# Patient Record
Sex: Female | Born: 1965
Health system: Southern US, Community
[De-identification: ages and names within clinical notes are randomized; demographics above are authoritative.]

## PROBLEM LIST (undated history)

## (undated) DIAGNOSIS — Z720 Tobacco use: Secondary | ICD-10-CM

## (undated) DIAGNOSIS — I639 Cerebral infarction, unspecified: Secondary | ICD-10-CM

## (undated) DIAGNOSIS — B1920 Unspecified viral hepatitis C without hepatic coma: Secondary | ICD-10-CM

## (undated) DIAGNOSIS — F1910 Other psychoactive substance abuse, uncomplicated: Secondary | ICD-10-CM

## (undated) HISTORY — PX: NO PAST SURGERIES: SHX2092

## (undated) HISTORY — DX: Unspecified viral hepatitis C without hepatic coma: B19.20

---

## 2013-01-02 ENCOUNTER — Emergency Department (HOSPITAL_COMMUNITY): Payer: Self-pay

## 2013-01-02 ENCOUNTER — Encounter (HOSPITAL_COMMUNITY): Payer: Self-pay | Admitting: *Deleted

## 2013-01-02 ENCOUNTER — Emergency Department (HOSPITAL_COMMUNITY)
Admission: EM | Admit: 2013-01-02 | Discharge: 2013-01-02 | Disposition: A | Discharge status: Home / Self Care | Payer: Self-pay | Attending: Emergency Medicine | Admitting: Emergency Medicine

## 2013-01-02 DIAGNOSIS — S4980XA Other specified injuries of shoulder and upper arm, unspecified arm, initial encounter: Secondary | ICD-10-CM | POA: Insufficient documentation

## 2013-01-02 DIAGNOSIS — Y929 Unspecified place or not applicable: Secondary | ICD-10-CM | POA: Insufficient documentation

## 2013-01-02 DIAGNOSIS — Y939 Activity, unspecified: Secondary | ICD-10-CM | POA: Insufficient documentation

## 2013-01-02 DIAGNOSIS — S46001A Unspecified injury of muscle(s) and tendon(s) of the rotator cuff of right shoulder, initial encounter: Secondary | ICD-10-CM

## 2013-01-02 DIAGNOSIS — F17200 Nicotine dependence, unspecified, uncomplicated: Secondary | ICD-10-CM | POA: Insufficient documentation

## 2013-01-02 DIAGNOSIS — M25511 Pain in right shoulder: Secondary | ICD-10-CM

## 2013-01-02 DIAGNOSIS — S46909A Unspecified injury of unspecified muscle, fascia and tendon at shoulder and upper arm level, unspecified arm, initial encounter: Secondary | ICD-10-CM | POA: Insufficient documentation

## 2013-01-02 DIAGNOSIS — M25519 Pain in unspecified shoulder: Secondary | ICD-10-CM | POA: Insufficient documentation

## 2013-01-02 DIAGNOSIS — X58XXXA Exposure to other specified factors, initial encounter: Secondary | ICD-10-CM | POA: Insufficient documentation

## 2013-01-02 MED ORDER — IBUPROFEN 400 MG PO TABS
800.0000 mg | ORAL_TABLET | Freq: Once | ORAL | Status: AC
  Administered 2013-01-02: 800 mg via ORAL
  Filled 2013-01-02: qty 2

## 2013-01-02 NOTE — ED Notes (Signed)
The pt just went to  xray 

## 2013-01-02 NOTE — Discharge Instructions (Signed)
Be sure to rest and ice your shoulder. Use the sling for comfort. Take ibuprofen between 608 100 mg every 6-8 hours. Followup with [facility]. Your x-rays today was normal, increasing my suspicion for rotator cuff injury.  Rotator Cuff Injury The rotator cuff is the collective set of muscles and tendons that make up the stabilizing unit of your shoulder. This unit holds in the ball of the humerus (upper arm bone) in the socket of the scapula (shoulder blade). Injuries to this stabilizing unit most commonly come from sports or activities that cause the arm to be moved repeatedly over the head. Examples of this include throwing, weight lifting, swimming, racquet sports, or an injury such as falling on your arm. Chronic (longstanding) irritation of this unit can cause inflammation (soreness), bursitis, and eventual damage to the tendons to the point of rupture (tear). An acute (sudden) injury of the rotator cuff can result in a partial or complete tear. You may need surgery with complete tears. Small or partial rotator cuff tears may be treated conservatively with temporary immobilization, exercises and rest. Physical therapy may be needed. HOME CARE INSTRUCTIONS   Apply ice to the injury for 15-20 minutes 3-4 times per day for the first 2 days. Put the ice in a plastic bag and place a towel between the bag of ice and your skin.  If you have a shoulder immobilizer (sling and straps), do not remove it for as long as directed by your caregiver or until you see a caregiver for a follow-up examination. If you need to remove it, move your arm as little as possible.  You may want to sleep on several pillows or in a recliner at night to lessen swelling and pain.  Only take over-the-counter or prescription medicines for pain, discomfort, or fever as directed by your caregiver.  Do simple hand squeezing exercises with a soft rubber ball to decrease hand swelling. SEEK MEDICAL CARE IF:   Pain in your  shoulder increases or new pain or numbness develops in your arm, hand, or fingers.  Your hand or fingers are colder than your other hand. SEEK IMMEDIATE MEDICAL CARE IF:   Your arm, hand, or fingers are numb or tingling.  Your arm, hand, or fingers are increasingly swollen and painful, or turn white or blue. Document Released: 06/16/2000 Document Revised: 09/11/2011 Document Reviewed: 06/09/2008 [facility] Patient Information 2014 [place], [place].  Shoulder Pain The shoulder is the joint that connects your arms to your body. The bones that form the shoulder joint include the upper arm bone (humerus), the shoulder blade (scapula), and the collarbone (clavicle). The top of the humerus is shaped like a ball and fits into a rather flat socket on the scapula (glenoid cavity). A combination of muscles and strong, fibrous tissues that connect muscles to bones (tendons) support your shoulder joint and hold the ball in the socket. Small, fluid-filled sacs (bursae) are located in different areas of the joint. They act as cushions between the bones and the overlying soft tissues and help reduce friction between the gliding tendons and the bone as you move your arm. Your shoulder joint allows a wide range of motion in your arm. This range of motion allows you to do things like scratch your back or throw a ball. However, this range of motion also makes your shoulder more prone to pain from overuse and injury. Causes of shoulder pain can originate from both injury and overuse and usually can be grouped in the following four  categories:  Redness, swelling, and pain (inflammation) of the tendon (tendinitis) or the bursae (bursitis).  Instability, such as a dislocation of the joint.  Inflammation of the joint (arthritis).  Broken bone (fracture). HOME CARE INSTRUCTIONS   Apply ice to the sore area.  Put ice in a plastic bag.  Place a towel between your skin and the bag.  Leave the ice on for 15-20  minutes, 3-4 times per day for the first 2 days.  Stop using cold packs if they do not help with the pain.  If you have a shoulder sling or immobilizer, wear it as long as your caregiver instructs. Only remove it to shower or bathe. Move your arm as little as possible, but keep your hand moving to prevent swelling.  Squeeze a soft ball or foam pad as much as possible to help prevent swelling.  Only take over-the-counter or prescription medicines for pain, discomfort, or fever as directed by your caregiver. SEEK MEDICAL CARE IF:   Your shoulder pain increases, or new pain develops in your arm, hand, or fingers.  Your hand or fingers become cold and numb.  Your pain is not relieved with medicines. SEEK IMMEDIATE MEDICAL CARE IF:   Your arm, hand, or fingers are numb or tingling.  Your arm, hand, or fingers are significantly swollen or turn white or blue. MAKE SURE YOU:   Understand these instructions.  Will watch your condition.  Will get help right away if you are not doing well or get worse. Document Released: 03/29/2005 Document Revised: 03/13/2012 Document Reviewed: 06/03/2011 [facility] Patient Information 2014 [place], [place].

## 2013-01-02 NOTE — ED Notes (Signed)
The pt has now gone to xray

## 2013-01-02 NOTE — ED Notes (Signed)
Pt waiting on xray  To be done

## 2013-01-02 NOTE — ED Provider Notes (Signed)
History  This chart was scribed for non-physician practitioner working with Flint Melter, MD by Greggory Stallion, ED scribe. This patient was seen in room TR05C/TR05C and the patient's care was started at 6:10 PM.  CSN: [id number] Arrival date & time 01/02/13  1801   Chief Complaint  Patient presents with  . Shoulder Pain   The history is provided by the patient. No language interpreter was used.    HPI Comments: Diana Harris is a 47 y.o. female who presents to the Emergency Department complaining of sharp right shoulder pain that started this morning that radiates down her arm. Pt rates her pain 10/10. She states the pain is worsened by movement. No known injury or trauma. She has not dont anything specific out of her normal. No heavy lifting. Pt denies CP, shortness of breath, lightheadedness, dizziness. She states she has taken Aleve for pain with no relief.  History reviewed. No pertinent past medical history. History reviewed. No pertinent past surgical history. History reviewed. No pertinent family history. History  Substance Use Topics  . Smoking status: Current Every Day Smoker    Types: Cigarettes  . Smokeless tobacco: Not on file  . Alcohol Use: Yes     Comment: occ beer   OB History   Grav Para Term Preterm Abortions TAB SAB Ect Mult Living                 Review of Systems  A complete 10 system review of systems was obtained and all systems are negative except as noted in the HPI and PMH.   Allergies  Review of patient's allergies indicates no known allergies.  Home Medications  No current outpatient prescriptions on file.  BP 131/82  Pulse 83  Temp(Src) 98.1 F (36.7 C) (Oral)  Resp 18  SpO2 98%  Physical Exam  Nursing note and vitals reviewed. Constitutional: She is oriented to person, place, and time. She appears well-developed and well-nourished. No distress.  HENT:  Head: Normocephalic and atraumatic.  Mouth/Throat: Oropharynx is clear and  moist.  Eyes: Conjunctivae and EOM are normal.  Neck: Normal range of motion. Neck supple.  Cardiovascular: Normal rate, regular rhythm, normal heart sounds and intact distal pulses.   Pulmonary/Chest: Effort normal and breath sounds normal. No respiratory distress.  Musculoskeletal: Normal range of motion. She exhibits no edema.  Generalized tenderness to palpation through right shoulder. Marked tenderness to insertion of biceps tendon. Positive impingement sign. Decreased ROM with flexion and abduction limited to 45 degrees due to pain. Equal strength, 5/5. Sensation intact. No deformity.   Neurological: She is alert and oriented to person, place, and time. No sensory deficit.  Skin: Skin is warm and dry.  Psychiatric: She has a normal mood and affect. Her behavior is normal.    ED Course  Procedures (including critical care time)  DIAGNOSTIC STUDIES: Oxygen Saturation is 98% on RA, normal by my interpretation.    COORDINATION OF CARE: 6:13 PM-Discussed treatment plan which includes shoulder xray with pt at bedside and pt agreed to plan.   Labs Reviewed - No data to display Dg Shoulder Right  01/02/2013   *RADIOLOGY REPORT*  Clinical Data: Right shoulder pain without known injury.  RIGHT SHOULDER - 2+ VIEW  Comparison: None.  Findings: Imaged bones, joints and soft tissues appear normal.  IMPRESSION: Normal study.   Original Report Authenticated By: Holley Dexter, M.D.   1. Shoulder pain, right   2. Rotator cuff injury, right, initial encounter  MDM  Right shoulder pain- worse with movement. Positive impingement sign. Generalized tenderness throughout right shoulder girdle. X-ray without any acute abnormality. Sling given for comfort. Advised her to rest and is her shoulder along with ibuprofen. She will followup with orthopedics for possible rotator cuff injury.       I personally performed the services described in this documentation, which was scribed in my presence.  The recorded information has been reviewed and is accurate.   Trevor Mace, PA-C 01/02/13 1942

## 2013-01-02 NOTE — ED Notes (Signed)
Pt reports waking up with right shoulder pain, increases with movement of her arm, denies injury.

## 2013-01-03 NOTE — ED Provider Notes (Signed)
Medical screening examination/treatment/procedure(s) were performed by non-physician practitioner and as supervising physician I was immediately available for consultation/collaboration.  Berkeley Veldman L Taraji Mungo, MD 01/03/13 0001 

## 2014-12-09 ENCOUNTER — Encounter (HOSPITAL_COMMUNITY): Payer: Self-pay | Admitting: *Deleted

## 2014-12-09 ENCOUNTER — Emergency Department (HOSPITAL_COMMUNITY)
Admission: EM | Admit: 2014-12-09 | Discharge: 2014-12-09 | Disposition: A | Discharge status: Home / Self Care | Payer: Self-pay | Attending: Emergency Medicine | Admitting: Emergency Medicine

## 2014-12-09 DIAGNOSIS — R6883 Chills (without fever): Secondary | ICD-10-CM | POA: Insufficient documentation

## 2014-12-09 DIAGNOSIS — M545 Low back pain, unspecified: Secondary | ICD-10-CM

## 2014-12-09 DIAGNOSIS — Z720 Tobacco use: Secondary | ICD-10-CM | POA: Insufficient documentation

## 2014-12-09 DIAGNOSIS — M791 Myalgia: Secondary | ICD-10-CM | POA: Insufficient documentation

## 2014-12-09 MED ORDER — CYCLOBENZAPRINE HCL 10 MG PO TABS: 10.0000 mg | ORAL_TABLET | Freq: Two times a day (BID) | ORAL | Status: DC | PRN

## 2014-12-09 MED ORDER — KETOROLAC TROMETHAMINE 60 MG/2ML IM SOLN
30.0000 mg | Freq: Once | INTRAMUSCULAR | Status: AC
  Administered 2014-12-09: 30 mg via INTRAMUSCULAR
  Filled 2014-12-09: qty 2

## 2014-12-09 NOTE — ED Notes (Signed)
Declined W/C at D/C and was escorted to lobby by RN. 

## 2014-12-09 NOTE — ED Notes (Signed)
Pt reports back pain to lower back with sharp pain down right leg, began two days ago. No known injury. No difficulty urinating. No hx of same.

## 2014-12-09 NOTE — Discharge Instructions (Signed)
Return to the emergency room with worsening of symptoms, new symptoms or with symptoms that are concerning , especially fevers, loss of control of bladder or bowels, numbness or tingling around genital region or anus, weakness. RICE: Rest, Ice (three cycles of 20 mins on, 20mins off at least twice a day), compression/brace, elevation. Heating pad works well for back pain. Ibuprofen 400mg  (2 tablets 200mg ) every 5-6 hours for 3-5 days. Flexeril for severe pain. Do not operate machinery, drive or drink alcohol while taking narcotics or muscle relaxers. Follow up with orthopedist if symptoms worsen or are persistent. Please call your doctor for a followup appointment within 24-48 hours. When you talk to your doctor please let them know that you were seen in the emergency department and have them acquire all of your records so that they can discuss the findings with you and formulate a treatment plan to fully care for your new and ongoing problems. If you do not have a primary care provider please call the number below under ED resources to establish care with a provider and follow up.    Emergency Department Resource Guide 1) Find a Doctor and Pay Out of Pocket Although you won't have to find out who is covered by your insurance plan, it is a good idea to ask around and get recommendations. You will then need to call the office and see if the doctor you have chosen will accept you as a new patient and what types of options they offer for patients who are self-pay. Some doctors offer discounts or will set up payment plans for their patients who do not have insurance, but you will need to ask so you aren't surprised when you get to your appointment.  2) Contact Your Local Health Department Not all health departments have doctors that can see patients for sick visits, but many do, so it is worth a call to see if yours does. If you don't know where your local health department is, you can check in your phone  book. The CDC also has a tool to help you locate your state's health department, and many state websites also have listings of all of their local health departments.  3) Find a Walk-in Clinic If your illness is not likely to be very severe or complicated, you may want to try a walk in clinic. These are popping up all over the country in pharmacies, drugstores, and shopping centers. They're usually staffed by nurse practitioners or physician assistants that have been trained to treat common illnesses and complaints. They're usually fairly quick and inexpensive. However, if you have serious medical issues or chronic medical problems, these are probably not your best option.  No Primary Care Doctor: - Call Health Connect at  [phone] - they can help you locate a primary care doctor that  accepts your insurance, provides certain services, etc. - Physician Referral Service- [phone]  Chronic Pain Problems: Organization         Address  Phone   Notes  [facility]  [phone] Patients need to be referred by their primary care doctor.   Medication Assistance: Organization         Address  Phone   Notes  [facility] Medication [facility] [address] [place], [place] [phone] --Must be a resident of [place] [facility] [place] -- Must have NO insurance coverage whatsoever (no Medicaid/ Medicare, etc.) -- The pt. MUST have a primary care doctor that directs their  care regularly and follows them in the community   MedAssist  [phone]   [employer]  [phone]    Agencies that provide inexpensive medical care: Organization         Address  Phone   Notes  [place]  [phone]   [facility]    [phone]   [facility] [place], [place] [address] [phone]   [place] [address] [place] [phone]   [facility]  [facility]    [phone]   [facility]    [phone]   [place] and [address], [place] Phone:  [phone], Fax:  [phone] Hours of Operation:  9 am - 6 pm, M-F.  Also accepts Medicaid/Medicare and self-pay.  [facility] [place], [address], [place] Phone: [phone], Fax: [phone]. Hours of Operation:  8:30 am - 5:30 pm, M-F.  Also accepts Medicaid and self-pay.  [facility] [place] [address] [place] Phone: [phone]   [place], [place], [place] [phone], Ext. [phone] Mondays & Thursdays: 7-9 AM.  First 15 patients are seen on a first come, first serve basis.    [place] Providers:  Organization         Address  Phone   Notes  [facility] [address], [place] [phone] Also accepts self-pay patients.  [facility] [address], [place]  [phone]   [place], [address], [place] [phone]   [facility] [address] [place] [phone]   Lucianne Lei [address], [place]   [phone] Only accepts [place] Access [place] patients after they have their name applied to their card.   Self-Pay (no insurance) in [facility]:  Organization         Address  Phone   Notes  Sickle Cell Patients, [facility] [place] [phone]   [facility] [place] [phone]   [facility] [place], [place], [place] [phone]   [facility]/Dr. Osei-Bonsu  [address], [place] or [address], [place] [phone] Phone number for both [place] and [place] locations is the same.    Urgent Medical and [facility] [address] [place] [phone]   [facility] [address], [place] or [address] [phone]   [facility] [address], [place] [phone], phone; [phone], fax Sees patients 1st and 3rd Saturday of every month.  Must not qualify for public or private insurance (i.e. Medicaid, Medicare, [place] Health Choice, Veterans' Benefits)  Household income should be no more than 200% of the poverty level The clinic cannot treat you if you are pregnant or think you are pregnant  Sexually transmitted diseases are not treated at the clinic.    Dental Care: Organization         Address  Phone  Notes  [facility] [address] [place], [place] [phone] Accepts children up to  age 24 who are enrolled in Medicaid or [place] Health Choice; pregnant women with a Medicaid card; and children who have applied for Medicaid or [place] Health Choice, but were declined, whose parents can pay a reduced fee at time of service.  [facility]  [address], [place] [phone] Accepts children up to age 27 who are enrolled in [place] or [place]; pregnant women with a Medicaid card; and children who have applied for Medicaid or [place] Health Choice, but were declined, whose parents can pay a reduced fee at time of service.  [place] Adult Dental Access PROGRAM  [place] [phone] Patients are seen by appointment only. Walk-ins are not accepted. [place] will see patients 59 years of age and older. Monday - Tuesday (8am-5pm) Most Wednesdays (8:30-5pm) $30 per visit, cash only  [facility] Adult Dental Access PROGRAM  [address], [facility] [phone] Patients are seen by appointment only. Walk-ins are not accepted. [place] will see patients 45  years of age and older. One Wednesday Evening (Monthly: Volunteer Based).  $30 per visit, cash only  [place]  [phone] for adults; Children under age 24, call Graduate Pediatric Dentistry at [phone]. Children aged 27-14, please call [phone] to request a pediatric application.  Dental services are provided in all areas of dental care including fillings, crowns and bridges, complete and partial dentures, implants, gum treatment, root canals, and extractions. Preventive care is also provided. Treatment is provided to both adults and children. Patients are selected via a lottery and there is often a waiting list.   [facility] [address], [place]  [phone] [email or web address]   [facility] [address] [place], [place] [phone], Ext. [phone] Second and Fourth Thursday of each month, opens at 6:30 AM; Clinic ends at 9 AM.  Patients are seen on a first-come first-served basis, and a limited number are seen during each clinic.   [facility]  [address] Hillard Danker [place], [place] [phone]   Eligibility Requirements You must have lived in [place], [place], or [place] counties for at least the last three months.   You cannot be eligible for state or federal sponsored [employer], including [employer], [place], or [employer].   You generally cannot be eligible for healthcare insurance through your employer.    How to apply: Eligibility screenings are held every Tuesday and Wednesday afternoon from 1:00 pm until 4:00 pm. You do not need an appointment for the interview!  [facility] [address] [place], [place]   [place]  [place] Department  [place]  [phone]    Behavioral Health Resources in the Community: Intensive Outpatient  Programs Organization         Address  Phone  Notes  [place]. [address] [place], [place] [phone]   [facility] [address], [place], [place]   ADS: Alcohol & Drug Svcs [address] [place], [place]   [place] [address]  [place], [place] or [phone]   Substance Abuse Resources Organization         Address  Phone  Notes  Alcohol and Drug Services  [phone]   [facility]ates  [phone]   [facility] [place]   Chinita Pester  [phone]   Residential & Outpatient Substance Abuse Program  [phone]   Psychological Services Organization         Address  Phone  Notes  [facility] [place]  [place]  [phone]   [place] [address] [place] or [phone]    Mobile Crisis Teams Organization         Address  Phone  Notes  [facility], Mobile Crisis Care Unit  [phone]   [facility]  [address] [place], [place]   [place] [address], [place] [phone]    Self-Help/Support Groups Organization         Address  Phone             Notes  [place]. [facility] [place] - variety of support groups  [place] Call for more information  Narcotics Anonymous (NA), [facility] [address], [employer] [place]  2 meetings at this location   Special educational needs teacher         Address  Phone  Notes  ASAP Residential Treatment [place],    [place]  [phone]   [facility]  [address], [place] [phone], [place], [place]   [place], [place] [phone] Admissions: 8am-3pm M-F  Incentives Substance [place] [address],    [place], [place]  [phone]   [facility] [address] [place], [place], [place]   [facility] [address]  [place], [place]   [facility]ight Programs - Intensive Outpatient [address], [place], [place]   [facility].,  [place], [place] [phone] or [phone]   [facility] Services (RTS) [address], [place], [place] Accepts Medicaid  [facility] [place] [address]  [place] [phone] Substance Abuse/Addiction Treatment   [facility] Organization         Address  Phone  Notes  [facility]  [phone]   Domenic Schwab, PhD [address] [place], [place]   [phone] or [phone]   [place], [place] [phone]   [facility] [address] [place], [place] [phone] Insurance/Medicaid/sponsorship through [employer] and Families [address] [place]                                    [place], [place] [phone] [place] [address], [place] [phone]    Dr. Adele Schilder  [phone]   [facility] Dept. 1) [address], [place] 2) [place] 3)  [place] [address], [place] [phone]  [phone]   [place] [phone] or [phone] (After Hours)

## 2014-12-09 NOTE — ED Provider Notes (Signed)
CSN: [id number]     Arrival date & time 12/09/14  4098 History  This chart was scribed for non-physician practitioner, Oswaldo Conroy, PA-C working with Samuel Jester, DO by Placido Sou, ED scribe. This patient was seen in room TR09C/TR09C and the patient's care was started at 9:17 AM.     Chief Complaint  Patient presents with  . Back Pain    The history is provided by the patient. No language interpreter was used.    HPI Comments: [facility] is a 49 y.o. female who presents to the Emergency Department complaining of constant, mild, radiating, generalized body aches worse in left lower back with onset 2 days ago that shoots down left leg and all over body. Pt notes worsening symptoms when ambulating but denies any falls or recent trauma. She additionally notes taking Aleve and icy hot for pain with no relief of symptoms. Pt is employed as a Financial trader. Pt denies, fever, diaphoresis, IVDA, numbness, tingling, and incontinence of bladder or bowels. No saddle anesthesia  History reviewed. No pertinent past medical history. History reviewed. No pertinent past surgical history. No family history on file. History  Substance Use Topics  . Smoking status: Current Every Day Smoker    Types: Cigarettes  . Smokeless tobacco: Not on file  . Alcohol Use: Yes     Comment: occ beer   OB History    No data available     Review of Systems  Constitutional: Positive for chills. Negative for fever and diaphoresis.  Genitourinary: Negative for urgency and frequency.       - negative for incontinence of bowels or bladder   Musculoskeletal: Positive for myalgias and back pain.  Neurological: Negative for weakness and numbness.       - negative tingling      Allergies  Review of patient's allergies indicates no known allergies.  Home Medications   Prior to Admission medications   Medication Sig Start Date End Date Taking? Authorizing Provider  cyclobenzaprine (FLEXERIL) 10 MG  tablet Take 1 tablet (10 mg total) by mouth 2 (two) times daily as needed for muscle spasms. 12/09/14   Oswaldo Conroy, PA-C  naproxen sodium (ANAPROX) 220 MG tablet Take 440 mg by mouth 2 (two) times daily as needed (for pain).    Historical Provider, MD   BP 167/95 mmHg  Pulse 77  Temp(Src) 98.2 F (36.8 C) (Oral)  Resp 16  SpO2 99% Physical Exam  Constitutional: She appears well-developed and well-nourished. No distress.  HENT:  Head: Normocephalic and atraumatic.  Eyes: Conjunctivae are normal. Right eye exhibits no discharge. Left eye exhibits no discharge.  Cardiovascular: Normal rate, regular rhythm and normal heart sounds.   Pulmonary/Chest: Effort normal and breath sounds normal. No respiratory distress. She has no wheezes.  Abdominal: Soft. Bowel sounds are normal. She exhibits no distension. There is no tenderness.  Musculoskeletal:  No midline back tenderness, step off or crepitus. Left sided lower back tenderness. No CVA tenderness.   Neurological: She is alert. Coordination normal.  Equal muscle tone. 5/5 strength in lower extremities. DTR equal and intact. Negative straight leg test. Antalgic gait.   Skin: Skin is warm and dry. She is not diaphoretic.  Nursing note and vitals reviewed.   ED Course  Procedures  DIAGNOSTIC STUDIES: Oxygen Saturation is 99% on RA, normal by my interpretation.    COORDINATION OF CARE: 9:23 AM Discussed treatment plan with pt at bedside including recommended movement and strengthening exercises as well as  an anti-inflammatory injection and a muscle relaxation prescription and pt agreed to plan.  Labs Review Labs Reviewed - No data to display  Imaging Review No results found.   EKG Interpretation None      MDM   Final diagnoses:  Left-sided low back pain without sciatica   Patient with back pain. No loss of bowel or bladder control. No saddle anesthesia. No fever, night sweats, weight loss, h/o cancer, IVDU. VSS. No  neurological deficits and normal neuro exam. Patient can walk but states is painful. No concern for cauda equina.  RICE protocol and flexeril indicated and discussed with patient. Driving and sedation precautions provided. Pt instructed on abdominal strength exercises and back injury prevention. She works Education officer, environmentalcleaning houses. Patient is afebrile, nontoxic, and in no acute distress. Patient is appropriate for outpatient management and is stable for discharge.  Discussed return precautions with patient. Discussed all results and patient verbalizes understanding and agrees with plan.  I personally performed the services described in this documentation, which was scribed in my presence. The recorded information has been reviewed and is accurate.    Oswaldo ConroyVictoria Gaila Engebretsen, PA-C 12/09/14 0945  Samuel JesterKathleen McManus, DO 12/10/14 1557

## 2015-10-06 ENCOUNTER — Emergency Department (HOSPITAL_COMMUNITY)
Admission: EM | Admit: 2015-10-06 | Discharge: 2015-10-06 | Disposition: A | Discharge status: Home / Self Care | Payer: Self-pay | Attending: Emergency Medicine | Admitting: Emergency Medicine

## 2015-10-06 ENCOUNTER — Encounter (HOSPITAL_COMMUNITY): Payer: Self-pay | Admitting: Family Medicine

## 2015-10-06 DIAGNOSIS — F17210 Nicotine dependence, cigarettes, uncomplicated: Secondary | ICD-10-CM | POA: Insufficient documentation

## 2015-10-06 DIAGNOSIS — M7989 Other specified soft tissue disorders: Secondary | ICD-10-CM | POA: Insufficient documentation

## 2015-10-06 DIAGNOSIS — M79645 Pain in left finger(s): Secondary | ICD-10-CM | POA: Insufficient documentation

## 2015-10-06 MED ORDER — HYDROCODONE-ACETAMINOPHEN 5-325 MG PO TABS
1.0000 | ORAL_TABLET | Freq: Once | ORAL | Status: AC
  Administered 2015-10-06: 1 via ORAL
  Filled 2015-10-06: qty 1

## 2015-10-06 MED ORDER — CEPHALEXIN 500 MG PO CAPS: 1000.0000 mg | ORAL_CAPSULE | Freq: Two times a day (BID) | ORAL | Status: DC

## 2015-10-06 MED ORDER — HYDROCODONE-ACETAMINOPHEN 5-325 MG PO TABS: 1.0000 | ORAL_TABLET | Freq: Four times a day (QID) | ORAL | Status: DC | PRN

## 2015-10-06 NOTE — ED Notes (Signed)
Pt here for left thumb swelling since yesterday. Denies injury.

## 2015-10-06 NOTE — ED Notes (Signed)
Pt ambulated to room 

## 2015-10-06 NOTE — ED Provider Notes (Signed)
CSN: [id number]     Arrival date & time 10/06/15  1305 History  By signing my name below, I, Freida BusmanDiana Omoyeni, attest that this documentation has been prepared under the direction and in the presence of non-physician practitioner, Fayrene HelperBowie Garrie Woodin, PA-C. Electronically Signed: Freida Busmaniana Omoyeni, Scribe. 10/06/2015. 2:29 PM.    Chief Complaint  Patient presents with  . Hand Pain     The history is provided by the patient. No language interpreter was used.     HPI Comments:  Diana Harris is a 50 y.o. female who presents to the Emergency Department complaining of throbbing, left thumb pain that she woke up with 2 days ago. She denies injury and h/o similar symptom. 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 No alleviating factors noted. Pt is a current everyday smoker. She denies h/o DM. Pt is right hand dominant.  No hx of gout   History reviewed. No pertinent past medical history. History reviewed. No pertinent past surgical history. History reviewed. No pertinent family history. Social History  Substance Use Topics  . Smoking status: Current Every Day Smoker    Types: Cigarettes  . Smokeless tobacco: None  . Alcohol Use: Yes     Comment: occ beer   OB History    No data available     Review of Systems  Musculoskeletal: Positive for joint swelling and arthralgias.    Allergies  Review of patient's allergies indicates no known allergies.  Home Medications   Prior to Admission medications   Medication Sig Start Date End Date Taking? Authorizing Provider  cyclobenzaprine (FLEXERIL) 10 MG tablet Take 1 tablet (10 mg total) by mouth 2 (two) times daily as needed for muscle spasms. 12/09/14   Oswaldo ConroyVictoria Creech, PA-C  naproxen sodium (ANAPROX) 220 MG tablet Take 440 mg by mouth 2 (two) times daily as needed (for pain).    Historical Provider, MD   BP 141/98 mmHg  Pulse 80  Temp(Src) 98 F (36.7 C) (Oral)  Resp 18  Ht 5\' 2"  (1.575 m)  Wt 128  lb 9.6 oz (58.333 kg)  BMI 23.52 kg/m2  SpO2 97% Physical Exam  Constitutional: She is oriented to person, place, and time. She appears well-developed and well-nourished. No distress.  HENT:  Head: Normocephalic and atraumatic.  Eyes: Conjunctivae are normal.  Cardiovascular: Normal rate.   Pulmonary/Chest: Effort normal.  Abdominal: She exhibits no distension.  Musculoskeletal:  Left hand: moderate swelling noted through left thumb with mild erythema and decreased flexion to IP joint  Brisk cap refill  No nail involvement No obvious abscess noted.   Neurological: She is alert and oriented to person, place, and time.  Skin: Skin is warm and dry.  Psychiatric: She has a normal mood and affect.  Nursing note and vitals reviewed.   ED Course  Procedures   DIAGNOSTIC STUDIES:  Oxygen Saturation is 97% on RA, normal by my interpretation.    COORDINATION OF CARE:  2:25 PM Discussed treatment plan with pt at bedside and pt agreed to plan.    MDM   Patient with pain and swelling to the left thumb.  Suspect cellulitis of thumb, potential joint involvement however swelling is throughout finger and not localized to just the joint.  No obvious abscess noted, no evidence of paronychia or felon noted.  No hx of gout.  This could be cellulitis 2/2 potential insect bite.  No injury to suggest fx/dislocation. Pt is  afebrile. No tachycardia, hypotension or other symptoms suggestive of severe infection.  pt advised to follow up for wound check in 2-3 days, sooner for worsening systemic symptoms, new lymphangitis, or significant spread of erythema past line of demarcation. Will discharge with bactrim/keflex and pain medication. Return precautions discussed. Pt appears safe for discharge.   Final diagnoses:  Thumb pain, left    BP 141/98 mmHg  Pulse 80  Temp(Src) 98 F (36.7 C) (Oral)  Resp 18  Ht  (1.575 m)  Wt 58.333 kg  BMI 23.52 kg/m2  SpO2 97%  I personally performed the  services described in this documentation, which was scribed in my presence. The recorded information has been reviewed and is accurate.      Fayrene Helper, PA-C 10/06/15 1457  Margarita Grizzle, MD 10/06/15 1540

## 2015-10-06 NOTE — ED Notes (Signed)
Patient able to ambulate independently  

## 2015-10-06 NOTE — Discharge Instructions (Signed)
Your thumb pain may be due to an infection.  Take antibiotic and pain medication as prescribed. Return in 48 hrs for recheck if no improvement.    Cellulitis Cellulitis is an infection of the skin and the tissue beneath it. The infected area is usually red and tender. Cellulitis occurs most often in the arms and lower legs.  CAUSES  Cellulitis is caused by bacteria that enter the skin through cracks or cuts in the skin. The most common types of bacteria that cause cellulitis are staphylococci and streptococci. SIGNS AND SYMPTOMS   Redness and warmth.  Swelling.  Tenderness or pain.  Fever. DIAGNOSIS  Your health care provider can usually determine what is wrong based on a physical exam. Blood tests may also be done. TREATMENT  Treatment usually involves taking an antibiotic medicine. HOME CARE INSTRUCTIONS   Take your antibiotic medicine as directed by your health care provider. Finish the antibiotic even if you start to feel better.  Keep the infected arm or leg elevated to reduce swelling.  Apply a warm cloth to the affected area up to 4 times per day to relieve pain.  Take medicines only as directed by your health care provider.  Keep all follow-up visits as directed by your health care provider. SEEK MEDICAL CARE IF:   You notice red streaks coming from the infected area.  Your red area gets larger or turns dark in color.  Your bone or joint underneath the infected area becomes painful after the skin has healed.  Your infection returns in the same area or another area.  You notice a swollen bump in the infected area.  You develop new symptoms.  You have a fever. SEEK IMMEDIATE MEDICAL CARE IF:   You feel very sleepy.  You develop vomiting or diarrhea.  You have a general ill feeling (malaise) with muscle aches and pains.   This information is not intended to replace advice given to you by your health care provider. Make sure you discuss any questions you have  with your health care provider.   Document Released: 03/29/2005 Document Revised: 03/10/2015 Document Reviewed: 09/04/2011 Elsevier Interactive Patient Education [employer]

## 2017-04-19 ENCOUNTER — Emergency Department (HOSPITAL_COMMUNITY)
Admission: EM | Admit: 2017-04-19 | Discharge: 2017-04-19 | Disposition: A | Discharge status: Home / Self Care | Payer: Self-pay | Attending: Emergency Medicine | Admitting: Emergency Medicine

## 2017-04-19 ENCOUNTER — Emergency Department (HOSPITAL_COMMUNITY): Payer: Self-pay

## 2017-04-19 DIAGNOSIS — F17210 Nicotine dependence, cigarettes, uncomplicated: Secondary | ICD-10-CM | POA: Insufficient documentation

## 2017-04-19 DIAGNOSIS — M7918 Myalgia, other site: Secondary | ICD-10-CM

## 2017-04-19 DIAGNOSIS — M79622 Pain in left upper arm: Secondary | ICD-10-CM | POA: Insufficient documentation

## 2017-04-19 DIAGNOSIS — Z79899 Other long term (current) drug therapy: Secondary | ICD-10-CM | POA: Insufficient documentation

## 2017-04-19 DIAGNOSIS — M25611 Stiffness of right shoulder, not elsewhere classified: Secondary | ICD-10-CM | POA: Insufficient documentation

## 2017-04-19 MED ORDER — CYCLOBENZAPRINE HCL 10 MG PO TABS
10.0000 mg | ORAL_TABLET | Freq: Once | ORAL | Status: AC
  Administered 2017-04-19: 10 mg via ORAL
  Filled 2017-04-19: qty 1

## 2017-04-19 MED ORDER — CYCLOBENZAPRINE HCL 10 MG PO TABS: 10.0000 mg | ORAL_TABLET | Freq: Two times a day (BID) | ORAL | 0 refills | Status: DC | PRN

## 2017-04-19 MED ORDER — LIDOCAINE 5 % EX PTCH
1.0000 | MEDICATED_PATCH | CUTANEOUS | Status: DC
  Administered 2017-04-19: 1 via TRANSDERMAL
  Filled 2017-04-19: qty 1

## 2017-04-19 NOTE — ED Notes (Signed)
Declined W/C at D/C and was escorted to lobby by RN. 

## 2017-04-19 NOTE — Discharge Instructions (Signed)
For pain control you may take up to 800mg of ibuprofen (that is usually 4 over the counter pills)  3 times a day (take with food) and acetaminophen 975mg (this is 3 over the counter pills) four times a day. Do not drink alcohol or combine with other medications that have acetaminophen as an ingredient (Read the labels!).  ° ° For breakthrough pain you may take Flexeril. Do not drink alcohol, drive or operate heavy machinery when taking Flexeril. ° °Do not hesitate to return to the emergency room for any new, worsening or concerning symptoms. ° °Please obtain primary care using resource guide below. Let them know that you were seen in the emergency room and that they will need to obtain records for further outpatient management. ° ° °

## 2017-04-19 NOTE — ED Provider Notes (Signed)
[facility] Provider Note   CSN: [id number] Arrival date & time: 04/19/17  1442     History   Chief Complaint Chief Complaint  Patient presents with  . Shoulder Pain     HPI   Blood pressure (!) 146/89, pulse 70, temperature 98.3 F (36.8 C), temperature source Oral, height 5\' 2"  (1.575 m), weight 56.7 kg (125 lb), SpO2 98 %.  [facility] is a 51 y.o. female complaining of Left-sided biceps pain after waking this morning, she states that the pain is severe and exacerbated with movement and palpation. There was no trauma or abnormal physical exertion. She has no associated chest pain, shortness of breath. She took 2 ibuprofen this morning with little relief. Patient is right-hand-dominant. Denies rash, swelling, history of clotting disorders or the VTE.  No past medical history on file.  There are no active problems to display for this patient.   No past surgical history on file.  OB History    No data available       Home Medications    Prior to Admission medications   Medication Sig Start Date End Date Taking? Authorizing Provider  cephALEXin (KEFLEX) 500 MG capsule Take 2 capsules (1,000 mg total) by mouth 2 (two) times daily. 10/06/15   Fayrene Helper, PA-C  cyclobenzaprine (FLEXERIL) 10 MG tablet Take 1 tablet (10 mg total) by mouth 2 (two) times daily as needed for muscle spasms. 04/19/17   Harlin Mazzoni, Joni Reining, PA-C  HYDROcodone-acetaminophen (NORCO/VICODIN) 5-325 MG tablet Take 1 tablet by mouth every 6 (six) hours as needed for moderate pain. 10/06/15   Fayrene Helper, PA-C  naproxen sodium (ANAPROX) 220 MG tablet Take 440 mg by mouth 2 (two) times daily as needed (for pain).    [provider]    Family History No family history on file.  Social History Social History  Substance Use Topics  . Smoking status: Current Every Day Smoker    Types: Cigarettes  . Smokeless tobacco: Not on file  . Alcohol use Yes   Comment: occ beer     Allergies   Patient has no known allergies.   Review of Systems Review of Systems  A complete review of systems was obtained and all systems are negative except as noted in the HPI and PMH.    Physical Exam Updated Vital Signs BP (!) 146/89 (BP Location: Right Arm)   Pulse 70   Temp 98.3 F (36.8 C) (Oral)   Ht 5\' 2"  (1.575 m)   Wt 56.7 kg (125 lb)   SpO2 98%   BMI 22.86 kg/m   Physical Exam  Constitutional: She is oriented to person, place, and time. She appears well-developed and well-nourished. No distress.  HENT:  Head: Normocephalic and atraumatic.  Mouth/Throat: Oropharynx is clear and moist.  Eyes: Pupils are equal, round, and reactive to light. Conjunctivae and EOM are normal.  Neck: Normal range of motion.  Cardiovascular: Normal rate, regular rhythm and intact distal pulses.   Pulmonary/Chest: Effort normal and breath sounds normal.  Abdominal: Soft. There is no tenderness.  Musculoskeletal:  Left bicep with tenderness to palpation, no rash, no swelling or edema, no skin changes distally neurovascularly intact, patient with reduced range of motion in shoulder abduction. No focal bony tenderness along the shoulder.  Neurological: She is alert and oriented to person, place, and time.  Skin: She is not diaphoretic.  Psychiatric: She has a normal mood and affect.  Nursing note and vitals reviewed.  ED Treatments / Results  Labs (all labs ordered are listed, but only abnormal results are displayed) Labs Reviewed - No data to display  EKG  EKG Interpretation None       Radiology Dg Shoulder Left  Result Date: 04/19/2017 CLINICAL DATA:  Acute left shoulder pain without known injury. EXAM: LEFT SHOULDER - 2+ VIEW COMPARISON:  None. FINDINGS: There is no evidence of fracture or dislocation. There is no evidence of arthropathy or other focal bone abnormality. Soft tissues are unremarkable. IMPRESSION: Normal left shoulder.  Electronically Signed   By: Lupita RaiderJames  Green Jr, M.D.   On: 04/19/2017 17:01    Procedures Procedures (including critical care time)  Medications Ordered in ED Medications  lidocaine (LIDODERM) 5 % 1 patch (not administered)  cyclobenzaprine (FLEXERIL) tablet 10 mg (not administered)     Initial Impression / Assessment and Plan / ED Course  I have reviewed the triage vital signs and the nursing notes.  Pertinent labs & imaging results that were available during my care of the patient were reviewed by me and considered in my medical decision making (see chart for details).     Vitals:   04/19/17 1524 04/19/17 1525  BP:  (!) 146/89  Pulse:  70  Temp:  98.3 F (36.8 C)  TempSrc:  Oral  SpO2:  98%  Weight: 56.7 kg (125 lb)   Height: 5\' 2"  (1.575 m)     Medications  lidocaine (LIDODERM) 5 % 1 patch (not administered)  cyclobenzaprine (FLEXERIL) tablet 10 mg (not administered)    [facility] is 51 y.o. female presenting with Atraumatic left arm pain when waking this morning. It is along the left bicep. No overlying rash, no swelling, I doubt this is a DVT. She is distally neurovascular intact, there is no focal bony tenderness along the shoulder. Given the exacerbation of her pain with movement and palpation of bleed just be a musculoskeletal pain. I doubt this is an anginal equivalent. She has no associated chest pain, shortness of breath, diaphoresis or exertional component. Patient will be given Lidoderm patch, Flexeril.   Evaluation does not show pathology that would require ongoing emergent intervention or inpatient treatment. Pt is hemodynamically stable and mentating appropriately. Discussed findings and plan with patient/guardian, who agrees with care plan. All questions answered. Return precautions discussed and outpatient follow up given.      Final Clinical Impressions(s) / ED Diagnoses   Final diagnoses:  Musculoskeletal pain    New Prescriptions New  Prescriptions   CYCLOBENZAPRINE (FLEXERIL) 10 MG TABLET    Take 1 tablet (10 mg total) by mouth 2 (two) times daily as needed for muscle spasms.     Kaylyn Limisciotta, Sadie Hazelett, PA-C 04/19/17 1736    Lavera GuiseLiu, Dana Duo, MD 04/21/17 [facility] Manners0025

## 2017-04-19 NOTE — ED Triage Notes (Signed)
Pt to ED for evaluation of L shoulder pain that started this morning. Pt denies injury. Limited movement, pain worse with movement. NAD at this time. Denies numbness or tingling. Able to hold objects but has pain when lifting past 45 degrees.

## 2017-08-21 ENCOUNTER — Emergency Department (HOSPITAL_COMMUNITY): Payer: Self-pay

## 2017-08-21 ENCOUNTER — Encounter (HOSPITAL_COMMUNITY): Payer: Self-pay | Admitting: *Deleted

## 2017-08-21 ENCOUNTER — Emergency Department (HOSPITAL_COMMUNITY)
Admission: EM | Admit: 2017-08-21 | Discharge: 2017-08-21 | Disposition: A | Discharge status: Home / Self Care | Payer: Self-pay | Attending: Emergency Medicine | Admitting: Emergency Medicine

## 2017-08-21 ENCOUNTER — Other Ambulatory Visit: Payer: Self-pay

## 2017-08-21 DIAGNOSIS — I639 Cerebral infarction, unspecified: Secondary | ICD-10-CM | POA: Insufficient documentation

## 2017-08-21 DIAGNOSIS — F17210 Nicotine dependence, cigarettes, uncomplicated: Secondary | ICD-10-CM | POA: Insufficient documentation

## 2017-08-21 LAB — COMPREHENSIVE METABOLIC PANEL
ALT: 102 U/L — ABNORMAL HIGH (ref 14–54)
AST: 78 U/L — AB (ref 15–41)
Albumin: 3.9 g/dL (ref 3.5–5.0)
Alkaline Phosphatase: 71 U/L (ref 38–126)
Anion gap: 9 (ref 5–15)
BUN: 12 mg/dL (ref 6–20)
CHLORIDE: 106 mmol/L (ref 101–111)
CO2: 25 mmol/L (ref 22–32)
Calcium: 9.4 mg/dL (ref 8.9–10.3)
Creatinine, Ser: 0.72 mg/dL (ref 0.44–1.00)
Glucose, Bld: 90 mg/dL (ref 65–99)
Potassium: 4.1 mmol/L (ref 3.5–5.1)
SODIUM: 140 mmol/L (ref 135–145)
Total Bilirubin: 0.8 mg/dL (ref 0.3–1.2)
Total Protein: 7.1 g/dL (ref 6.5–8.1)

## 2017-08-21 LAB — CBC WITH DIFFERENTIAL/PLATELET
BASOS ABS: 0 10*3/uL (ref 0.0–0.1)
Basophils Relative: 0 %
EOS ABS: 0.1 10*3/uL (ref 0.0–0.7)
Eosinophils Relative: 2 %
HCT: 41.3 % (ref 36.0–46.0)
Hemoglobin: 14.9 g/dL (ref 12.0–15.0)
LYMPHS PCT: 51 %
Lymphs Abs: 1.3 10*3/uL (ref 0.7–4.0)
MCH: 33.8 pg (ref 26.0–34.0)
MCHC: 36.1 g/dL — ABNORMAL HIGH (ref 30.0–36.0)
MCV: 93.7 fL (ref 78.0–100.0)
Monocytes Absolute: 0.3 10*3/uL (ref 0.1–1.0)
Monocytes Relative: 11 %
NEUTROS ABS: 1 10*3/uL — AB (ref 1.7–7.7)
NEUTROS PCT: 36 %
PLATELETS: 177 10*3/uL (ref 150–400)
RBC: 4.41 MIL/uL (ref 3.87–5.11)
RDW: 13.4 % (ref 11.5–15.5)
SMEAR REVIEW: ADEQUATE
WBC: 2.7 10*3/uL — AB (ref 4.0–10.5)

## 2017-08-21 LAB — C-REACTIVE PROTEIN

## 2017-08-21 LAB — SEDIMENTATION RATE: Sed Rate: 6 mm/hr (ref 0–22)

## 2017-08-21 MED ORDER — ASPIRIN 325 MG PO TABS
325.0000 mg | ORAL_TABLET | Freq: Every day | ORAL | Status: DC
  Administered 2017-08-21: 325 mg via ORAL
  Filled 2017-08-21: qty 1

## 2017-08-21 MED ORDER — DIPHENHYDRAMINE HCL 25 MG PO CAPS
25.0000 mg | ORAL_CAPSULE | Freq: Once | ORAL | Status: AC
  Administered 2017-08-21: 25 mg via ORAL
  Filled 2017-08-21: qty 1

## 2017-08-21 MED ORDER — SODIUM CHLORIDE 0.9 % IV BOLUS (SEPSIS)
500.0000 mL | Freq: Once | INTRAVENOUS | Status: AC
  Administered 2017-08-21: 500 mL via INTRAVENOUS

## 2017-08-21 MED ORDER — METOCLOPRAMIDE HCL 5 MG/ML IJ SOLN
10.0000 mg | Freq: Once | INTRAMUSCULAR | Status: AC
Start: 2017-08-21 — End: 2017-08-21
  Administered 2017-08-21: 10 mg via INTRAVENOUS
  Filled 2017-08-21: qty 2

## 2017-08-21 MED ORDER — GADOBENATE DIMEGLUMINE 529 MG/ML IV SOLN: 10.0000 mL | Freq: Once | INTRAVENOUS | Status: DC | PRN

## 2017-08-21 NOTE — ED Notes (Signed)
Pt in MRI at this time 

## 2017-08-21 NOTE — ED Notes (Signed)
Patient remains in MRI at this time.

## 2017-08-21 NOTE — Discharge Instructions (Signed)
Your tests today show that you have had a stroke or possibly inflammation in the blood vessels in the back of your brain.  This is something that he should be admitted to the hospital for tonight, as we discussed.  Please return to the hospital as soon as possible for admission.  It is possible that you could develop more severe or worsening stroke symptoms that could leave you permanently paralyzed, blind, or possibly result in death.

## 2017-08-21 NOTE — ED Triage Notes (Signed)
Pt states 3 days ago she suddenly developed blurry vision with blue spots in both eyes and sometimes sees double. Pt also reports a headache x3 days.

## 2017-08-21 NOTE — ED Notes (Signed)
ED Provider at bedside. 

## 2017-08-21 NOTE — ED Notes (Signed)
Patient agreeable to have swallow screen done prior to administration of Asprin. Swallow screen preformed by this RN, able to do a modified neuro exam.  Patient refused to have further exams done so unable to obtain full NIH score.  Van negative at this time.  Patient being discharged home after being seen by PA to attempt to convince patient to stay for admission.

## 2017-08-21 NOTE — ED Provider Notes (Signed)
[facility] EMERGENCY DEPARTMENT Provider Note   CSN: [id number] Arrival date & time: 08/21/17  1235     History   Chief Complaint Chief Complaint  Patient presents with  . Blurred Vision    HPI Diana Harris is a 52 y.o. female.  Patient with no significant past medical history of headaches or eye problems presents with complaint of 3 days of right temporal headache that is new.  She has had vision changes associated with this.  They can be in both eyes but are mainly in the left.  She describes colors.  Lots at times like "seeing stars".  At other times she will have central loss of vision and double vision.  Symptoms are transient and at time of exam she does not have any findings.  No treatments prior to arrival.  When these occur, she states that she just closes her eyes and hopes the vision problems go away.  No weakness, numbness, or tingling in her arms or legs.  No difficulty with speech or trouble walking.  She denies any fevers or neck pain.  No head injuries reported.  She has not had symptoms like this in the past.  She occasionally will wear reading glasses for near vision however denies any other corrective lenses.       History reviewed. No pertinent past medical history.  There are no active problems to display for this patient.   No past surgical history on file.  OB History    No data available       Home Medications    Prior to Admission medications   Medication Sig Start Date End Date Taking? Authorizing Provider  cephALEXin (KEFLEX) 500 MG capsule Take 2 capsules (1,000 mg total) by mouth 2 (two) times daily. Patient not taking: Reported on 08/21/2017 10/06/15   Fayrene Helper, PA-C  cyclobenzaprine (FLEXERIL) 10 MG tablet Take 1 tablet (10 mg total) by mouth 2 (two) times daily as needed for muscle spasms. Patient not taking: Reported on 08/21/2017 04/19/17   Pisciotta, Joni Reining, PA-C  HYDROcodone-acetaminophen (NORCO/VICODIN) 5-325 MG  tablet Take 1 tablet by mouth every 6 (six) hours as needed for moderate pain. Patient not taking: Reported on 08/21/2017 10/06/15   Fayrene Helper, PA-C    Family History No family history on file.  Social History Social History   Tobacco Use  . Smoking status: Current Every Day Smoker    Types: Cigarettes  Substance Use Topics  . Alcohol use: Yes    Comment: occ beer  . Drug use: No     Allergies   Patient has no known allergies.   Review of Systems Review of Systems  Constitutional: Negative for fever.  HENT: Negative for congestion, dental problem, rhinorrhea, sinus pressure and sore throat.   Eyes: Positive for visual disturbance. Negative for photophobia, discharge and redness.  Respiratory: Negative for cough and shortness of breath.   Cardiovascular: Negative for chest pain.  Gastrointestinal: Negative for abdominal pain, diarrhea, nausea and vomiting.  Genitourinary: Negative for dysuria.  Musculoskeletal: Negative for gait problem, myalgias, neck pain and neck stiffness.  Skin: Negative for rash.  Neurological: Positive for headaches. Negative for syncope, speech difficulty, weakness, light-headedness and numbness.  Psychiatric/Behavioral: Negative for confusion.     Physical Exam Updated Vital Signs BP (!) 146/83   Pulse 65   Temp 98 F (36.7 C) (Oral)   Resp 17   SpO2 98%   Physical Exam  Constitutional: She is oriented to person,  place, and time. She appears well-developed and well-nourished.  HENT:  Head: Normocephalic and atraumatic.  Right Ear: Tympanic membrane, external ear and ear canal normal.  Left Ear: Tympanic membrane, external ear and ear canal normal.  Nose: Nose normal.  Mouth/Throat: Uvula is midline, oropharynx is clear and moist and mucous membranes are normal.  Focal tenderness over the R temporal area. No bruit.   Eyes: Conjunctivae, EOM and lids are normal. Pupils are equal, round, and reactive to light. Right eye exhibits no  nystagmus. Left eye exhibits no nystagmus.  Neck: Normal range of motion. Neck supple.  Cardiovascular: Normal rate and regular rhythm.  No murmur heard. Pulmonary/Chest: Effort normal and breath sounds normal. No stridor. No respiratory distress. She has no wheezes.  Abdominal: Soft. There is no tenderness. There is no rebound and no guarding.  Musculoskeletal:       Cervical back: She exhibits normal range of motion, no tenderness and no bony tenderness.  Neurological: She is alert and oriented to person, place, and time. She has normal strength and normal reflexes. No cranial nerve deficit or sensory deficit. She displays a negative Romberg sign. Coordination and gait normal. GCS eye subscore is 4. GCS verbal subscore is 5. GCS motor subscore is 6.  Skin: Skin is warm and dry.  Psychiatric: She has a normal mood and affect.  Nursing note and vitals reviewed.    ED Treatments / Results  Labs (all labs ordered are listed, but only abnormal results are displayed) Labs Reviewed  CBC WITH DIFFERENTIAL/PLATELET - Abnormal; Notable for the following components:      Result Value   WBC 2.7 (*)    MCHC 36.1 (*)    Neutro Abs 1.0 (*)    All other components within normal limits  COMPREHENSIVE METABOLIC PANEL - Abnormal; Notable for the following components:   AST 78 (*)    ALT 102 (*)    All other components within normal limits  SEDIMENTATION RATE  C-REACTIVE PROTEIN  ETHANOL  PROTIME-INR  APTT  RAPID URINE DRUG SCREEN, HOSP PERFORMED  URINALYSIS, ROUTINE W REFLEX MICROSCOPIC  I-STAT TROPONIN, ED    Radiology Ct Head Wo Contrast  Result Date: 08/21/2017 CLINICAL DATA:  Acute headache. EXAM: CT HEAD WITHOUT CONTRAST TECHNIQUE: Contiguous axial images were obtained from the base of the skull through the vertex without intravenous contrast. COMPARISON:  None. FINDINGS: Brain: No evidence of acute infarction, hemorrhage, hydrocephalus, extra-axial collection or mass lesion/mass  effect. Vascular: No hyperdense vessel or unexpected calcification. Skull: Normal. Negative for fracture or focal lesion. Sinuses/Orbits: No acute finding. Other: None. IMPRESSION: Normal head CT. Electronically Signed   By: Lupita Raider, M.D.   On: 08/21/2017 14:09   Mr Angiogram Head Wo Contrast  Result Date: 08/21/2017 CLINICAL DATA:  52 y/o F; 3 days ago patient developed blurry vision with blue spots in both eyes and occasional double vision as well as headache. EXAM: MRI HEAD WITHOUT CONTRAST MRI ORBITS WITHOUT AND WITH CONTRAST MRA HEAD WITHOUT CONTRAST TECHNIQUE: Multiplanar, multiecho pulse sequences of the brain and surrounding structures were obtained without intravenous contrast. Multiplanar, multiecho pulse sequences of the orbits and surrounding structures were obtained including fat saturation techniques, before and after intravenous contrast administration. MRA of the head was acquired with noncontrast angiographic technique. CONTRAST:  10 cc MultiHance COMPARISON:  08/21/2016 CT head. FINDINGS: MRI HEAD FINDINGS Brain: Right occipital pole small region of reduced diffusion with increased T2 FLAIR involving cortex and subcortical white matter. No mass  effect or hemorrhage. No hydrocephalus, effacement of basilar cisterns, or extra-axial collection. No focal mass effect. Vascular: As below. Skull and upper cervical spine: Normal marrow signal. Other: None. MRI ORBITS FINDINGS Orbits: No traumatic or inflammatory finding. Globes, optic nerves, orbital fat, extraocular muscles, vascular structures, and lacrimal glands are normal. No abnormal enhancement. Visualized sinuses: Mild mucosal thickening of ethmoid air cells. Soft tissues: Negative. Limited intracranial: As above. MRA HEAD FINDINGS Internal carotid arteries:  Patent. Anterior cerebral arteries:  Patent. Middle cerebral arteries: Patent. Anterior communicating artery: Patent. Posterior communicating arteries: Not identified, likely  hypoplastic or absent. Posterior cerebral arteries: Patent left PCA. Two segments of high-grade stenosis in the P1/P2 (series 1106, image 17). Basilar artery:  Patent. Vertebral arteries:  Patent. No evidence of high-grade stenosis, large vessel occlusion, or aneurysm unless noted above. IMPRESSION: 1. Reduced diffusion of the posterior occipital lobe involving cortex and subcortical white matter and tandem segments of high-grade stenosis of right P1/P2. Findings suggest PRES/RCVS. Probable differential is acute/early subacute ischemic infarction due to atherosclerotic/thromboembolic disease. 2. No abnormal finding of the orbits. 3. Otherwise unremarkable MRI of the brain. These results were called by telephone at the time of interpretation on 08/21/2017 at 8:15 pm to Dr. Renne CriglerJOSHUA Saloma Cadena , who verbally acknowledged these results. Electronically Signed   By: Mitzi HansenLance  Furusawa-Stratton M.D.   On: 08/21/2017 20:20   Mr Brain Wo Contrast  Result Date: 08/21/2017 CLINICAL DATA:  52 y/o F; 3 days ago patient developed blurry vision with blue spots in both eyes and occasional double vision as well as headache. EXAM: MRI HEAD WITHOUT CONTRAST MRI ORBITS WITHOUT AND WITH CONTRAST MRA HEAD WITHOUT CONTRAST TECHNIQUE: Multiplanar, multiecho pulse sequences of the brain and surrounding structures were obtained without intravenous contrast. Multiplanar, multiecho pulse sequences of the orbits and surrounding structures were obtained including fat saturation techniques, before and after intravenous contrast administration. MRA of the head was acquired with noncontrast angiographic technique. CONTRAST:  10 cc MultiHance COMPARISON:  08/21/2016 CT head. FINDINGS: MRI HEAD FINDINGS Brain: Right occipital pole small region of reduced diffusion with increased T2 FLAIR involving cortex and subcortical white matter. No mass effect or hemorrhage. No hydrocephalus, effacement of basilar cisterns, or extra-axial collection. No focal mass  effect. Vascular: As below. Skull and upper cervical spine: Normal marrow signal. Other: None. MRI ORBITS FINDINGS Orbits: No traumatic or inflammatory finding. Globes, optic nerves, orbital fat, extraocular muscles, vascular structures, and lacrimal glands are normal. No abnormal enhancement. Visualized sinuses: Mild mucosal thickening of ethmoid air cells. Soft tissues: Negative. Limited intracranial: As above. MRA HEAD FINDINGS Internal carotid arteries:  Patent. Anterior cerebral arteries:  Patent. Middle cerebral arteries: Patent. Anterior communicating artery: Patent. Posterior communicating arteries: Not identified, likely hypoplastic or absent. Posterior cerebral arteries: Patent left PCA. Two segments of high-grade stenosis in the P1/P2 (series 1106, image 17). Basilar artery:  Patent. Vertebral arteries:  Patent. No evidence of high-grade stenosis, large vessel occlusion, or aneurysm unless noted above. IMPRESSION: 1. Reduced diffusion of the posterior occipital lobe involving cortex and subcortical white matter and tandem segments of high-grade stenosis of right P1/P2. Findings suggest PRES/RCVS. Probable differential is acute/early subacute ischemic infarction due to atherosclerotic/thromboembolic disease. 2. No abnormal finding of the orbits. 3. Otherwise unremarkable MRI of the brain. These results were called by telephone at the time of interpretation on 08/21/2017 at 8:15 pm to Dr. Renne CriglerJOSHUA Yousuf Ager , who verbally acknowledged these results. Electronically Signed   By: Mitzi HansenLance  Furusawa-Stratton M.D.   On: 08/21/2017  20:20   Mr [place] ZO Contrast  Result Date: 08/21/2017 CLINICAL DATA:  52 y/o F; 3 days ago patient developed blurry vision with blue spots in both eyes and occasional double vision as well as headache. EXAM: MRI HEAD WITHOUT CONTRAST MRI ORBITS WITHOUT AND WITH CONTRAST MRA HEAD WITHOUT CONTRAST TECHNIQUE: Multiplanar, multiecho pulse sequences of the brain and surrounding structures  were obtained without intravenous contrast. Multiplanar, multiecho pulse sequences of the orbits and surrounding structures were obtained including fat saturation techniques, before and after intravenous contrast administration. MRA of the head was acquired with noncontrast angiographic technique. CONTRAST:  10 cc MultiHance COMPARISON:  08/21/2016 CT head. FINDINGS: MRI HEAD FINDINGS Brain: Right occipital pole small region of reduced diffusion with increased T2 FLAIR involving cortex and subcortical white matter. No mass effect or hemorrhage. No hydrocephalus, effacement of basilar cisterns, or extra-axial collection. No focal mass effect. Vascular: As below. Skull and upper cervical spine: Normal marrow signal. Other: None. MRI ORBITS FINDINGS Orbits: No traumatic or inflammatory finding. Globes, optic nerves, orbital fat, extraocular muscles, vascular structures, and lacrimal glands are normal. No abnormal enhancement. Visualized sinuses: Mild mucosal thickening of ethmoid air cells. Soft tissues: Negative. Limited intracranial: As above. MRA HEAD FINDINGS Internal carotid arteries:  Patent. Anterior cerebral arteries:  Patent. Middle cerebral arteries: Patent. Anterior communicating artery: Patent. Posterior communicating arteries: Not identified, likely hypoplastic or absent. Posterior cerebral arteries: Patent left PCA. Two segments of high-grade stenosis in the P1/P2 (series 1106, image 17). Basilar artery:  Patent. Vertebral arteries:  Patent. No evidence of high-grade stenosis, large vessel occlusion, or aneurysm unless noted above. IMPRESSION: 1. Reduced diffusion of the posterior occipital lobe involving cortex and subcortical white matter and tandem segments of high-grade stenosis of right P1/P2. Findings suggest PRES/RCVS. Probable differential is acute/early subacute ischemic infarction due to atherosclerotic/thromboembolic disease. 2. No abnormal finding of the orbits. 3. Otherwise unremarkable MRI  of the brain. These results were called by telephone at the time of interpretation on 08/21/2017 at 8:15 pm to Dr. Renne Crigler , who verbally acknowledged these results. Electronically Signed   By: Mitzi Hansen M.D.   On: 08/21/2017 20:20    Procedures Procedures (including critical care time)  Medications Ordered in ED Medications  gadobenate dimeglumine (MULTIHANCE) injection 10 mL (not administered)  aspirin tablet 325 mg (325 mg Oral Given 08/21/17 2044)  sodium chloride 0.9 % bolus 500 mL (0 mLs Intravenous Stopped 08/21/17 1900)  metoCLOPramide (REGLAN) injection 10 mg (10 mg Intravenous Given 08/21/17 1729)  diphenhydrAMINE (BENADRYL) capsule 25 mg (25 mg Oral Given 08/21/17 1728)     Initial Impression / Assessment and Plan / ED Course  I have reviewed the triage vital signs and the nursing notes.  Pertinent labs & imaging results that were available during my care of the patient were reviewed by me and considered in my medical decision making (see chart for details).     4:19 PM Pt assigned but not yet in room.   Patient seen and examined. Work-up initiated. Medications ordered. Discussed with Dr. Jacqulyn Bath.   Vital signs reviewed and are as follows: BP (!) 146/83   Pulse 65   Temp 98 F (36.7 C) (Oral)   Resp 17   SpO2 98%   9:00 PM I spoke with the radiologist regarding this patient's MRI findings.  Findings most consistent with PRES versus infarct.   I was told that the patient wanted to leave by the nurse so I went and talked with  her about these findings.  She states that she cannot be admitted to the hospital tonight and states that she will plan on returning tomorrow morning.  I made it very clear that she has very concerning findings on her MRI and that she is at risk for worsening symptoms, possible additional strokes, seizures, permanent disability such as paralysis, or death.  She reports that she understands that if she leaves she may get much worse.   She is willing to take an aspirin prior to discharge.  She also states that if she gets worse tonight, she will return immediately to the emergency department.  I notified Dr. Jacqulyn Bath on patient's intentions to leave and he went and spoke with the patient as well.  As patient seems to have capacity to make medical decisions on her own behalf, she will be allowed to sign out AGAINST MEDICAL ADVICE.  I told the patient that if she changes her mind and does want to return later tonight or tomorrow, that she should absolutely return and we will admit her to the hospital.  We also discussed what further testing may occur if she is admitted.    Final Clinical Impressions(s) / ED Diagnoses   Final diagnoses:  Acute CVA (cerebrovascular accident) [facility])   Patient with visual changes likely due to acute CVA or possibly posterior reversible encephalopathy syndrome.  Patient does not want to stay in the emergency department tonight and is signing out AGAINST MEDICAL ADVICE as described above.  Patient may return at a later time for admission.  Neurology has not yet been consulted as patient was in a hurry to leave tonight.  If she returns within the next 24 hours, she will need neuro consult as well as inpatient admission.   ED Discharge Orders    None       Renne Crigler, Cordelia Poche 08/21/17 2105    Maia Plan, MD 08/21/17 2324

## 2017-08-21 NOTE — ED Notes (Signed)
Patient Alert and oriented X4. Stable and ambulatory. Patient verbalized understanding of the discharge instructions.  Patient belongings were taken by the patient.  

## 2017-08-21 NOTE — ED Notes (Signed)
Pt does not want to be to hooked up to the monitor, she wants to know how much longer she will be here and she wants her IV taken out. RN Arlys JohnBrian informed.

## 2017-08-21 NOTE — ED Provider Notes (Cosign Needed)
Patient placed in Quick Look pathway, seen and evaluated   Chief Complaint: HA and vision changes  HPI:   Pt reports 3 days of R temporal headache, and reports spots in her vision and blurred vision.   ROS: +vision changes, no sinus congestion, no CP/SOB  Physical Exam:   Gen: No distress  Neuro: Awake and Alert  Skin: Warm   Focused Exam: PERRL, EOMI, no nystagmus CN 2-12 grossly intact A&O x4 GCS 15 Sensation and strength intact Gait nonataxic including with tandem walking Coordination with finger-to-nose WNL Neg pronator drift    Initiation of care has begun. The patient has been counseled on the process, plan, and necessity for staying for the completion/evaluation, and the remainder of the medical screening examination    [address], [place], [place] 08/21/17 1325

## 2017-08-22 ENCOUNTER — Observation Stay (HOSPITAL_BASED_OUTPATIENT_CLINIC_OR_DEPARTMENT_OTHER): Payer: Self-pay

## 2017-08-22 ENCOUNTER — Observation Stay (HOSPITAL_COMMUNITY)
Admission: EM | Admit: 2017-08-22 | Discharge: 2017-08-23 | Disposition: A | Discharge status: Home / Self Care | Payer: Self-pay | Attending: Internal Medicine | Admitting: Internal Medicine

## 2017-08-22 ENCOUNTER — Encounter (HOSPITAL_COMMUNITY): Payer: Self-pay | Admitting: *Deleted

## 2017-08-22 ENCOUNTER — Other Ambulatory Visit: Payer: Self-pay

## 2017-08-22 DIAGNOSIS — H538 Other visual disturbances: Secondary | ICD-10-CM

## 2017-08-22 DIAGNOSIS — R519 Headache, unspecified: Secondary | ICD-10-CM

## 2017-08-22 DIAGNOSIS — F17210 Nicotine dependence, cigarettes, uncomplicated: Secondary | ICD-10-CM | POA: Insufficient documentation

## 2017-08-22 DIAGNOSIS — F1410 Cocaine abuse, uncomplicated: Secondary | ICD-10-CM

## 2017-08-22 DIAGNOSIS — I639 Cerebral infarction, unspecified: Principal | ICD-10-CM | POA: Diagnosis present

## 2017-08-22 DIAGNOSIS — F17200 Nicotine dependence, unspecified, uncomplicated: Secondary | ICD-10-CM | POA: Diagnosis present

## 2017-08-22 DIAGNOSIS — R51 Headache: Secondary | ICD-10-CM

## 2017-08-22 DIAGNOSIS — I63541 Cerebral infarction due to unspecified occlusion or stenosis of right cerebellar artery: Secondary | ICD-10-CM

## 2017-08-22 DIAGNOSIS — Z5321 Procedure and treatment not carried out due to patient leaving prior to being seen by health care provider: Secondary | ICD-10-CM

## 2017-08-22 DIAGNOSIS — I5030 Unspecified diastolic (congestive) heart failure: Secondary | ICD-10-CM

## 2017-08-22 HISTORY — DX: Tobacco use: Z72.0

## 2017-08-22 HISTORY — DX: Cerebral infarction, unspecified: I63.9

## 2017-08-22 LAB — COMPREHENSIVE METABOLIC PANEL
ALT: 79 U/L — AB (ref 14–54)
AST: 62 U/L — AB (ref 15–41)
Albumin: 3.1 g/dL — ABNORMAL LOW (ref 3.5–5.0)
Alkaline Phosphatase: 73 U/L (ref 38–126)
Anion gap: 8 (ref 5–15)
BILIRUBIN TOTAL: 0.8 mg/dL (ref 0.3–1.2)
BUN: 12 mg/dL (ref 6–20)
CO2: 21 mmol/L — ABNORMAL LOW (ref 22–32)
CREATININE: 0.77 mg/dL (ref 0.44–1.00)
Calcium: 9 mg/dL (ref 8.9–10.3)
Chloride: 109 mmol/L (ref 101–111)
GFR calc Af Amer: >60 mL/min (ref >60)
Glucose, Bld: 115 mg/dL — ABNORMAL HIGH (ref 65–99)
Potassium: 3.8 mmol/L (ref 3.5–5.1)
Sodium: 138 mmol/L (ref 135–145)
TOTAL PROTEIN: 5.7 g/dL — AB (ref 6.5–8.1)

## 2017-08-22 LAB — RAPID URINE DRUG SCREEN, HOSP PERFORMED
Amphetamines: NOT DETECTED
BARBITURATES: NOT DETECTED
Benzodiazepines: NOT DETECTED
Cocaine: POSITIVE — AB
Opiates: NOT DETECTED
Tetrahydrocannabinol: NOT DETECTED

## 2017-08-22 LAB — DIFFERENTIAL
BASOS ABS: 0 10*3/uL (ref 0.0–0.1)
Basophils Relative: 0 %
Eosinophils Absolute: 0 10*3/uL (ref 0.0–0.7)
Eosinophils Relative: 2 %
LYMPHS ABS: 1.1 10*3/uL (ref 0.7–4.0)
Lymphocytes Relative: 41 %
MONOS PCT: 12 %
Monocytes Absolute: 0.3 10*3/uL (ref 0.1–1.0)
NEUTROS ABS: 1.2 10*3/uL — AB (ref 1.7–7.7)
Neutrophils Relative %: 45 %

## 2017-08-22 LAB — URINALYSIS, ROUTINE W REFLEX MICROSCOPIC
BILIRUBIN URINE: NEGATIVE
Bacteria, UA: NONE SEEN
GLUCOSE, UA: NEGATIVE mg/dL
Hgb urine dipstick: NEGATIVE
KETONES UR: NEGATIVE mg/dL
Nitrite: NEGATIVE
PH: 5 (ref 5.0–8.0)
Protein, ur: NEGATIVE mg/dL
Specific Gravity, Urine: 1.018 (ref 1.005–1.030)

## 2017-08-22 LAB — CBC
HEMATOCRIT: 37.6 % (ref 36.0–46.0)
HEMOGLOBIN: 13.3 g/dL (ref 12.0–15.0)
MCH: 33.3 pg (ref 26.0–34.0)
MCHC: 35.4 g/dL (ref 30.0–36.0)
MCV: 94.2 fL (ref 78.0–100.0)
Platelets: 159 10*3/uL (ref 150–400)
RBC: 3.99 MIL/uL (ref 3.87–5.11)
RDW: 13.2 % (ref 11.5–15.5)
WBC: 2.7 10*3/uL — AB (ref 4.0–10.5)

## 2017-08-22 LAB — I-STAT TROPONIN, ED: TROPONIN I, POC: 0 ng/mL (ref 0.00–0.08)

## 2017-08-22 LAB — PROTIME-INR
INR: 0.94
Prothrombin Time: 12.4 seconds (ref 11.4–15.2)

## 2017-08-22 LAB — ETHANOL: Alcohol, Ethyl (B): <10 mg/dL (ref <10)

## 2017-08-22 LAB — PATHOLOGIST SMEAR REVIEW

## 2017-08-22 LAB — APTT: APTT: 31 s (ref 24–36)

## 2017-08-22 LAB — ECHOCARDIOGRAM COMPLETE

## 2017-08-22 MED ORDER — STROKE: EARLY STAGES OF RECOVERY BOOK
Freq: Once | Status: AC
  Administered 2017-08-22: 18:00:00
  Filled 2017-08-22: qty 1

## 2017-08-22 MED ORDER — ATORVASTATIN CALCIUM 80 MG PO TABS
80.0000 mg | ORAL_TABLET | Freq: Every day | ORAL | Status: DC
  Administered 2017-08-22: 80 mg via ORAL
  Filled 2017-08-22 (×2): qty 1

## 2017-08-22 MED ORDER — ACETAMINOPHEN 325 MG PO TABS: 650.0000 mg | ORAL_TABLET | ORAL | Status: DC | PRN

## 2017-08-22 MED ORDER — SENNOSIDES-DOCUSATE SODIUM 8.6-50 MG PO TABS: 1.0000 | ORAL_TABLET | Freq: Every evening | ORAL | Status: DC | PRN

## 2017-08-22 MED ORDER — ACETAMINOPHEN 650 MG RE SUPP: 650.0000 mg | RECTAL | Status: DC | PRN

## 2017-08-22 MED ORDER — ACETAMINOPHEN 160 MG/5ML PO SOLN: 650.0000 mg | ORAL | Status: DC | PRN

## 2017-08-22 MED ORDER — ENOXAPARIN SODIUM 40 MG/0.4ML ~~LOC~~ SOLN
40.0000 mg | SUBCUTANEOUS | Status: DC
  Administered 2017-08-22 – 2017-08-23 (×2): 40 mg via SUBCUTANEOUS
  Filled 2017-08-22 (×2): qty 0.4

## 2017-08-22 MED ORDER — ASPIRIN 325 MG PO TABS
325.0000 mg | ORAL_TABLET | Freq: Every day | ORAL | Status: DC
  Administered 2017-08-22 – 2017-08-23 (×2): 325 mg via ORAL
  Filled 2017-08-22 (×2): qty 1

## 2017-08-22 NOTE — ED Notes (Signed)
Back from echo- 3W made aware to transport pt

## 2017-08-22 NOTE — Progress Notes (Signed)
  Echocardiogram 2D Echocardiogram has been performed.  Diana Harris, Diana Harris 08/22/2017, 4:39 PM

## 2017-08-22 NOTE — H&P (Signed)
Date: 08/22/2017               Patient Name:  Diana Harris MRN: [id number]  DOB: [birth year] Age / Sex: 52 y.o., female   PCP: Patient, No Pcp Per         Medical Service: Internal Medicine Teaching Service         Attending Physician: Dr. Doneen Poisson, MD    First Contact: Dr. Alinda Money Pager: [phone]  Second Contact: Dr. Nelson Chimes Pager: [phone]       After Hours (After 5p/  First Contact Pager: [phone]  weekends / holidays): Second Contact Pager: [phone]   Chief Complaint: Stroke  History of Present Illness: Niamh Rada is a 52 year old woman with a history of tobacco use who presented to the ED yesterday and again today for headache and visual blurring and floaters for 4 days. During evaluation yesterday she underwent MRI/MRA that demonstrated a right posterior occipital acute or subacute stroke. During her ED stay her headache improved partially and she left after feeling she couldn't wait any longer but returned to the ED today with persistent vision changes and due to medical advice to undergo workup. There are no other areas of change in sensation or strength. Besides these complaints she has been feeling in usual health lately. She denies any history of headaches in the past. She has never suffered vision problems before either. She is not aware of any prior strokes or any family history of stroke. She does not follow up with doctors regularly and takes no medications.  Meds:  Current Meds  Medication Sig  . ibuprofen (ADVIL,MOTRIN) 200 MG tablet Take 400 mg by mouth every 6 (six) hours as needed for headache or moderate pain.     Allergies: Allergies as of 08/22/2017  . (No Known Allergies)   Past Medical History:  Diagnosis Date  . Tobacco abuse     Family History:  Family History  Problem Relation Age of Onset  . Hypertension Mother   . Hypertension Father   . Lung cancer Sister   . Prostate cancer Brother      Social History:  Current every day  smoker estimates 1/2 ppd x 52yrs or more Occasional alcohol use Denies other illicit substance use  Review of Systems: Review of Systems  Constitutional: Negative for chills and fever.  HENT: Negative for hearing loss.   Eyes: Positive for blurred vision. Negative for double vision, photophobia, pain and discharge.  Respiratory: Negative for cough.   Cardiovascular: Negative for chest pain.  Gastrointestinal: Negative for diarrhea.  Genitourinary: Negative for dysuria.  Musculoskeletal: Negative for falls.  Skin: Negative for rash.  Neurological: Positive for headaches. Negative for sensory change and speech change.  Psychiatric/Behavioral: Negative for substance abuse.    Physical Exam: Blood pressure 130/87, pulse (!) 52, temperature 99.1 F (37.3 C), temperature source Oral, resp. rate 16, SpO2 99 %. GENERAL- alert, co-operative, NAD HEENT- Moist oral mucosa, no cervical lymphadenopathy CARDIAC- RRR, no murmurs, rubs or gallops. RESP- CTAB, no wheezes or crackles. ABDOMEN- Soft, nontender, no guarding or rebound BACK- Normal curvature, no paraspinal tenderness, no restricted neck ROM NEURO- CN: Vision seems partially deficient in the left inferior visual field unable to count fingers but perceived gross movement, eye movements intact and PERRLA symmetrically, right lower facial resting flattening but symmetric smile, remainder of exam normal Strength is 5/5 throughout extremities, DTRs 2+ bilaterally EXTREMITIES- pulse 2+, symmetric, no pedal edema. SKIN- Warm, dry, No rash  or lesion. PSYCH- Normal mood and affect, appropriate thought content and speech.  EKG: Pending  Assessment & Plan by Problem: Right posterior occipital ischemic stroke: I agree with neurology assessment that MRI findings most likely  represent stroke given the clinical picture and proximal ipsilateral vessel stenosis. Symptoms seem somewhat inconsistent with different examinations reports on visual field  testing, questionable right facial changes that would not match this lesion. We will admit for stroke workup. It is unclear if she does have a history of hypertension by chart review, her known risk factor is ongoing smoking. -TTE -Lipid profile -Hgb A1c -Cardiac monitoring -SLP,OT,PT eval -ASA 325mg  daily -Atorvastatin 80mg   Tobacco use She would certainly benefit from cessation for risk factor modification. Maybe a good time for her to be contemplative especially in outpatient follow up.  Diet: Regular VTE ppx: Enoxaparin FULL CODE  Dispo: Admit patient to Observation with expected length of stay less than 2 midnights.  Signed: Fuller Planhristopher W Savanna Dooley, MD PGY-III Internal Medicine Resident Pager# [phone] 08/22/2017, 11:00 AM

## 2017-08-22 NOTE — ED Notes (Signed)
Neuro at bedside.

## 2017-08-22 NOTE — Progress Notes (Signed)
Pt admitted to the unit from ED; pt A&O x4; pt oriented to the unit and room; fall/safety precaution and prevention education completed and pt voices understanding. Telemetry applied and verified with CCMD; NT called to second verify. VSS; pt skin intact with no pressure ulcers or opened wounds noted. Pt in bed with call light within reach and bed alarm on. Will closely monitor pt. P. Amo Daylin Gruszka. RN

## 2017-08-22 NOTE — ED Provider Notes (Signed)
[facility]   CSN: [id number] Arrival date & time: 08/22/17  0740     History   Chief Complaint Chief Complaint  Patient presents with  . Follow-up  . Headache    HPI Diana Harris is a 52 y.o. female.  HPI   52 year old female with no significant medical history who had presented yesterday with concern for headache and visual changes, and had an MRI which was concerning for occipital CVA versus PRES and left the emergency department AGAINST MEDICAL ADVICE, returns today desiring admission.  Patient reports she has had 4 days of headaches and visual changes.  Reports that the visual changes have mostly been in her left eye, describes colored floaters that are intermittent.  She reports she is not having any at this moment.  Reports she has had headaches, and yesterday her headache was up to a 20 out of 10, but now it is down to a 6 out of 10.  Denies any numbness, weakness, facial droop, difficulty walking or talking.  Reports that she was tired of being in the emergency department yesterday so left, but this morning when she woke up with continued headache she decided to return to the emergency department.  Past Medical History:  Diagnosis Date  . Tobacco abuse     Patient Active Problem List   Diagnosis Date Noted  . Ischemic stroke (HCC) 08/22/2017  . Tobacco use disorder 08/22/2017    Past Surgical History:  Procedure Laterality Date  . CESAREAN SECTION      OB History    No data available       Home Medications    Prior to Admission medications   Medication Sig Start Date End Date Taking? Authorizing Provider  ibuprofen (ADVIL,MOTRIN) 200 MG tablet Take 400 mg by mouth every 6 (six) hours as needed for headache or moderate pain.   Yes [provider]    Family History Family History  Problem Relation Age of Onset  . Hypertension Mother   . Hypertension Father   . Lung cancer Sister   .  Prostate cancer Brother     Social History Social History   Tobacco Use  . Smoking status: Current Every Day Smoker    Packs/day: 0.50    Years: 20.00    Pack years: 10.00    Types: Cigarettes  . Smokeless tobacco: Never Used  . Tobacco comment: 20 or more years  Substance Use Topics  . Alcohol use: Yes    Comment: occ beer  . Drug use: No     Allergies   Patient has no known allergies.   Review of Systems Review of Systems  Constitutional: Negative for fever.  HENT: Negative for sore throat.   Eyes: Positive for visual disturbance.  Respiratory: Negative for cough and shortness of breath.   Cardiovascular: Negative for chest pain.  Gastrointestinal: Negative for abdominal pain, nausea and vomiting.  Genitourinary: Negative for difficulty urinating.  Musculoskeletal: Negative for back pain and neck pain.  Skin: Negative for rash.  Neurological: Positive for headaches. Negative for syncope, facial asymmetry, speech difficulty, weakness and numbness.     Physical Exam Updated Vital Signs BP (!) 148/82   Pulse (!) 51   Temp 99.1 F (37.3 C) (Oral)   Resp 17   SpO2 100%   Physical Exam  Constitutional: She is oriented to person, place, and time. She appears well-developed and well-nourished. No distress.  HENT:  Head: Normocephalic and atraumatic.  Eyes: Conjunctivae and EOM are normal.  Neck: Normal range of motion.  Cardiovascular: Normal rate, regular rhythm, normal heart sounds and intact distal pulses. Exam reveals no gallop and no friction rub.  No murmur heard. Pulmonary/Chest: Effort normal and breath sounds normal. No respiratory distress. She has no wheezes. She has no rales.  Abdominal: Soft. She exhibits no distension. There is no tenderness. There is no guarding.  Musculoskeletal: She exhibits no edema or tenderness.  Neurological: She is alert and oriented to person, place, and time. She has normal strength. No cranial nerve deficit or sensory  deficit. Coordination normal. GCS eye subscore is 4. GCS verbal subscore is 5. GCS motor subscore is 6.  Skin: Skin is warm and dry. No rash noted. She is not diaphoretic. No erythema.  Nursing note and vitals reviewed.    ED Treatments / Results  Labs (all labs ordered are listed, but only abnormal results are displayed) Labs Reviewed  CBC - Abnormal; Notable for the following components:      Result Value   WBC 2.7 (*)    All other components within normal limits  DIFFERENTIAL - Abnormal; Notable for the following components:   Neutro Abs 1.2 (*)    All other components within normal limits  COMPREHENSIVE METABOLIC PANEL - Abnormal; Notable for the following components:   CO2 21 (*)    Glucose, Bld 115 (*)    Total Protein 5.7 (*)    Albumin 3.1 (*)    AST 62 (*)    ALT 79 (*)    All other components within normal limits  RAPID URINE DRUG SCREEN, HOSP PERFORMED - Abnormal; Notable for the following components:   Cocaine POSITIVE (*)    All other components within normal limits  URINALYSIS, ROUTINE W REFLEX MICROSCOPIC - Abnormal; Notable for the following components:   Leukocytes, UA TRACE (*)    Squamous Epithelial / LPF 0-5 (*)    All other components within normal limits  ETHANOL  PROTIME-INR  APTT  HIV ANTIBODY (ROUTINE TESTING)  HEMOGLOBIN A1C  LIPID PANEL  I-STAT TROPONIN, ED    EKG  EKG Interpretation None       Radiology Ct Head Wo Contrast  Result Date: 08/21/2017 CLINICAL DATA:  Acute headache. EXAM: CT HEAD WITHOUT CONTRAST TECHNIQUE: Contiguous axial images were obtained from the base of the skull through the vertex without intravenous contrast. COMPARISON:  None. FINDINGS: Brain: No evidence of acute infarction, hemorrhage, hydrocephalus, extra-axial collection or mass lesion/mass effect. Vascular: No hyperdense vessel or unexpected calcification. Skull: Normal. Negative for fracture or focal lesion. Sinuses/Orbits: No acute finding. Other: None.  IMPRESSION: Normal head CT. Electronically Signed   By: Lupita Raider, M.D.   On: 08/21/2017 14:09   Mr Angiogram Head Wo Contrast  Result Date: 08/21/2017 CLINICAL DATA:  52 y/o F; 3 days ago patient developed blurry vision with blue spots in both eyes and occasional double vision as well as headache. EXAM: MRI HEAD WITHOUT CONTRAST MRI ORBITS WITHOUT AND WITH CONTRAST MRA HEAD WITHOUT CONTRAST TECHNIQUE: Multiplanar, multiecho pulse sequences of the brain and surrounding structures were obtained without intravenous contrast. Multiplanar, multiecho pulse sequences of the orbits and surrounding structures were obtained including fat saturation techniques, before and after intravenous contrast administration. MRA of the head was acquired with noncontrast angiographic technique. CONTRAST:  10 cc MultiHance COMPARISON:  08/21/2016 CT head. FINDINGS: MRI HEAD FINDINGS Brain: Right occipital pole small region of reduced diffusion with increased T2 FLAIR involving cortex  and subcortical white matter. No mass effect or hemorrhage. No hydrocephalus, effacement of basilar cisterns, or extra-axial collection. No focal mass effect. Vascular: As below. Skull and upper cervical spine: Normal marrow signal. Other: None. MRI ORBITS FINDINGS Orbits: No traumatic or inflammatory finding. Globes, optic nerves, orbital fat, extraocular muscles, vascular structures, and lacrimal glands are normal. No abnormal enhancement. Visualized sinuses: Mild mucosal thickening of ethmoid air cells. Soft tissues: Negative. Limited intracranial: As above. MRA HEAD FINDINGS Internal carotid arteries:  Patent. Anterior cerebral arteries:  Patent. Middle cerebral arteries: Patent. Anterior communicating artery: Patent. Posterior communicating arteries: Not identified, likely hypoplastic or absent. Posterior cerebral arteries: Patent left PCA. Two segments of high-grade stenosis in the P1/P2 (series 1106, image 17). Basilar artery:  Patent.  Vertebral arteries:  Patent. No evidence of high-grade stenosis, large vessel occlusion, or aneurysm unless noted above. IMPRESSION: 1. Reduced diffusion of the posterior occipital lobe involving cortex and subcortical white matter and tandem segments of high-grade stenosis of right P1/P2. Findings suggest PRES/RCVS. Probable differential is acute/early subacute ischemic infarction due to atherosclerotic/thromboembolic disease. 2. No abnormal finding of the orbits. 3. Otherwise unremarkable MRI of the brain. These results were called by telephone at the time of interpretation on 08/21/2017 at 8:15 pm to Dr. Renne Crigler , who verbally acknowledged these results. Electronically Signed   By: Mitzi Hansen M.D.   On: 08/21/2017 20:20   Mr Brain Wo Contrast  Result Date: 08/21/2017 CLINICAL DATA:  52 y/o F; 3 days ago patient developed blurry vision with blue spots in both eyes and occasional double vision as well as headache. EXAM: MRI HEAD WITHOUT CONTRAST MRI ORBITS WITHOUT AND WITH CONTRAST MRA HEAD WITHOUT CONTRAST TECHNIQUE: Multiplanar, multiecho pulse sequences of the brain and surrounding structures were obtained without intravenous contrast. Multiplanar, multiecho pulse sequences of the orbits and surrounding structures were obtained including fat saturation techniques, before and after intravenous contrast administration. MRA of the head was acquired with noncontrast angiographic technique. CONTRAST:  10 cc MultiHance COMPARISON:  08/21/2016 CT head. FINDINGS: MRI HEAD FINDINGS Brain: Right occipital pole small region of reduced diffusion with increased T2 FLAIR involving cortex and subcortical white matter. No mass effect or hemorrhage. No hydrocephalus, effacement of basilar cisterns, or extra-axial collection. No focal mass effect. Vascular: As below. Skull and upper cervical spine: Normal marrow signal. Other: None. MRI ORBITS FINDINGS Orbits: No traumatic or inflammatory finding. Globes,  optic nerves, orbital fat, extraocular muscles, vascular structures, and lacrimal glands are normal. No abnormal enhancement. Visualized sinuses: Mild mucosal thickening of ethmoid air cells. Soft tissues: Negative. Limited intracranial: As above. MRA HEAD FINDINGS Internal carotid arteries:  Patent. Anterior cerebral arteries:  Patent. Middle cerebral arteries: Patent. Anterior communicating artery: Patent. Posterior communicating arteries: Not identified, likely hypoplastic or absent. Posterior cerebral arteries: Patent left PCA. Two segments of high-grade stenosis in the P1/P2 (series 1106, image 17). Basilar artery:  Patent. Vertebral arteries:  Patent. No evidence of high-grade stenosis, large vessel occlusion, or aneurysm unless noted above. IMPRESSION: 1. Reduced diffusion of the posterior occipital lobe involving cortex and subcortical white matter and tandem segments of high-grade stenosis of right P1/P2. Findings suggest PRES/RCVS. Probable differential is acute/early subacute ischemic infarction due to atherosclerotic/thromboembolic disease. 2. No abnormal finding of the orbits. 3. Otherwise unremarkable MRI of the brain. These results were called by telephone at the time of interpretation on 08/21/2017 at 8:15 pm to Dr. Renne Crigler , who verbally acknowledged these results. Electronically Signed   By: Micah Noel  [place] M.D.   On: 08/21/2017 20:20   Mr [place] ZO Contrast  Result Date: 08/21/2017 CLINICAL DATA:  52 y/o F; 3 days ago patient developed blurry vision with blue spots in both eyes and occasional double vision as well as headache. EXAM: MRI HEAD WITHOUT CONTRAST MRI ORBITS WITHOUT AND WITH CONTRAST MRA HEAD WITHOUT CONTRAST TECHNIQUE: Multiplanar, multiecho pulse sequences of the brain and surrounding structures were obtained without intravenous contrast. Multiplanar, multiecho pulse sequences of the orbits and surrounding structures were obtained including fat saturation  techniques, before and after intravenous contrast administration. MRA of the head was acquired with noncontrast angiographic technique. CONTRAST:  10 cc MultiHance COMPARISON:  08/21/2016 CT head. FINDINGS: MRI HEAD FINDINGS Brain: Right occipital pole small region of reduced diffusion with increased T2 FLAIR involving cortex and subcortical white matter. No mass effect or hemorrhage. No hydrocephalus, effacement of basilar cisterns, or extra-axial collection. No focal mass effect. Vascular: As below. Skull and upper cervical spine: Normal marrow signal. Other: None. MRI ORBITS FINDINGS Orbits: No traumatic or inflammatory finding. Globes, optic nerves, orbital fat, extraocular muscles, vascular structures, and lacrimal glands are normal. No abnormal enhancement. Visualized sinuses: Mild mucosal thickening of ethmoid air cells. Soft tissues: Negative. Limited intracranial: As above. MRA HEAD FINDINGS Internal carotid arteries:  Patent. Anterior cerebral arteries:  Patent. Middle cerebral arteries: Patent. Anterior communicating artery: Patent. Posterior communicating arteries: Not identified, likely hypoplastic or absent. Posterior cerebral arteries: Patent left PCA. Two segments of high-grade stenosis in the P1/P2 (series 1106, image 17). Basilar artery:  Patent. Vertebral arteries:  Patent. No evidence of high-grade stenosis, large vessel occlusion, or aneurysm unless noted above. IMPRESSION: 1. Reduced diffusion of the posterior occipital lobe involving cortex and subcortical white matter and tandem segments of high-grade stenosis of right P1/P2. Findings suggest PRES/RCVS. Probable differential is acute/early subacute ischemic infarction due to atherosclerotic/thromboembolic disease. 2. No abnormal finding of the orbits. 3. Otherwise unremarkable MRI of the brain. These results were called by telephone at the time of interpretation on 08/21/2017 at 8:15 pm to Dr. Renne Crigler , who verbally acknowledged these  results. Electronically Signed   By: Mitzi Hansen M.D.   On: 08/21/2017 20:20    Procedures Procedures (including critical care time)  Medications Ordered in ED Medications   stroke: mapping our early stages of recovery book (not administered)  acetaminophen (TYLENOL) tablet 650 mg (not administered)    Or  acetaminophen (TYLENOL) solution 650 mg (not administered)    Or  acetaminophen (TYLENOL) suppository 650 mg (not administered)  senna-docusate (Senokot-S) tablet 1 tablet (not administered)  enoxaparin (LOVENOX) injection 40 mg (40 mg Subcutaneous Given 08/22/17 1438)  aspirin tablet 325 mg (325 mg Oral Given 08/22/17 1231)  atorvastatin (LIPITOR) tablet 80 mg (not administered)     Initial Impression / Assessment and Plan / ED Course  I have reviewed the triage vital signs and the nursing notes.  Pertinent labs & imaging results that were available during my care of the patient were reviewed by me and considered in my medical decision making (see chart for details).     52 year old female with no significant medical history who had presented yesterday with concern for headache and visual changes, and had an MRI which was concerning for occipital CVA versus PRES and left the emergency department AGAINST MEDICAL ADVICE, returns today desiring admission.  MR reviewed from yesterday shows concern for CVA vs PRES. Pt without significant metabolic or blood pressure derangements to suggest PRES.  Continuing  headache and intermittent visual changes. Discussed with Dr. Wilford Corner of Neurology who will evaluate the patient. Will admit for stroke work up.   Final Clinical Impressions(s) / ED Diagnoses   Final diagnoses:  Acute nonintractable headache, unspecified headache type  Cerebrovascular accident (CVA), unspecified mechanism [facility])    ED Discharge Orders    None       Alvira Monday, MD 08/22/17 1710

## 2017-08-22 NOTE — Evaluation (Signed)
Physical Therapy Evaluation & Discharge  Patient Details Name: Diana Harris MRN: [id number] DOB: 01/03/1966 Today's Date: 08/22/2017   History of Present Illness  Pt is a 52 y/o female admitted secondary to blurred/double vision. MRI revealed R posterior occipital infarct. PMH including but not limited to tobacco abuse.  Clinical Impression  Pt presented supine in bed with HOB elevated, awake and willing to participate in therapy session. Prior to admission, pt reported that she was independent with all functional mobility and ADLs. Pt lives alone but has family available to assist as needed. Pt currently performing bed mobility at modified independence level, transfers with supervision and ambulated in hallway with supervision without use of an AD. Pt also participated in a higher level balance assessment and scored a 22/24 on the DGI, indicating that she is a safe community ambulator. No further acute PT needs identified at this time. PT signing off.     Follow Up Recommendations No PT follow up    Equipment Recommendations  None recommended by PT    Recommendations for Other Services       Precautions / Restrictions Restrictions Weight Bearing Restrictions: No      Mobility  Bed Mobility Overal bed mobility: Modified Independent                Transfers Overall transfer level: Needs assistance Equipment used: None Transfers: Sit to/from Stand Sit to Stand: Supervision         General transfer comment: supervision for safety  Ambulation/Gait Ambulation/Gait assistance: Supervision Ambulation Distance (Feet): 500 Feet Assistive device: None Gait Pattern/deviations: Step-through pattern Gait velocity: able to vary Gait velocity interpretation: at or above normal speed for age/gender General Gait Details: no instability or LOB, supervision for safety, no use of an AD  Stairs            Wheelchair Mobility    Modified Rankin (Stroke Patients  Only) Modified Rankin (Stroke Patients Only) Pre-Morbid Rankin Score: No symptoms Modified Rankin: No significant disability     Balance Overall balance assessment: Needs assistance Sitting-balance support: Feet supported Sitting balance-Leahy Scale: Good     Standing balance support: During functional activity;No upper extremity supported Standing balance-Leahy Scale: Good                   Standardized Balance Assessment Standardized Balance Assessment : Dynamic Gait Index   Dynamic Gait Index Level Surface: Normal Change in Gait Speed: Normal Gait with Horizontal Head Turns: Normal Gait with Vertical Head Turns: Normal Gait and Pivot Turn: Mild Impairment Step Over Obstacle: Normal Step Around Obstacles: Normal Steps: Mild Impairment Total Score: 22       Pertinent Vitals/Pain Pain Assessment: No/denies pain    Home Living Family/patient expects to be discharged to:: Private residence Living Arrangements: Alone Available Help at Discharge: Family;Available PRN/intermittently Type of Home: House Home Access: Level entry     Home Layout: One level Home Equipment: None      Prior Function Level of Independence: Independent               Hand Dominance        Extremity/Trunk Assessment   Upper Extremity Assessment Upper Extremity Assessment: Overall WFL for tasks assessed    Lower Extremity Assessment Lower Extremity Assessment: Overall WFL for tasks assessed       Communication   Communication: No difficulties  Cognition Arousal/Alertness: Awake/alert Behavior During Therapy: WFL for tasks assessed/performed Overall Cognitive Status: Within Functional Limits for tasks assessed  General Comments      Exercises     Assessment/Plan    PT Assessment Patent does not need any further PT services  PT Problem List         PT Treatment Interventions      PT Goals (Current  goals can be found in the Care Plan section)  Acute Rehab PT Goals Patient Stated Goal: return home    Frequency     Barriers to discharge        Co-evaluation               AM-PAC PT "6 Clicks" Daily Activity  Outcome Measure Difficulty turning over in bed (including adjusting bedclothes, sheets and blankets)?: None Difficulty moving from lying on back to sitting on the side of the bed? : None Difficulty sitting down on and standing up from a chair with arms (e.g., wheelchair, bedside commode, etc,.)?: None Help needed moving to and from a bed to chair (including a wheelchair)?: None Help needed walking in hospital room?: None Help needed climbing 3-5 steps with a railing? : None 6 Click Score: 24    End of Session Equipment Utilized During Treatment: Gait belt Activity Tolerance: Patient tolerated treatment well Patient left: in bed;with call bell/phone within reach Nurse Communication: Mobility status PT Visit Diagnosis: Other symptoms and signs involving the nervous system (R29.898)    Time: 4098-1191 PT Time Calculation (min) (ACUTE ONLY): 17 min   Charges:   PT Evaluation $PT Eval Moderate Complexity: 1 Mod     PT G Codes:        [place], PT, DPT [phone]   Diana Harris Diana Harris 08/22/2017, 4:43 PM

## 2017-08-22 NOTE — ED Notes (Signed)
This nurse enters room to administer aspirin and PT is eating [employer]. PT had been told twice that she is NPO at this time. PT having no difficulty eating or swallowing. PT otherwise compliant with treatment

## 2017-08-22 NOTE — Consult Note (Signed)
Requesting Physician: Dr. Dalene SeltzerSCHLOSSMAN, E    Chief Complaint: Stroke  History obtained from:  Patient     HPI:                                                                                                                                         Diana Harris is an 52 y.o. female who has a significant history for tobacco abuse, drinking alcohol who arrived at the hospital yesterday for evaluation of headache and spots in her vision.  Patient did have an MRI which consisted of brain, orbits and MRA of head.  The MRI revealed a right posterior occipital infarct.  Unfortunately due to waiting a significant amount of time patient left the emergency room.  Patient returns today to have further evaluation as she is nervous about her symptoms.  Patient still complains about mild headache and spots in her vision.  Date last known well: Date: 08/18/2017 Time last known well: Unable to determine tPA Given: No: Out of the window NIH stroke scale of 1 Modified Rankin: Rankin Score=0   Past Medical History:  Diagnosis Date  . Tobacco abuse     History reviewed. No pertinent surgical history.  Family History  Problem Relation Age of Onset  . Hypertension Mother   . Hypertension Father    Social History:  reports that she has been smoking cigarettes.  She does not have any smokeless tobacco history on file. She reports that she drinks alcohol. She reports that she does not use drugs.  Allergies: No Known Allergies  Medications:                                                                                                                           No current facility-administered medications for this encounter.    Current Outpatient Medications  Medication Sig Dispense Refill  . ibuprofen (ADVIL,MOTRIN) 200 MG tablet Take 400 mg by mouth every 6 (six) hours as needed for headache or moderate pain.       ROS:  History obtained from the patient  General ROS: negative for - chills, fatigue, fever, night sweats, weight gain or weight loss Psychological ROS: negative for - , hallucinations, memory difficulties, mood swings or  Ophthalmic ROS: Positive for - blurry vision, ENT ROS: negative for - epistaxis, nasal discharge, oral lesions, sore throat, tinnitus or vertigo Respiratory ROS: negative for - cough,  shortness of breath or wheezing Cardiovascular ROS: negative for - chest pain, dyspnea on exertion,  Gastrointestinal ROS: negative for - abdominal pain, diarrhea,  nausea/vomiting or stool incontinence Genito-Urinary ROS: negative for - dysuria, hematuria, incontinence or urinary frequency/urgency Musculoskeletal ROS: negative for - joint swelling or muscular weakness Neurological ROS: as noted in HPI   General Examination:                                                                                                      Blood pressure 135/86, pulse 70, temperature 99.1 F (37.3 C), temperature source Oral, resp. rate 18, SpO2 99 %.  HEENT-  Normocephalic, no lesions, without obvious abnormality.  Normal external eye and conjunctiva.   Cardiovascular- S1-S2 audible, pulses palpable throughout   Lungs-no rhonchi or wheezing noted, no excessive working breathing.  Saturations within normal limits Abdomen- All 4 quadrants palpated and nontender Extremities- Warm, dry and intact Musculoskeletal-no joint tenderness, deformity or swelling Skin-warm and dry, no hyperpigmentation, vitiligo, or suspicious lesions  Neurological Examination Mental Status: Alert, oriented, thought content appropriate.  Speech fluent without evidence of aphasia.  Able to follow 3 step commands without difficulty. Cranial Nerves: II: Appears to have decreased vision in the right inferior lateral quadrant III,IV, VI: ptosis not present, extra-ocular motions intact  bilaterally, pupils equal, round, reactive to light and accommodation V,VII: smile symmetric, right nasolabial fold decrease,  facial light touch sensation normal bilaterally VIII: hearing normal bilaterally IX,X: uvula rises symmetrically XI: bilateral shoulder shrug XII: midline tongue extension Motor: Right : Upper extremity   5/5    Left:     Upper extremity   5/5  Lower extremity   5/5     Lower extremity   5/5 Tone and bulk:normal tone throughout; no atrophy noted Sensory: Pinprick and light touch intact throughout, bilaterally Deep Tendon Reflexes: 2+ and symmetric throughout Plantars: Right: downgoing   Left: downgoing Cerebellar: normal finger-to-nose,  and normal heel-to-shin test Gait: Not tested   Lab Results: Basic Metabolic Panel: Recent Labs  Lab 08/21/17 1706  NA 140  K 4.1  CL 106  CO2 25  GLUCOSE 90  BUN 12  CREATININE 0.72  CALCIUM 9.4    CBC: Recent Labs  Lab 08/21/17 1706  WBC 2.7*  NEUTROABS 1.0*  HGB 14.9  HCT 41.3  MCV 93.7  PLT 177    Imaging: Ct Head Wo Contrast  Result Date: 08/21/2017 CLINICAL DATA:  Acute headache. EXAM: CT HEAD WITHOUT CONTRAST TECHNIQUE: Contiguous axial images were obtained from the base of the skull through the vertex without intravenous contrast. COMPARISON:  None. FINDINGS: Brain: No evidence of acute infarction, hemorrhage, hydrocephalus, extra-axial collection or mass lesion/mass effect. Vascular: No hyperdense  vessel or unexpected calcification. Skull: Normal. Negative for fracture or focal lesion. Sinuses/Orbits: No acute finding. Other: None. IMPRESSION: Normal head CT. Electronically Signed   By: Lupita Raider, M.D.   On: 08/21/2017 14:09   Mr Angiogram Head Wo Contrast  . IMPRESSION: 1. Reduced diffusion of the posterior occipital lobe involving cortex and subcortical white matter and tandem segments of high-grade stenosis of right P1/P2. Findings suggest PRES/RCVS. Probable differential is acute/early  subacute ischemic infarction due to atherosclerotic/thromboembolic disease. 2. No abnormal finding of the orbits. 3. Otherwise unremarkable MRI of the brain. These results were called by telephone at the time of interpretation on 08/21/2017 at 8:15 pm to Dr. Renne Crigler , who verbally acknowledged these results. Electronically Signed   By: Mitzi Hansen M.D.   On: 08/21/2017 20:20   Mr Brain Wo Contrast  . IMPRESSION: 1. Reduced diffusion of the posterior occipital lobe involving cortex and subcortical white matter and tandem segments of high-grade stenosis of right P1/P2. Findings suggest PRES/RCVS. Probable differential is acute/early subacute ischemic infarction due to atherosclerotic/thromboembolic disease. 2. No abnormal finding of the orbits. 3. Otherwise unremarkable MRI of the brain. These results were called by telephone at the time of interpretation on 08/21/2017 at 8:15 pm to Dr. Renne Crigler , who verbally acknowledged these results. Electronically Signed   By: Mitzi Hansen M.D.   On: 08/21/2017 20:20   Mr [place] ZO Contrast  Result Date: 08/21/2017  IMPRESSION: 1. Reduced diffusion of the posterior occipital lobe involving cortex and subcortical white matter and tandem segments of high-grade stenosis of right P1/P2. Findings suggest PRES/RCVS. Probable differential is acute/early subacute ischemic infarction due to atherosclerotic/thromboembolic disease. 2. No abnormal finding of the orbits. 3. Otherwise unremarkable MRI of the brain. These results were called by telephone at the time of interpretation on 08/21/2017 at 8:15 pm to Dr. Renne Crigler , who verbally acknowledged these results. Electronically Signed   By: Mitzi Hansen M.D.   On: 08/21/2017 20:20   Discussed with the radiologist again and he agrees that this looks more like a stroke tham PRES  Assessment and plan discussed with with attending physician and they are in agreement.    Felicie Morn PA-C [facility] [phone]  08/22/2017, 10:08 AM   Assessment: 52 y.o. female presented to the hospital with a 3-day history of headache and blurred vision.  MRI was obtained and does show a right occipital infarct.  Oddly enough on physical exam she appears to have a right inferior quadrantanopsia and a right nasolabial fold decrease with no other localizing or lateralizing symptoms on exam.  Given MRI findings stroke workup would be prudent.  Stroke Risk Factors - hypertension and smoking  Recommend 1. HgbA1c, fasting lipid panel 2.  MRI and MRA have already been done 3. PT consult, OT consult, Speech consult 4. Echocardiogram 5. 80 mg of Atorvistatin 6. Prophylactic therapy-Antiplatelet med: Aspirin 325 mg daily 7. Risk factor modification 8. Telemetry monitoring 9. Frequent neuro checks 10 NPO until passes stroke swallow screen 11 please page stroke NP  Or  PA  Or MD from 8am -4 pm  as this patient from this time will be  followed by the stroke.   You can look them up on [email or web address]  Password [facility]  Attending Neurohospitalist Addendum Patient seen and examined with APP/Resident. Agree with the history and physical as documented above. Agree with the plan as documented, which I helped formulate. I have independently reviewed the chart, obtained  history, review of systems and examined the patient.I have personally reviewed pertinent head/neck/spine imaging (CT/MRI). MRI brain shows right occipital area of restricted diffusion, more consistent with stroke than PRES (because of the vascular stenosis in the PCA on the same side and patient not hypertensive on arrival to the point that would make me suspect PRES. Also lesion is unilateral making stroke more likely) Stroke w/u as above. Please feel free to call with any questions. --- Milon Dikes, MD [facility] Pager: [phone]  If 7pm to 7am, please call on call as listed on AMION.

## 2017-08-22 NOTE — ED Triage Notes (Signed)
Pt was here yesterday and had MRI done for headaches and vision change. Pt was told MRI results were +CVA. Pt left ama but has returned this am for admission. Pt denies any new symptoms. Pt has headache, grips are equal, no arm drift. +Facial droop which pt states was present yesterday also.

## 2017-08-23 ENCOUNTER — Observation Stay (HOSPITAL_BASED_OUTPATIENT_CLINIC_OR_DEPARTMENT_OTHER): Payer: Self-pay

## 2017-08-23 DIAGNOSIS — Z7982 Long term (current) use of aspirin: Secondary | ICD-10-CM

## 2017-08-23 DIAGNOSIS — I639 Cerebral infarction, unspecified: Secondary | ICD-10-CM

## 2017-08-23 DIAGNOSIS — Z79899 Other long term (current) drug therapy: Secondary | ICD-10-CM

## 2017-08-23 LAB — HEMOGLOBIN A1C
HEMOGLOBIN A1C: 4.4 % — AB (ref 4.8–5.6)
Mean Plasma Glucose: 79.58 mg/dL

## 2017-08-23 LAB — LIPID PANEL
CHOLESTEROL: 159 mg/dL (ref 0–200)
HDL: 69 mg/dL (ref >40)
LDL CALC: 71 mg/dL (ref 0–99)
TRIGLYCERIDES: 97 mg/dL (ref <150)
Total CHOL/HDL Ratio: 2.3 RATIO
VLDL: 19 mg/dL (ref 0–40)

## 2017-08-23 LAB — HIV ANTIBODY (ROUTINE TESTING W REFLEX): HIV SCREEN 4TH GENERATION: NONREACTIVE

## 2017-08-23 MED ORDER — ATORVASTATIN CALCIUM 10 MG PO TABS: 10.0000 mg | ORAL_TABLET | Freq: Every day | ORAL | Status: DC

## 2017-08-23 MED ORDER — ASPIRIN 81 MG PO TABS: 325.0000 mg | ORAL_TABLET | Freq: Every day | ORAL | 0 refills | Status: DC

## 2017-08-23 MED ORDER — ASPIRIN 81 MG PO TABS: 81.0000 mg | ORAL_TABLET | Freq: Every day | ORAL | 0 refills | Status: DC

## 2017-08-23 MED ORDER — ATORVASTATIN CALCIUM 40 MG PO TABS: 40.0000 mg | ORAL_TABLET | Freq: Every day | ORAL | 0 refills | Status: DC

## 2017-08-23 MED ORDER — ASPIRIN 325 MG PO TABS: 325.0000 mg | ORAL_TABLET | Freq: Every day | ORAL | 0 refills | Status: DC

## 2017-08-23 NOTE — Progress Notes (Signed)
Occupational Therapy Evaluation Patient Details Name: Diana Harris MRN: [id number] DOB: 11/23/1965 Today's Date: 08/23/2017    History of Present Illness Pt is a 52 y/o female admitted secondary to blurred/double vision. MRI revealed R posterior occipital infarct. PMH including but not limited to tobacco abuse.   Clinical Impression   PTA, pt lived with a friend and was independent with ADL and mobility. Pt works part time at [employer] and uses [employer] for transportation. Pt states her vision has improved. Although she continues to complain of some "blurred vision:, especially in her L field. Pt able to read without difficulty. Will follow acutley to educate on strategies to compensate for blurred vision. Educated pt on warning signs/symptoms of CVA using [employer]. Pt appreciative.     Follow Up Recommendations  No OT follow up;Supervision - Intermittent    Equipment Recommendations  None recommended by OT    Recommendations for Other Services       Precautions / Restrictions Precautions Precautions: Other (comment)(reduced visual field) Restrictions Weight Bearing Restrictions: No      Mobility Bed Mobility Overal bed mobility: Modified Independent                Transfers Overall transfer level: Modified independent                    Balance Overall balance assessment: No apparent balance deficits (not formally assessed)                                         ADL either performed or assessed with clinical judgement   ADL Overall ADL's : At baseline                                             Vision Baseline Vision/History: Wears glasses Wears Glasses: Reading only Patient Visual Report: Blurring of vision Vision Assessment?: Yes Eye Alignment: Within Functional Limits Ocular Range of Motion: Within Functional Limits Alignment/Gaze Preference: Within Defined Limits Tracking/Visual Pursuits:  Decreased smoothness of horizontal tracking;Decreased smoothness of vertical tracking Saccades: Additional eye shifts occurred during testing Convergence: Within functional limits Visual Fields: Left visual field deficit Additional Comments: Pt states vision has improved but continues to complain of some "blurriness". Pt able to read using reading glasses; most likely needs increased strength of glasses per observation; reducined visual attention to L field but able to identify targets in L field     Perception     Praxis Praxis Praxis tested?: Within functional limits    Pertinent Vitals/Pain Pain Assessment: No/denies pain     Hand Dominance Right   Extremity/Trunk Assessment Upper Extremity Assessment Upper Extremity Assessment: Overall WFL for tasks assessed   Lower Extremity Assessment Lower Extremity Assessment: Overall WFL for tasks assessed   Cervical / Trunk Assessment Cervical / Trunk Assessment: Normal   Communication Communication Communication: No difficulties   Cognition Arousal/Alertness: Awake/alert Behavior During Therapy: WFL for tasks assessed/performed Overall Cognitive Status: Within Functional Limits for tasks assessed                                     General Comments       Exercises  Shoulder Instructions      Home Living Family/patient expects to be discharged to:: Private residence Living Arrangements: Non-relatives/Friends Available Help at Discharge: Family;Available PRN/intermittently Type of Home: House Home Access: Level entry     Home Layout: One level     Bathroom Shower/Tub: Chief Strategy OfficerTub/shower unit   Bathroom Toilet: Standard Bathroom Accessibility: No   Home Equipment: None          Prior Functioning/Environment Level of Independence: Independent        Comments: works part time at [employer]        OT Problem List: Impaired vision/perception      OT Treatment/Interventions:  Self-care/ADL training;Therapeutic activities;Visual/perceptual remediation/compensation;Patient/family education    OT Goals(Current goals can be found in the care plan section) Acute Rehab OT Goals Patient Stated Goal: return home OT Goal Formulation: With patient Time For Goal Achievement: 09/06/17 Potential to Achieve Goals: Good  OT Frequency: Min 2X/week   Barriers to D/C:            Co-evaluation              AM-PAC PT "6 Clicks" Daily Activity     Outcome Measure Help from another person eating meals?: None Help from another person taking care of personal grooming?: None Help from another person toileting, which includes using toliet, bedpan, or urinal?: None Help from another person bathing (including washing, rinsing, drying)?: None Help from another person to put on and taking off regular upper body clothing?: None Help from another person to put on and taking off regular lower body clothing?: None 6 Click Score: 24   End of Session Nurse Communication: Mobility status  Activity Tolerance: Patient tolerated treatment well Patient left: in bed;with call bell/phone within reach  OT Visit Diagnosis: Low vision, both eyes (H54.2)                Time: [id number] OT Time Calculation (min): 19 min Charges:  OT General Charges $OT Visit: 1 Visit OT Evaluation $OT Eval Low Complexity: 1 Low G-CodesBurnett Corrente:    Maximilian Tallo,HILLARY  Bethzy Hauck, OT/L  [phone] 08/23/2017 08/23/2017, 9:06 AM

## 2017-08-23 NOTE — Evaluation (Signed)
Speech Language Pathology Evaluation Patient Details Name: Diana CruelValerie D Gahm MRN: [id number] DOB: 04/13/1966 Today's Date: 08/23/2017 Time: [id number] SLP Time Calculation (min) (ACUTE ONLY): 12 min  Problem List:  Patient Active Problem List   Diagnosis Date Noted  . Ischemic stroke (HCC) 08/22/2017  . Tobacco use disorder 08/22/2017   Past Medical History:  Past Medical History:  Diagnosis Date  . Ischemic stroke (HCC) 08/22/2017   Right posterior occipital ischemic stroke/notes 08/22/2017  . Tobacco abuse    Past Surgical History:  Past Surgical History:  Procedure Laterality Date  . CESAREAN SECTION    . CESAREAN SECTION  1985   HPI:  Pt is a 52 y/o female admitted secondary to blurred/double vision. MRI revealed R posterior occipital infarct. PMH including but not limited to tobacco abuse.   Assessment / Plan / Recommendation Clinical Impression   Pt presents with grossly intact cognitive-linguistic function for all tasks assessed.  CN exam was unremarkable for areas of focal weakness or impaired sensation.  She reports her vision is back to baseline and could read labels from objects in her room.  Her speech was fluent and free from dysarthria or word finding impairment.  Attention, memory, and problem solving were appropriate for evaluation measures.  As a result, no further ST needs are indicated at this time.  All questions were answered to pt's satisfaction at this time    SLP Assessment  SLP Recommendation/Assessment: Patient does not need any further Speech Lanaguage Pathology Services    Follow Up Recommendations  None          SLP Evaluation Cognition  Overall Cognitive Status: Within Functional Limits for tasks assessed       Comprehension  Auditory Comprehension Overall Auditory Comprehension: Appears within functional limits for tasks assessed    Expression Expression Primary Mode of Expression: Verbal Verbal Expression Overall Verbal Expression:  Appears within functional limits for tasks assessed Written Expression Dominant Hand: Right   Oral / Motor  Oral Motor/Sensory Function Overall Oral Motor/Sensory Function: Within functional limits Motor Speech Overall Motor Speech: Appears within functional limits for tasks assessed   GO          Functional Assessment Tool Used: cognitive-linguistic assessment Functional Limitations: Spoken language expressive Spoken Language Expression Current Status ([id number](G9162): 0 percent impaired, limited or restricted Spoken Language Expression Goal Status (F6213(G9163): 0 percent impaired, limited or restricted Spoken Language Expression Discharge Status [phone](G9164): 0 percent impaired, limited or restricted         Maryjane HurterPage, Calbert Hulsebus L 08/23/2017, 9:46 AM

## 2017-08-23 NOTE — Progress Notes (Addendum)
NEUROHOSPITALISTS STROKE TEAM - DAILY PROGRESS NOTE   ADMISSION HISTORY: Lawerance CruelValerie D Benedict is an 52 y.o. female who has a significant history for tobacco abuse, drinking alcohol who arrived at the hospital yesterday for evaluation of headache and spots in her vision.  Patient did have an MRI which consisted of brain, orbits and MRA of head.  The MRI revealed a right posterior occipital infarct.  Unfortunately due to waiting a significant amount of time patient left the emergency room.  Patient returns today to have further evaluation as she is nervous about her symptoms.  Patient still complains about mild headache and spots in her vision.  Date last known well: Date: 08/18/2017 Time last known well: Unable to determine tPA Given: No: Out of the window NIH stroke scale of 1 Modified Rankin: Rankin Score=0  SUBJECTIVE (INTERVAL HISTORY) No family is at the bedside. Patient is found laying in bed in NAD. Overall she feels her condition is gradually improving. Voices no new complaints. No new events reported overnight. States her H/A and vision has improved overnight. Counseled to stop using drugs and to stop smoking   OBJECTIVE Lab Results: CBC:  Recent Labs  Lab 08/21/17 1706 08/22/17 0946  WBC 2.7* 2.7*  HGB 14.9 13.3  HCT 41.3 37.6  MCV 93.7 94.2  PLT 177 159   BMP: Recent Labs  Lab 08/21/17 1706 08/22/17 0946  NA 140 138  K 4.1 3.8  CL 106 109  CO2 25 21*  GLUCOSE 90 115*  BUN 12 12  CREATININE 0.72 0.77  CALCIUM 9.4 9.0   Liver Function Tests:  Recent Labs  Lab 08/21/17 1706 08/22/17 0946  AST 78* 62*  ALT 102* 79*  ALKPHOS 71 73  BILITOT 0.8 0.8  PROT 7.1 5.7*  ALBUMIN 3.9 3.1*   Coagulation Studies:  Recent Labs    08/22/17 0946  APTT 31  INR 0.94   Urinalysis:  Recent Labs  Lab 08/22/17 0913  COLORURINE YELLOW  APPEARANCEUR CLEAR  LABSPEC 1.018  PHURINE 5.0  GLUCOSEU NEGATIVE  HGBUR  NEGATIVE  BILIRUBINUR NEGATIVE  KETONESUR NEGATIVE  PROTEINUR NEGATIVE  NITRITE NEGATIVE  LEUKOCYTESUR TRACE*   Urine Drug Screen:     Component Value Date/Time   LABOPIA NONE DETECTED 08/22/2017 0913   COCAINSCRNUR POSITIVE (A) 08/22/2017 0913   LABBENZ NONE DETECTED 08/22/2017 0913   AMPHETMU NONE DETECTED 08/22/2017 0913   THCU NONE DETECTED 08/22/2017 0913   LABBARB NONE DETECTED 08/22/2017 0913    Alcohol Level:  Recent Labs  Lab 08/22/17 0913  ETH <10    PHYSICAL EXAM Temp:  [97.5 F (36.4 C)-99.1 F (37.3 C)] 97.6 F (36.4 C) (02/21 0421) Pulse Rate:  [51-73] 63 (02/21 0421) Resp:  [16-25] 18 (02/21 0421) BP: (126-169)/(67-98) 152/92 (02/21 0421) SpO2:  [99 %-100 %] 100 % (02/21 0421) General - frail middle-aged African-American lady in no apparent distress HEENT-  Normocephalic,  Cardiovascular - Regular rate and rhythm  Respiratory - Lungs clear bilaterally. No wheezing. Abdomen - soft and non-tender, BS normal Extremities- no edema or cyanosis Mental Status: Alert, oriented, thought content appropriate.  Speech fluent without evidence of aphasia.  Able to follow 3 step commands without difficulty. Cranial Nerves: II: Appears to have decreased vision in the right inferior lateral quadrant, improving III,IV, VI: ptosis not present, extra-ocular motions intact bilaterally, pupils equal, round, reactive to light and accommodation V,VII: smile symmetric, right nasolabial fold decrease - improving  facial light touch sensation normal bilaterally VIII: hearing  normal bilaterally IX,X: uvula rises symmetrically XI: bilateral shoulder shrug XII: midline tongue extension Motor: Right : Upper extremity   5/5    Left:     Upper extremity   5/5  Lower extremity   5/5     Lower extremity   5/5 Tone and bulk:normal tone throughout; no atrophy noted Sensory: Pinprick and light touch intact throughout, bilaterally Deep Tendon Reflexes: 2+ and symmetric  throughout Plantars: Right: downgoing   Left: downgoing Cerebellar: normal finger-to-nose,  and normal heel-to-shin test Gait: Not tested  IMAGING: I have personally reviewed the radiological images below and agree with the radiology interpretations. Ct Head Wo Contrast Result Date: 08/21/2017 IMPRESSION: Normal head CT. Electronically Signed   By: Lupita Raider, M.D.   On: 08/21/2017 14:09   Mr Angiogram Head Wo Contrast Mr Brain Wo Contrast Mr [place] ZO Contrast Result Date: 08/21/2017  IMPRESSION: 1. Reduced diffusion of the posterior occipital lobe involving cortex and subcortical white matter and tandem segments of high-grade stenosis of right P1/P2. Findings suggest PRES/RCVS. Probable differential is acute/early subacute ischemic infarction due to atherosclerotic/thromboembolic disease. 2. No abnormal finding of the orbits. 3. Otherwise unremarkable MRI of the brain.   Echocardiogram:                                              Study Conclusions - Left ventricle: The cavity size was normal. Wall thickness was   normal. Systolic function was normal. The estimated ejection   fraction was in the range of 60% to 65%. Wall motion was normal;   there were no regional wall motion abnormalities. Doppler   parameters are consistent with abnormal left ventricular   relaxation (grade 1 diastolic dysfunction). - Mitral valve: Mildly thickened leaflets . There was trivial   regurgitation. Impressions:- No cardiac source of emboli was indentified.  B/L Carotid U/S:   1-39% internal carotid artery stenosis bilaterally. Vertebral arteries are patent with antegrade flow.                                                 IMPRESSION: Ms. ANICKA STUCKERT is a 52 y.o. female with PMH of Polysubstance abuse and smoking who presented to the hospital with a 3-day history of headache and blurred vision.  MRI was obtained and does show a right occipital infarct/ area of restricted diffusion, more  consistent with stroke than PRES (because of the vascular stenosis in the PCA on the same side and patient not hypertensive on arrival to the point that would make me suspect PRES. Also lesion is unilateral making stroke more likely.   Acute Right posterior occipital infarct.  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 Resultant Symptoms: headache and spots in her vision Stroke Risk Factors: hyperlipidemia and hypertension Other Stroke Risk Factors: Advanced age, Cigarette smoker, ETOH use, Polysubstance Abuse, Morbid Obesity,   Outstanding Stroke Work-up Studies:    Work up completed  PLAN  08/23/2017: Continue Aspirin/ Statin Frequent neuro checks Telemetry monitoring PT/OT/SLP Ongoing aggressive stroke risk factor management Patient counseled to be compliant with her antithrombotic medications Patient counseled on Lifestyle modifications including, Diet, Exercise, and Stress Follow up with [facility] in 6 weeks  HYPERTENSION: Stable  Permissive hypertension (OK if <220/120) for 24-48 hours post stroke and then gradually normalized within 5-7 days. Long term BP goal normotensive. May slowly start B/P medications after 48 hours, if necessary Home Meds: NONE  HYPERLIPIDEMIA:    Component Value Date/Time   CHOL 159 08/23/2017 0420   TRIG 97 08/23/2017 0420   HDL 69 08/23/2017 0420   CHOLHDL 2.3 08/23/2017 0420   VLDL 19 08/23/2017 0420   LDLCALC 71 08/23/2017 0420  Home Meds:  NONE LDL  goal < 70 Started on Lipitor to 10 mg daily Continue statin at discharge  DIABETES: Lab Results  Component Value Date   HGBA1C 4.4 (L) 08/23/2017  HgbA1c goal < 7.0  TOBACCO ABUSE & POLYSUBSTANCE ABUSE UDS+ Cocaine Current smoker Smoking cessation counseling provided Nicotine patch provided  OBESITY Morbid Obesity, 56.7 Kg  Other Active Problems: Principal Problem:   Ischemic stroke (HCC) Active Problems:   Tobacco use disorder    Hospital day #  0 VTE prophylaxis: Lovenox Diet : Fall precautions Diet regular Room service appropriate? Yes; Fluid consistency: Thin   FAMILY UPDATES: No family at bedside  TEAM UPDATES: Doneen Poisson, MD   Prior Home Stroke Medications:  No antithrombotic  Discharge Stroke Meds:  Please discharge patient on aspirin 325 mg daily   Disposition: 01-Home or Self Care Therapy Recs:               HOME Follow Up:  Follow-up Information    Micki Riley, MD. Schedule an appointment as soon as possible for a visit in 6 week(s).   Specialties:  Neurology, Radiology Contact information: [address] [place] [address] [phone]          Patient, No Pcp Per -PCP Follow up in 1-2 weeks   Case Management aware of need   Assessment & plan discussed with with attending physician and they are in agreement.    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 Patient was counseled to quit drugs and smoking cigarettes and to maintain aggressive risk factor modification. Start aspirin. Greater than 50% time during this 25 minute visit was spent on counseling and coordination of care about his stroke and discussion about stroke risk factors modification and answered questions  Neurology to sign-off at this time. Please call with any further questions or concerns. Thank you for this consultation. Delia Heady, MD Medical Director [facility] Stroke Center Pager: [phone] 08/23/2017 5:00 PM To contact Stroke Continuity provider, please refer to [email or web address]. After hours, contact General Neurology

## 2017-08-23 NOTE — Discharge Summary (Signed)
Name: Diana Harris MRN: [id number] DOB: [birth year] 52 y.o. PCP: Patient, No Pcp Per  Date of Admission: 08/22/2017  8:27 AM Date of Discharge: 08/23/2017 Attending Physician: Doneen Poisson, MD  Discharge Diagnosis:  Principal Problem:   Ischemic stroke [facility]) Active Problems:   Tobacco use disorder   Discharge Medications: Allergies as of 08/23/2017   No Known Allergies     Medication List    TAKE these medications   aspirin 81 MG tablet Take 1 tablet (81 mg total) by mouth daily.   atorvastatin 40 MG tablet Commonly known as:  LIPITOR Take 1 tablet (40 mg total) by mouth daily at 6 PM.   ibuprofen 200 MG tablet Commonly known as:  ADVIL,MOTRIN Take 400 mg by mouth every 6 (six) hours as needed for headache or moderate pain.      Disposition and follow-up:   Diana Harris was discharged from [facility] in Stable condition.  At the hospital follow up visit please address:  1. Ischemic Stroke: Evaluate for any changes in LL Field vision loss. Ensure patient is able to obtain her atorvastatin and ASA. Provide further counseling on tobacco and cocaine cessation. Ensure follow up with neurology.  2.  Labs / imaging needed at time of follow-up: None  3.  Pending labs/ test needing follow-up: None  Follow-up Appointments: Follow-up Information    Micki Riley, MD. Schedule an appointment as soon as possible for a visit in 6 week(s).   Specialties:  Neurology, Radiology Contact information: [address] [place] [address] [phone]           Hospital Course by problem list: Principal Problem:   Ischemic stroke [facility]) Active Problems:   Tobacco use disorder   1. Ischemic Stroke: Patient presented to the ED for headache and visual blurring and floaters for 4 days. She had initially been seen in the ED the day prior to this presentation and underwent MRI/MRA that demonstrated a right posterior occipital acute or  subacute stroke. During that first ED stay, her headache improved partially and she left after feeling she couldn't wait any longer but returned to the ED the following day due to persistent vision changes and medical advice to undergo workup. She and left lower visual field deficits. She had no other focal neurologic deficits. Patient did well overnight with supportive care. Lipid Panel and A1c were WNL. She passed bedside swallow study and PT evaluation. Echo showed EF 60-65%, No wall motion abnormality, Grade 1 diastolic dysfunction, and no source of emboli. She was counseled on tobacco and cocaine cessation. She was discharged on atorvastatin 40mg  Daily and ASA 81mg  Daily.  Discharge Vitals:   BP 131/74 (BP Location: Left Arm)   Pulse (!) 59   Temp 97.9 F (36.6 C) (Oral)   Resp 19   SpO2 99%   Pertinent Labs, Studies, and Procedures:  CBC Latest Ref Rng & Units 08/22/2017 08/21/2017  WBC 4.0 - 10.5 K/uL 2.7(L) 2.7(L)  Hemoglobin 12.0 - 15.0 g/dL 10.2 72.5  Hematocrit 36.6 - 46.0 % 37.6 41.3  Platelets 150 - 400 K/uL 159 177   BMP Latest Ref Rng & Units 08/22/2017 08/21/2017  Glucose 65 - 99 mg/dL 440(H) 90  BUN 6 - 20 mg/dL 12 12  Creatinine 4.74 - 1.00 mg/dL 2.59 5.63  Sodium 875 - 145 mmol/L 138 140  Potassium 3.5 - 5.1 mmol/L 3.8 4.1  Chloride 101 - 111 mmol/L 109 106  CO2 22 -  32 mmol/L 21(L) 25  Calcium 8.9 - 10.3 mg/dL 9.0 9.4   Lipid Panel     Component Value Date/Time   CHOL 159 08/23/2017 0420   TRIG 97 08/23/2017 0420   HDL 69 08/23/2017 0420   CHOLHDL 2.3 08/23/2017 0420   VLDL 19 08/23/2017 0420   LDLCALC 71 08/23/2017 0420   Hemoglobin A1C:  4.4  Troponin: 0.00  EKG Interpretation Date/Time:  Wednesday August 22 2017 11:07:18 EST Ventricular Rate:  60 PR Interval:    QRS Duration: 81 QT Interval:  434 QTC Calculation: 434 R Axis:   65 Text Interpretation:  Sinus rhythm Borderline T wave abnormalities No prior for comparison Confirmed by Ross MarcusHorton,  Courtney ([id number]) on 08/23/2017 11:10:19 AM  Echocardiogram: Study Conclusions: - Left ventricle: The cavity size was normal. Wall thickness was normal. Systolic function was normal. The estimated ejection fraction was in the range of 60% to 65%. Wall motion was normal; there were no regional wall motion abnormalities. Doppler parameters are consistent with abnormal left ventricular relaxation (grade 1 diastolic dysfunction). - Mitral valve: Mildly thickened leaflets . There was trivial regurgitation. - No cardiac source of emboli was indentified.  Carotid Ultrasound: Interpretation: - Right Carotid: Velocities in the right ICA are consistent with a 1-39% stenosis. - Left Carotid: Velocities in the left ICA are consistent with a 1-39% stenosis. - Vertebrals: Both vertebral arteries were patent with antegrade flow. - Subclavians: Normal flow hemodynamics were seen in bilateral subclavian arteries.  CT Head: IMPRESSION: Normal head CT.  MRI/MRA Brain/Orbits: IMPRESSION: 1. Reduced diffusion of the posterior occipital lobe involving cortex and subcortical white matter and tandem segments of high-grade stenosis of right P1/P2. Findings suggest PRES/RCVS. Probable differential is acute/early subacute ischemic infarction due to atherosclerotic/thromboembolic disease. 2. No abnormal finding of the orbits. 3. Otherwise unremarkable MRI of the brain.  Discharge Instructions: Discharge Instructions    Ambulatory referral to Neurology   Complete by:  As directed    An appointment is requested in approximately:6 weeks Follow up with stroke clinic (Dr Pearlean BrownieSethi preferred, if not available, then consider Sylvie FarrierXu[facility] or Lucia GaskinsAhern whoever is available) [facility] in about 6-8 weeks. Thanks.   Call MD for:  difficulty breathing, headache or visual disturbances   Complete by:  As directed    Call MD for:  extreme fatigue   Complete by:  As directed    Call MD for:  hives   Complete by:  As directed     Call MD for:  persistant dizziness or light-headedness   Complete by:  As directed    Call MD for:  persistant nausea and vomiting   Complete by:  As directed    Call MD for:  severe uncontrolled pain   Complete by:  As directed    Diet - low sodium heart healthy   Complete by:  As directed    Discharge instructions   Complete by:  As directed    Thank you for allowing us to care for you  Your symptoms were due to a stroke: - Please start taking Aspirin 81mg  Daily and Atorvastatin 40mg  Daily, for protection against future strokes - Please work on smoking cessation and cocaine cessation  Please establish with a primary care provider and follow up in the next couple of weeks - We will schedule an appointment for you and will be called by our clinic to confirm - The clinic is called "[facility]" and the phone number is [phone] -  You are scheduled for Feb 27th @ 10:45  Please follow up with Neurology as scheduled by them  Contact a medical professional if you experience a return of severe symptoms.   Increase activity slowly   Complete by:  As directed       Signed: Beola Cord, MD 08/23/2017, 12:31 PM   Pager: [phone]

## 2017-08-23 NOTE — Progress Notes (Addendum)
   Subjective: [name] was seen resting comfortably in her bed this morning. She states she is feeling well, but continues endorse vision changes. Her diagnosis was discussed with her. She was informed that she would need to start taking a Statin and Daily aspirin for secondary stroke prevention. She was also counseled on smoking cessation and cocaine cessation as this can also contribute to risk of future stroke and other diseases. Patient agreed to plan. She does not currently have a PCP and she was agreeable to follow up in [facility] here. She will be provided with an appointment and clinic information. No further questions or concerns this morning.  Objective:  Vital signs in last 24 hours: Vitals:   08/22/17 1732 08/22/17 2000 08/23/17 0005 08/23/17 0421  BP: (!) 169/98 (!) 143/85 126/75 (!) 152/92  Pulse: (!) 52 (!) 58 (!) 58 63  Resp: 18 18 18 18   Temp: (!) 97.5 F (36.4 C) 98 F (36.7 C) 98.5 F (36.9 C) 97.6 F (36.4 C)  TempSrc: Oral Oral Oral Oral  SpO2: 100% 99% 100% 100%   Physical Exam  Constitutional: She is oriented to person, place, and time. She appears well-developed and well-nourished.  HENT:  Head: Normocephalic and atraumatic.  Eyes: EOM are normal. Right eye exhibits no discharge. Left eye exhibits no discharge.  Cardiovascular: Normal rate, regular rhythm, normal heart sounds and intact distal pulses.  Pulmonary/Chest: Effort normal and breath sounds normal. No respiratory distress.  Abdominal: Soft. Bowel sounds are normal. She exhibits no distension. There is no tenderness.  Musculoskeletal: She exhibits no edema or deformity.  Neurological: She is alert and oriented to person, place, and time.  Left Lower visual field defect, mild Left upper visual field defect, right visual fields normal CN II-XII grossly intact Strength and Sensation grossly intact Bilat Upper and Lower extremites  Skin: Skin is warm and dry.  Psychiatric: She has a  normal mood and affect.   Assessment/Plan: 52 year old woman with a history of tobacco use who presented to the ED 2/19 and again 2/20 for headache and visual blurring and floaters for 4 days.  Right posterior occipital ischemic stroke: MRI findings show acute/subacute stroke of right occipital lobe and proximal ipsilateral vessel stenosis. Lesion is consistent with left visual field deficit. Patient counseled on tobacco and cocaine cessation. - TTE showed EF 60-65%, G1DD, No source of embolism - Lipid profile: WNL (Total 159, TG 97, HDL 69, LDL 71) - Hgb A1c: 4.4 - PT recommends no follow up - OT recommends no follow up - Passed Bedside swallow - ASA 325mg  daily - Atorvastatin 40mg   Tobacco use: Patient advised that tobacco cessation would help reduce her risk of recurrent stoke as well as reduce her risk for other diseases.  Diet: Regular VTE ppx: Enoxaparin FULL CODE  Dispo: Anticipated discharge Today.  Beola CordMelvin, Alexander, MD 08/23/2017, 6:29 AM Pager: [phone]

## 2017-08-23 NOTE — Progress Notes (Signed)
Patient discharged home. Discharge instructions were reviewed with the patient. Patient verbalized understanding.  

## 2017-08-23 NOTE — Progress Notes (Signed)
*  PRELIMINARY RESULTS* Vascular Ultrasound Carotid Duplex (Doppler) has been completed.   Findings suggest 1-39% internal carotid artery stenosis bilaterally. Vertebral arteries are patent with antegrade flow.  08/23/2017 9:23 AM Gertie FeyMichelle Elidia Bonenfant, BS, RVT, RDCS, RDMS

## 2017-08-23 NOTE — Care Management Note (Signed)
Case Management Note  Patient Details  Name: Diana Harris MRN: [id number] Date of Birth: 10/15/1965  Subjective/Objective:      Pt admitted with CVA. She is from home alone. Pt does not have PCP or insurance.               Action/Plan: Pt discharging home with self care. [facility]. CM provided her coupon to assist with the cost of her medications. Pt states her daughter will provide transportation home. No f/u per PT/OT and no DME needs.   Expected Discharge Date:  08/23/17               Expected Discharge Plan:  Home/Self Care  In-House Referral:     Discharge planning Services  CM Consult, Medication Assistance  Post Acute Care Choice:    Choice offered to:     DME Arranged:    DME Agency:     HH Arranged:    HH Agency:     Status of Service:  Completed, signed off  If discussed at [employer] Length of Stay Meetings, dates discussed:    Additional Comments:  Kermit BaloKelli F Davide Risdon, RN 08/23/2017, 10:44 AM

## 2017-08-29 ENCOUNTER — Telehealth: Payer: Self-pay | Admitting: General Practice

## 2017-08-29 ENCOUNTER — Other Ambulatory Visit: Payer: Self-pay

## 2017-08-29 ENCOUNTER — Ambulatory Visit (INDEPENDENT_AMBULATORY_CARE_PROVIDER_SITE_OTHER): Payer: Self-pay | Admitting: Internal Medicine

## 2017-08-29 VITALS — BP 132/77 | HR 63 | Temp 97.6°F | Ht 62.0 in | Wt 124.2 lb

## 2017-08-29 DIAGNOSIS — Z7982 Long term (current) use of aspirin: Secondary | ICD-10-CM

## 2017-08-29 DIAGNOSIS — Z79899 Other long term (current) drug therapy: Secondary | ICD-10-CM

## 2017-08-29 DIAGNOSIS — Z8673 Personal history of transient ischemic attack (TIA), and cerebral infarction without residual deficits: Secondary | ICD-10-CM

## 2017-08-29 DIAGNOSIS — Z09 Encounter for follow-up examination after completed treatment for conditions other than malignant neoplasm: Secondary | ICD-10-CM

## 2017-08-29 DIAGNOSIS — F17200 Nicotine dependence, unspecified, uncomplicated: Secondary | ICD-10-CM

## 2017-08-29 DIAGNOSIS — F17210 Nicotine dependence, cigarettes, uncomplicated: Secondary | ICD-10-CM

## 2017-08-29 DIAGNOSIS — F1490 Cocaine use, unspecified, uncomplicated: Secondary | ICD-10-CM

## 2017-08-29 DIAGNOSIS — I639 Cerebral infarction, unspecified: Secondary | ICD-10-CM

## 2017-08-29 MED ORDER — NICOTINE 14 MG/24HR TD PT24: 14.0000 mg | MEDICATED_PATCH | Freq: Every day | TRANSDERMAL | 0 refills | Status: DC

## 2017-08-29 MED ORDER — NICOTINE 14 MG/24HR TD PT24: 14.0000 mg | MEDICATED_PATCH | Freq: Every day | TRANSDERMAL | 0 refills | Status: AC

## 2017-08-29 MED ORDER — ATORVASTATIN CALCIUM 40 MG PO TABS: 40.0000 mg | ORAL_TABLET | Freq: Every day | ORAL | 0 refills | Status: DC

## 2017-08-29 MED ORDER — ACETAMINOPHEN 500 MG PO TABS: 1000.0000 mg | ORAL_TABLET | Freq: Three times a day (TID) | ORAL | 1 refills | Status: DC | PRN

## 2017-08-29 MED ORDER — ASPIRIN 81 MG PO TABS: 81.0000 mg | ORAL_TABLET | Freq: Every day | ORAL | 0 refills | Status: DC

## 2017-08-29 MED ORDER — ACETAMINOPHEN 500 MG PO TABS: 1000.0000 mg | ORAL_TABLET | Freq: Three times a day (TID) | ORAL | 1 refills | Status: AC | PRN

## 2017-08-29 MED ORDER — NICOTINE POLACRILEX 2 MG MT GUM: CHEWING_GUM | OROMUCOSAL | 1 refills | Status: DC

## 2017-08-29 NOTE — Assessment & Plan Note (Signed)
Smoking 1/2 pack/day since age of 52.  I discussed smoking cessation and patient was interested in quitting.  Plan -Advised her to set a quit date and start using nicotine patches and gum. NicoDerm 14 mg daily patch Nicorette 2 mg gum as needed -Reassess progress at follow-up visit in 6 weeks

## 2017-08-29 NOTE — Progress Notes (Signed)
   CC: Hospital follow-up of stroke  HPI:  Ms.Diana Harris is a 52 y.o. female with a past medical history of conditions listed below presenting to the clinic for a hospital follow-up of stroke. Please see problem based charting for the status of the patient's current and chronic medical conditions.   Past Medical History:  Diagnosis Date  . Ischemic stroke (HCC) 08/22/2017   Right posterior occipital ischemic stroke/notes 08/22/2017  . Tobacco abuse    Review of Systems: Pertinent positives mentioned in HPI. Remainder of all ROS negative.   Physical Exam:  Vitals:   08/29/17 1115  BP: 132/77  Pulse: 63  Temp: 97.6 F (36.4 C)  TempSrc: Oral  SpO2: 100%  Weight: 124 lb 3.2 oz (56.3 kg)  Height: 5\' 2"  (1.575 m)   Physical Exam  Constitutional: She is oriented to person, place, and time. She appears well-developed and well-nourished. No distress.  HENT:  Head: Normocephalic and atraumatic.  Mouth/Throat: Oropharynx is clear and moist. No oropharyngeal exudate.  Eyes: EOM are normal. Pupils are equal, round, and reactive to light.  Cardiovascular: Normal rate, regular rhythm and intact distal pulses.  Pulmonary/Chest: Effort normal and breath sounds normal. No respiratory distress. She has no wheezes. She has no rales.  Abdominal: Soft. Bowel sounds are normal. She exhibits no distension. There is no tenderness.  Musculoskeletal: She exhibits no edema.  Neurological: She is alert and oriented to person, place, and time.  Strength 5 out of 5 and sensation to light touch intact throughout.  Skin: Skin is warm and dry.    Assessment & Plan:   See Encounters Tab for problem based charting.  Patient discussed with Dr. Heide SparkNarendra

## 2017-08-29 NOTE — Telephone Encounter (Signed)
Pharmacy said that patient was saying that we have a coupon for pt medicine

## 2017-08-29 NOTE — Patient Instructions (Addendum)
Ms. Diana IshiharaHerbin it was nice meeting you today.  -Continue taking Aspirin and Atorvastatin as instructed  -Take Tylenol 1000 mg every 8 hours as needed for headaches.  -Our office will make sure you are scheduled to see neurology.  -Please set a quit date for smoking. Use a nicotine patch daily as instructed. You may also chew nicotine gum for cravings as instructed.   FOLLOW-UP INSTRUCTIONS When: 6 weeks  For: Follow of smoking cessation  What to bring: Medications

## 2017-08-29 NOTE — Assessment & Plan Note (Addendum)
Patient was recently admitted to the hospital for headaches, visual blurring and floaters.  She was found to have a posterior occipital acute/ early subacute stroke which was thought to be ischemic in etiology. A1c 4.4 and LDL 71.  Echo did not reveal a source of emboli.  She was discharged on atorvastatin 40 mg daily and aspirin 81 mg daily. Patient continues to take atorvastatin and aspirin daily.  States her vision has improved and floaters have resolved.  She continues to complain of headaches which are located in the right temporal region. Reports continued cocaine use, most recent last night which could be contributing to her headaches.  States she took 2 tablets of ibuprofen and it did not help.  She continues to smoke 1/2 pack/day of cigarettes since the age of 52.  She is not sure about neurology follow-up  and states nobody has called her to set up an appointment.  Plan -Continue aspirin and statin -Our clinic spoke to neurology office and she has been scheduled for an appointment. -Discussed smoking cessation, please read separate note -Advised her to stop using cocaine -Tylenol 1000 mg every 8 hours as needed for headaches

## 2017-08-30 NOTE — Progress Notes (Signed)
Internal Medicine Clinic Attending  Case discussed with Dr. Rathoreat the time of the visit. We reviewed the resident's history and exam and pertinent patient test results. I agree with the assessment, diagnosis, and plan of care documented in the resident's note.  

## 2017-10-03 ENCOUNTER — Ambulatory Visit: Payer: Self-pay | Admitting: Adult Health

## 2017-10-03 ENCOUNTER — Encounter: Payer: Self-pay | Admitting: Adult Health

## 2019-01-17 ENCOUNTER — Encounter (HOSPITAL_COMMUNITY): Payer: Self-pay

## 2019-01-17 ENCOUNTER — Other Ambulatory Visit: Payer: Self-pay

## 2019-01-17 ENCOUNTER — Ambulatory Visit (HOSPITAL_COMMUNITY)
Admission: EM | Admit: 2019-01-17 | Discharge: 2019-01-17 | Disposition: A | Discharge status: Home / Self Care | Payer: Self-pay | Attending: Emergency Medicine | Admitting: Emergency Medicine

## 2019-01-17 DIAGNOSIS — M25511 Pain in right shoulder: Secondary | ICD-10-CM

## 2019-01-17 MED ORDER — KETOROLAC TROMETHAMINE 30 MG/ML IJ SOLN
30.0000 mg | Freq: Once | INTRAMUSCULAR | Status: AC
  Administered 2019-01-17: 30 mg via INTRAMUSCULAR

## 2019-01-17 MED ORDER — MELOXICAM 7.5 MG PO TABS: 7.5000 mg | ORAL_TABLET | Freq: Every day | ORAL | 0 refills | Status: DC

## 2019-01-17 MED ORDER — KETOROLAC TROMETHAMINE 30 MG/ML IJ SOLN
INTRAMUSCULAR | Status: AC
  Filled 2019-01-17: qty 1

## 2019-01-17 MED ORDER — CYCLOBENZAPRINE HCL 5 MG PO TABS: 5.0000 mg | ORAL_TABLET | Freq: Two times a day (BID) | ORAL | 0 refills | Status: DC | PRN

## 2019-01-17 NOTE — ED Triage Notes (Signed)
Patient presents to Urgent Care with complaints of right shoulder pain since about a week ago. Patient reports she does repetitive movement at work. Pt took advil for pain this morning and it helped some.

## 2019-01-17 NOTE — ED Provider Notes (Signed)
[place]    CSN: [id number] Arrival date & time: 01/17/19  8466     History   Chief Complaint Chief Complaint  Patient presents with  . Shoulder Pain    HPI Diana Harris is a 53 y.o. female history of tobacco abuse presenting for right anterior shoulder pain.  Patient states that she woke up about 1 week ago with pain in her right shoulder.  Patient denies injury, trauma to the area.  No known history of seizures.  Patient endorsing intermittent paresthesias to her fingertips.  Denies weakness in her hands, dropping objects.  Patient does have difficulty lifting her arm overhead.  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   Past Medical History:  Diagnosis Date  . Ischemic stroke ([place]) 08/22/2017   Right posterior occipital ischemic stroke/notes 08/22/2017  . Tobacco abuse     Patient Active Problem List   Diagnosis Date Noted  . Ischemic stroke ([place]) 08/22/2017  . Tobacco use disorder 08/22/2017    Past Surgical History:  Procedure Laterality Date  . CESAREAN SECTION    . [place]    OB History   No obstetric history on file.      Home Medications    Prior to Admission medications   Medication Sig Start Date End Date Taking? Authorizing Provider  aspirin 81 MG tablet Take 1 tablet (81 mg total) by mouth daily. 08/29/17   Shela Leff, MD  atorvastatin (LIPITOR) 40 MG tablet Take 1 tablet (40 mg total) by mouth daily at 6 PM. 08/29/17   Shela Leff, MD  cyclobenzaprine (FLEXERIL) 5 MG tablet Take 1 tablet (5 mg total) by mouth 2 (two) times daily as needed for muscle spasms. 01/17/19   Hall-Potvin, [place], PA-C  meloxicam (MOBIC) 7.5 MG tablet Take 1 tablet (7.5 mg total) by mouth daily. 01/17/19   Hall-Potvin, [place], PA-C  nicotine polacrilex (NICORETTE) 2 MG gum Chew 1 piece of gum every 1-2 hours while awake. Do not exceed 24 pieces of gum in a day. 08/29/17    Shela Leff, MD    Family History Family History  Problem Relation Age of Onset  . Hypertension Mother   . Hypertension Father   . Lung cancer Sister   . Prostate cancer Brother     Social History Social History   Tobacco Use  . Smoking status: Current Every Day Smoker    Packs/day: 1.00    Years: 40.00    Pack years: 40.00    Types: Cigarettes  . Smokeless tobacco: Never Used  . Tobacco comment: trying  Substance Use Topics  . Alcohol use: Yes    Alcohol/week: 6.0 standard drinks    Types: 6 Cans of beer per week  . Drug use: No     Allergies   Patient has no known allergies.   Review of Systems Review of Systems  Constitutional: Negative for fatigue and fever.  HENT: Negative for ear pain.   Respiratory: Negative for cough and shortness of breath.   Cardiovascular: Negative for chest pain and palpitations.  Musculoskeletal: Positive for arthralgias. Negative for joint swelling, myalgias, neck pain and neck stiffness.  Skin: Negative for rash and wound.  Neurological: Negative for syncope and headaches.     Physical Exam Triage Vital Signs ED Triage Vitals  Enc Vitals Group     BP 01/17/19 0847 (!) 185/97     Pulse Rate 01/17/19 0847 77  Resp 01/17/19 0847 17     Temp 01/17/19 0847 98 F (36.7 C)     Temp Source 01/17/19 0847 Oral     SpO2 01/17/19 0847 98 %     Weight --      Height --      Head Circumference --      Peak Flow --      Pain Score 01/17/19 0846 10     Pain Loc --      Pain Edu? --      Excl. in GC? --    No data found.  Updated Vital Signs BP (!) 185/97 (BP Location: Left Arm)   Pulse 77   Temp 98 F (36.7 C) (Oral)   Resp 17   SpO2 98%   Visual Acuity Right Eye Distance:   Left Eye Distance:   Bilateral Distance:    Right Eye Near:   Left Eye Near:    Bilateral Near:     Physical Exam Constitutional:      General: She is not in acute distress. HENT:     Head: Normocephalic and atraumatic.  Eyes:      General: No scleral icterus.    Pupils: Pupils are equal, round, and reactive to light.  Cardiovascular:     Rate and Rhythm: Normal rate.  Pulmonary:     Effort: Pulmonary effort is normal.  Musculoskeletal:     Right shoulder: She exhibits decreased range of motion, tenderness, pain and decreased strength. She exhibits no bony tenderness, no swelling, no effusion, no crepitus, no deformity, no laceration and normal pulse.     Comments: Anterior shoulder tenderness, predominantly at bicipital groove without effusion, swelling, ecchymosis.  No shoulder deformity, exam limited second to patient cooperation due to pain.  Negative impingement sign.  Decreased shoulder extension and strength as compared to left, though grip strength 5/5 bilaterally and symmetric.  Skin:    Coloration: Skin is not jaundiced or pale.  Neurological:     Mental Status: She is alert and oriented to person, place, and time.      UC Treatments / Results  Labs (all labs ordered are listed, but only abnormal results are displayed) Labs Reviewed - No data to display  EKG   Radiology No results found.  Procedures Procedures (including critical care time)  Medications Ordered in UC Medications  ketorolac (TORADOL) 30 MG/ML injection 30 mg (30 mg Intramuscular Given 01/17/19 0913)  ketorolac (TORADOL) 30 MG/ML injection (has no administration in time range)    Initial Impression / Assessment and Plan / UC Course  I have reviewed the triage vital signs and the nursing notes.  Pertinent labs & imaging results that were available during my care of the patient were reviewed by me and considered in my medical decision making (see chart for details).     53 year old female presenting for acute concern of right anterior shoulder pain.  History and physical concerning for bicep tendinitis.  No inciting event, x-ray deferred today.  Patient given Toradol in office which she tolerated well.  Will treat with low-dose  Mobic and Flexeril for additional pain relief.  Patient given additional resources should pain not improve/worsen over the next week.  Discussed importance of primary care for further monitoring of blood pressures it was elevated today.  Patient denies alarm symptoms, stable to discharge home.  Return precautions discussed, patient verbalized understanding and is agreeable to plan. Final Clinical Impressions(s) / UC Diagnoses   Final diagnoses:  Acute  pain of right shoulder     Discharge Instructions     Rest, ice area of concern. May take muscle relaxer as needed for severe tightness, pain. Can also take anti-inflammatory once daily for additional pain relief. Important to follow-up with Ortho should symptoms not improve over the next week. Return if you develop worsening pain, numbness, tingling of your arm, neck pain, back pain, or are unable to lift your arm.    ED Prescriptions    Medication Sig Dispense Auth. Provider   meloxicam (MOBIC) 7.5 MG tablet Take 1 tablet (7.5 mg total) by mouth daily. 14 tablet Hall-Potvin, [place], PA-C   cyclobenzaprine (FLEXERIL) 5 MG tablet Take 1 tablet (5 mg total) by mouth 2 (two) times daily as needed for muscle spasms. 14 tablet Hall-Potvin, [place], PA-C     Controlled Substance Prescriptions [place] Controlled Substance Registry consulted? Not Applicable   Shea EvansHall-Potvin, , [place]PA-C 01/17/19 [id number]

## 2019-01-17 NOTE — Discharge Instructions (Addendum)
Rest, ice area of concern. May take muscle relaxer as needed for severe tightness, pain. Can also take anti-inflammatory once daily for additional pain relief. Important to follow-up with Ortho should symptoms not improve over the next week. Return if you develop worsening pain, numbness, tingling of your arm, neck pain, back pain, or are unable to lift your arm.

## 2020-06-05 ENCOUNTER — Emergency Department (HOSPITAL_COMMUNITY)
Admission: EM | Admit: 2020-06-05 | Discharge: 2020-06-05 | Disposition: A | Discharge status: Home / Self Care | Payer: Self-pay | Attending: Emergency Medicine | Admitting: Emergency Medicine

## 2020-06-05 ENCOUNTER — Other Ambulatory Visit: Payer: Self-pay

## 2020-06-05 ENCOUNTER — Emergency Department (HOSPITAL_COMMUNITY): Payer: Self-pay

## 2020-06-05 ENCOUNTER — Encounter (HOSPITAL_COMMUNITY): Payer: Self-pay | Admitting: Emergency Medicine

## 2020-06-05 DIAGNOSIS — M546 Pain in thoracic spine: Secondary | ICD-10-CM | POA: Insufficient documentation

## 2020-06-05 DIAGNOSIS — R059 Cough, unspecified: Secondary | ICD-10-CM | POA: Insufficient documentation

## 2020-06-05 DIAGNOSIS — R079 Chest pain, unspecified: Secondary | ICD-10-CM | POA: Insufficient documentation

## 2020-06-05 DIAGNOSIS — Z5321 Procedure and treatment not carried out due to patient leaving prior to being seen by health care provider: Secondary | ICD-10-CM | POA: Insufficient documentation

## 2020-06-05 LAB — BASIC METABOLIC PANEL
Anion gap: 8 (ref 5–15)
BUN: 13 mg/dL (ref 6–20)
CO2: 25 mmol/L (ref 22–32)
Calcium: 9.3 mg/dL (ref 8.9–10.3)
Chloride: 107 mmol/L (ref 98–111)
Creatinine, Ser: 0.78 mg/dL (ref 0.44–1.00)
GFR, Estimated: >60 mL/min (ref >60)
Glucose, Bld: 110 mg/dL — ABNORMAL HIGH (ref 70–99)
Potassium: 3.5 mmol/L (ref 3.5–5.1)
Sodium: 140 mmol/L (ref 135–145)

## 2020-06-05 LAB — CBC
HCT: 37.8 % (ref 36.0–46.0)
Hemoglobin: 13.1 g/dL (ref 12.0–15.0)
MCH: 32.9 pg (ref 26.0–34.0)
MCHC: 34.7 g/dL (ref 30.0–36.0)
MCV: 95 fL (ref 80.0–100.0)
Platelets: 203 10*3/uL (ref 150–400)
RBC: 3.98 MIL/uL (ref 3.87–5.11)
RDW: 13.1 % (ref 11.5–15.5)
WBC: 5.6 10*3/uL (ref 4.0–10.5)
nRBC: 0 % (ref 0.0–0.2)

## 2020-06-05 LAB — TROPONIN I (HIGH SENSITIVITY): Troponin I (High Sensitivity): 3 ng/L (ref <18)

## 2020-06-05 NOTE — ED Notes (Signed)
Pt states she has to go pick up her grandchildren and will be back tonight or in the morning.

## 2020-06-05 NOTE — ED Triage Notes (Signed)
C/o R sided thoracic back pain that radiates to center of chest x 2 days with cough.

## 2020-06-06 ENCOUNTER — Encounter (HOSPITAL_COMMUNITY): Payer: Self-pay | Admitting: Emergency Medicine

## 2020-06-06 ENCOUNTER — Other Ambulatory Visit: Payer: Self-pay

## 2020-06-06 ENCOUNTER — Emergency Department (HOSPITAL_COMMUNITY)
Admission: EM | Admit: 2020-06-06 | Discharge: 2020-06-06 | Disposition: A | Discharge status: Home / Self Care | Payer: Self-pay | Attending: Emergency Medicine | Admitting: Emergency Medicine

## 2020-06-06 DIAGNOSIS — R0789 Other chest pain: Secondary | ICD-10-CM | POA: Insufficient documentation

## 2020-06-06 DIAGNOSIS — F17210 Nicotine dependence, cigarettes, uncomplicated: Secondary | ICD-10-CM | POA: Insufficient documentation

## 2020-06-06 DIAGNOSIS — Z7982 Long term (current) use of aspirin: Secondary | ICD-10-CM | POA: Insufficient documentation

## 2020-06-06 DIAGNOSIS — Z20822 Contact with and (suspected) exposure to covid-19: Secondary | ICD-10-CM | POA: Insufficient documentation

## 2020-06-06 DIAGNOSIS — Z720 Tobacco use: Secondary | ICD-10-CM

## 2020-06-06 LAB — RESP PANEL BY RT-PCR (FLU A&B, COVID) ARPGX2
Influenza A by PCR: NEGATIVE
Influenza B by PCR: NEGATIVE
SARS Coronavirus 2 by RT PCR: NEGATIVE

## 2020-06-06 MED ORDER — IBUPROFEN 600 MG PO TABS: 600.0000 mg | ORAL_TABLET | Freq: Four times a day (QID) | ORAL | 0 refills | Status: DC | PRN

## 2020-06-06 MED ORDER — KETOROLAC TROMETHAMINE 60 MG/2ML IM SOLN
60.0000 mg | Freq: Once | INTRAMUSCULAR | Status: AC
  Administered 2020-06-06: 60 mg via INTRAMUSCULAR
  Filled 2020-06-06: qty 2

## 2020-06-06 NOTE — ED Provider Notes (Signed)
[facility] EMERGENCY DEPARTMENT Provider Note   CSN: [id number] Arrival date & time: 06/06/20  1115     History Chief Complaint  Patient presents with  . Back Pain  . Cough    Diana Harris is a 54 y.o. female with a 20-pack-year smoking history who presents to the ED with atraumatic right side lateral chest wall discomfort, worse with coughing, laughing, sneezing, and other forms of movement.  Patient reports that she has been a half a pack per day smoker since she was 55 years old.  She does not have a primary care provider, although she does report that she has medical insurance through her employer at [employer].  She states for the past few days, she has been experiencing congestion, sneezing, and nonproductive cough.  She denies any fevers, chills, chest pain, abdominal pain, nausea or vomiting, urinary symptoms, or other symptoms.  She is not immunized for COVID-19.  She denies any obvious sick contacts.  She has not taken anything for her pain.  She is hoping for a work note.  HPI     Past Medical History:  Diagnosis Date  . Ischemic stroke ([facility]) 08/22/2017   Right posterior occipital ischemic stroke/notes 08/22/2017  . Tobacco abuse     Patient Active Problem List   Diagnosis Date Noted  . Ischemic stroke ([facility]) 08/22/2017  . Tobacco use disorder 08/22/2017    Past Surgical History:  Procedure Laterality Date  . CESAREAN SECTION    . CESAREAN SECTION  1985     OB History   No obstetric history on file.     Family History  Problem Relation Age of Onset  . Hypertension Mother   . Hypertension Father   . Lung cancer Sister   . Prostate cancer Brother     Social History   Tobacco Use  . Smoking status: Current Every Day Smoker    Packs/day: 1.00    Years: 40.00    Pack years: 40.00    Types: Cigarettes  . Smokeless tobacco: Never Used  . Tobacco comment: trying  Vaping Use  . Vaping Use: Never used  Substance Use Topics  .  Alcohol use: Yes    Alcohol/week: 6.0 standard drinks    Types: 6 Cans of beer per week  . Drug use: No    Home Medications Prior to Admission medications   Medication Sig Start Date End Date Taking? Authorizing Provider  aspirin 81 MG tablet Take 1 tablet (81 mg total) by mouth daily. 08/29/17   John Giovanni, MD  atorvastatin (LIPITOR) 40 MG tablet Take 1 tablet (40 mg total) by mouth daily at 6 PM. 08/29/17   John Giovanni, MD  cyclobenzaprine (FLEXERIL) 5 MG tablet Take 1 tablet (5 mg total) by mouth 2 (two) times daily as needed for muscle spasms. 01/17/19   Hall-Potvin, [place], PA-C  ibuprofen (ADVIL) 600 MG tablet Take 1 tablet (600 mg total) by mouth every 6 (six) hours as needed. 06/06/20   Lorelee New, PA-C  meloxicam (MOBIC) 7.5 MG tablet Take 1 tablet (7.5 mg total) by mouth daily. 01/17/19   Hall-Potvin, [place], PA-C  nicotine polacrilex (NICORETTE) 2 MG gum Chew 1 piece of gum every 1-2 hours while awake. Do not exceed 24 pieces of gum in a day. 08/29/17   John Giovanni, MD    Allergies    Patient has no known allergies.  Review of Systems   Review of Systems  All other systems reviewed and  are negative.   Physical Exam Updated Vital Signs BP 119/67 (BP Location: Left Arm)   Pulse 62   Temp 98.4 F (36.9 C) (Oral)   Resp 16   Ht 5' 2.5" (1.588 m)   Wt 61.2 kg   SpO2 98%   BMI 24.30 kg/m   Physical Exam Vitals and nursing note reviewed. Exam conducted with a chaperone present.  Constitutional:      Appearance: She is ill-appearing.  HENT:     Head: Normocephalic and atraumatic.     Nose: Congestion present.     Mouth/Throat:     Pharynx: Oropharynx is clear.  Eyes:     General: No scleral icterus.    Conjunctiva/sclera: Conjunctivae normal.  Cardiovascular:     Rate and Rhythm: Normal rate and regular rhythm.     Pulses: Normal pulses.     Heart sounds: Normal heart sounds.  Pulmonary:     Effort: Pulmonary effort is normal. No  respiratory distress.     Breath sounds: Normal breath sounds. No wheezing or rales.     Comments: CTA bilaterally.  No increased work of breathing. Musculoskeletal:     Cervical back: Normal range of motion. No rigidity.  Skin:    General: Skin is dry.  Neurological:     Mental Status: She is alert.     GCS: GCS eye subscore is 4. GCS verbal subscore is 5. GCS motor subscore is 6.  Psychiatric:        Mood and Affect: Mood normal.        Behavior: Behavior normal.        Thought Content: Thought content normal.     ED Results / Procedures / Treatments   Labs (all labs ordered are listed, but only abnormal results are displayed) Labs Reviewed  RESP PANEL BY RT-PCR (FLU A&B, COVID) [id number]    EKG None  Radiology DG Chest 2 View  Result Date: 06/05/2020 CLINICAL DATA:  Right thoracic pain radiating to center of the chest for 2 days with cough EXAM: CHEST - 2 VIEW COMPARISON:  None. FINDINGS: Mildly coarsened interstitial changes and hyperinflation of the lungs. Some more bandlike opacity in the lower lobes and lingula likely subsegmental atelectatic change or scarring. No consolidation, features of edema, pneumothorax, or effusion. The cardiomediastinal contours are unremarkable. No acute osseous or soft tissue abnormality. Degenerative changes are present in the imaged spine and shoulders. IMPRESSION: 1. Mild coarsened interstitial changes and hyperinflation. Correlate for smoking related changes. 2. More bandlike opacity in the lower lobes and lingula likely subsegmental atelectatic change or scarring. Electronically Signed   By: Kreg Shropshire M.D.   On: 06/05/2020 15:24    Procedures Procedures (including critical care time)  Medications Ordered in ED Medications  ketorolac (TORADOL) injection 60 mg (has no administration in time range)    ED Course  I have reviewed the triage vital signs and the nursing notes.  Pertinent labs & imaging results that were available during my  care of the patient were reviewed by me and considered in my medical decision making (see chart for details).    MDM Rules/Calculators/A&P                          Patient is only complaining of right-sided chest wall TTP.  No overlying skin changes.  Her symptoms are exacerbated by cough, sneezing, and other forms of movement.  Her history and physical exam is suggestive of costochondritis  versus musculoskeletal strain.  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  She is requesting a work note.  She does not take anything for her symptoms yet, but I feel as though she would benefit from NSAIDs.    Laboratory work-up obtained yesterday was entirely unremarkable.  No leukocytosis, anemia, elevated troponin, or impaired renal function.  Do not feel as though any laboratory work-up needs to be repeated today.  She denies any urinary symptoms and I have low suspicion for pyelonephritis or obstructing ureterolithiasis as cause of her symptoms.  She also denies any abdominal discomfort or emesis and there was no right upper quadrant abdominal tenderness or positive Murphy sign otherwise concerning for gallbladder etiology with radiation to right subscapular region.  No midline spinal TTP or overlying skin changes.  No history of IVDA.  No history of clots or clotting disorder.  She is PERC negative.    Plain films of chest obtained yesterday are also personally reviewed and demonstrate coarsened interstitial changes and hyperinflation, consistent with her history of tobacco use.  While there is a bandlike opacity in the lower lobes, likely atelectasis versus scarring.  She denies any fevers or chills and I have lower suspicion for community-acquired pneumonia.  However, given her congestion, rhinorrhea, and mild cough, will obtain COVID-19 testing.  She is oxygenating well on room air here in the ED and demonstrating no increased work of breathing.  Her vital  signs are stable and within normal limits.  We will treat with IM Toradol here in the ED and encouraged outpatient ibuprofen 600 mg every 6 hours as needed for chest wall discomfort.  We will provide her with a work note for today and tomorrow, pending results of her COVID-19 testing.  Strict ED return precautions discussed.  Patient voices understanding and is agreeable to the plan.  The patient was counseled on the dangers of tobacco use, and was advised to quit.  Reviewed strategies to maximize success, including removing cigarettes and smoking materials from environment, stress management, substitution of other forms of reinforcement, support of family/friends and written materials. Total time was 5 min CPT code 37169.    Final Clinical Impression(s) / ED Diagnoses Final diagnoses:  Right-sided chest wall pain  Tobacco use    Rx / DC Orders ED Discharge Orders         Ordered    ibuprofen (ADVIL) 600 MG tablet  Every 6 hours PRN        06/06/20 1401           Elvera Maria 06/06/20 1404    Cheryll Cockayne, MD 06/11/20 [phone]

## 2020-06-06 NOTE — Discharge Instructions (Addendum)
Your history and physical exam is consistent with right-sided chest wall pain, along the ribs.  This is likely a pulled muscle in the context of your recent coughing and sneezing.  You have been treated with Toradol here in the ED which will help with your inflammation and pain.  I have also prescribed you 600 mg ibuprofen which I would like you to take every 6 hours.  Do not combine with Goody powders or other NSAIDs.  You have also been tested for COVID-19.  You will receive a phone call if there is a positive finding.  Please also check your MyChart app.  Return to the ED or seek immediate medical attention should you experience any new or worsening symptoms.

## 2020-06-06 NOTE — ED Triage Notes (Signed)
Pt seen in ED yesterday for R sided thoracic back pain that radiates to center of chest with cough.  LWBS.  States she feels the same as yesterday.  Symptoms now for 3 days.

## 2020-06-06 NOTE — ED Notes (Signed)
Patient verbalizes understanding of discharge instructions. Opportunity for questioning and answers were provided. Armband removed by staff, pt discharged from ED and ambulated to lobby to return home.   

## 2021-02-03 ENCOUNTER — Emergency Department (HOSPITAL_COMMUNITY): Payer: BC Managed Care – PPO

## 2021-02-03 ENCOUNTER — Emergency Department (HOSPITAL_COMMUNITY)
Admission: EM | Admit: 2021-02-03 | Discharge: 2021-02-03 | Discharge status: Against Medical Advice | Payer: BC Managed Care – PPO | Attending: Emergency Medicine | Admitting: Emergency Medicine

## 2021-02-03 ENCOUNTER — Encounter (HOSPITAL_COMMUNITY): Payer: Self-pay | Admitting: Emergency Medicine

## 2021-02-03 DIAGNOSIS — R531 Weakness: Secondary | ICD-10-CM | POA: Diagnosis not present

## 2021-02-03 DIAGNOSIS — F17210 Nicotine dependence, cigarettes, uncomplicated: Secondary | ICD-10-CM | POA: Insufficient documentation

## 2021-02-03 DIAGNOSIS — R29898 Other symptoms and signs involving the musculoskeletal system: Secondary | ICD-10-CM | POA: Diagnosis not present

## 2021-02-03 DIAGNOSIS — I639 Cerebral infarction, unspecified: Secondary | ICD-10-CM | POA: Insufficient documentation

## 2021-02-03 DIAGNOSIS — R202 Paresthesia of skin: Secondary | ICD-10-CM | POA: Diagnosis not present

## 2021-02-03 DIAGNOSIS — R471 Dysarthria and anarthria: Secondary | ICD-10-CM | POA: Insufficient documentation

## 2021-02-03 DIAGNOSIS — R4701 Aphasia: Secondary | ICD-10-CM | POA: Diagnosis not present

## 2021-02-03 DIAGNOSIS — R4781 Slurred speech: Secondary | ICD-10-CM | POA: Diagnosis not present

## 2021-02-03 DIAGNOSIS — I10 Essential (primary) hypertension: Secondary | ICD-10-CM | POA: Diagnosis not present

## 2021-02-03 DIAGNOSIS — Z7982 Long term (current) use of aspirin: Secondary | ICD-10-CM | POA: Insufficient documentation

## 2021-02-03 LAB — CBC
HCT: 40 % (ref 36.0–46.0)
Hemoglobin: 14.4 g/dL (ref 12.0–15.0)
MCH: 33.6 pg (ref 26.0–34.0)
MCHC: 36 g/dL (ref 30.0–36.0)
MCV: 93.2 fL (ref 80.0–100.0)
Platelets: 218 10*3/uL (ref 150–400)
RBC: 4.29 MIL/uL (ref 3.87–5.11)
RDW: 13.2 % (ref 11.5–15.5)
WBC: 6.2 10*3/uL (ref 4.0–10.5)
nRBC: 0 % (ref 0.0–0.2)

## 2021-02-03 LAB — COMPREHENSIVE METABOLIC PANEL
ALT: 95 U/L — ABNORMAL HIGH (ref 0–44)
AST: 71 U/L — ABNORMAL HIGH (ref 15–41)
Albumin: 3.5 g/dL (ref 3.5–5.0)
Alkaline Phosphatase: 66 U/L (ref 38–126)
Anion gap: 9 (ref 5–15)
BUN: 11 mg/dL (ref 6–20)
CO2: 24 mmol/L (ref 22–32)
Calcium: 8.8 mg/dL — ABNORMAL LOW (ref 8.9–10.3)
Chloride: 105 mmol/L (ref 98–111)
Creatinine, Ser: 0.88 mg/dL (ref 0.44–1.00)
GFR, Estimated: >60 mL/min (ref >60)
Glucose, Bld: 116 mg/dL — ABNORMAL HIGH (ref 70–99)
Potassium: 3.6 mmol/L (ref 3.5–5.1)
Sodium: 138 mmol/L (ref 135–145)
Total Bilirubin: 0.6 mg/dL (ref 0.3–1.2)
Total Protein: 6.8 g/dL (ref 6.5–8.1)

## 2021-02-03 LAB — PROTIME-INR
INR: 1 (ref 0.8–1.2)
Prothrombin Time: 13.5 seconds (ref 11.4–15.2)

## 2021-02-03 LAB — I-STAT CHEM 8, ED
BUN: 13 mg/dL (ref 6–20)
Calcium, Ion: 1.14 mmol/L — ABNORMAL LOW (ref 1.15–1.40)
Chloride: 106 mmol/L (ref 98–111)
Creatinine, Ser: 0.8 mg/dL (ref 0.44–1.00)
Glucose, Bld: 118 mg/dL — ABNORMAL HIGH (ref 70–99)
HCT: 42 % (ref 36.0–46.0)
Hemoglobin: 14.3 g/dL (ref 12.0–15.0)
Potassium: 3.6 mmol/L (ref 3.5–5.1)
Sodium: 140 mmol/L (ref 135–145)
TCO2: 24 mmol/L (ref 22–32)

## 2021-02-03 LAB — APTT: aPTT: 31 seconds (ref 24–36)

## 2021-02-03 LAB — DIFFERENTIAL
Abs Immature Granulocytes: 0.02 10*3/uL (ref 0.00–0.07)
Basophils Absolute: 0 10*3/uL (ref 0.0–0.1)
Basophils Relative: 0 %
Eosinophils Absolute: 0.1 10*3/uL (ref 0.0–0.5)
Eosinophils Relative: 2 %
Immature Granulocytes: 0 %
Lymphocytes Relative: 33 %
Lymphs Abs: 2.1 10*3/uL (ref 0.7–4.0)
Monocytes Absolute: 0.5 10*3/uL (ref 0.1–1.0)
Monocytes Relative: 8 %
Neutro Abs: 3.5 10*3/uL (ref 1.7–7.7)
Neutrophils Relative %: 57 %

## 2021-02-03 MED ORDER — CLOPIDOGREL BISULFATE 75 MG PO TABS: 75.0000 mg | ORAL_TABLET | Freq: Every day | ORAL | 0 refills | Status: DC

## 2021-02-03 MED ORDER — SODIUM CHLORIDE 0.9% FLUSH: 3.0000 mL | Freq: Once | INTRAVENOUS | Status: DC

## 2021-02-03 MED ORDER — ASPIRIN 81 MG PO TABS: 81.0000 mg | ORAL_TABLET | Freq: Every day | ORAL | 0 refills | Status: DC

## 2021-02-03 MED ORDER — ASPIRIN EC 81 MG PO TBEC
81.0000 mg | DELAYED_RELEASE_TABLET | Freq: Every day | ORAL | Status: DC
  Administered 2021-02-03: 81 mg via ORAL
  Filled 2021-02-03: qty 1

## 2021-02-03 MED ORDER — CLOPIDOGREL BISULFATE 75 MG PO TABS
75.0000 mg | ORAL_TABLET | Freq: Once | ORAL | Status: AC
  Administered 2021-02-03: 75 mg via ORAL
  Filled 2021-02-03: qty 1

## 2021-02-03 NOTE — ED Notes (Signed)
Patient left AMA after receiving her PO medications.

## 2021-02-03 NOTE — ED Notes (Signed)
To MRI

## 2021-02-03 NOTE — ED Triage Notes (Signed)
Patient here for dysarthria and left hand numbness that started two days ago. No arm drift, reports decreased sensation on left arm and face, left leg feels the same as right, grip strength equal bilaterally, no facial droop, no vision changes, reports mild intermittent headache. Patient is not anticoagulated.

## 2021-02-03 NOTE — ED Provider Notes (Addendum)
[facility] EMERGENCY DEPARTMENT Provider Note   CSN: [id number] Arrival date & time: 02/03/21  1331     History Chief Complaint  Patient presents with   Aphasia    Diana Harris is a 55 y.o. female.  HPI     55 y/o with hx of R sided ischemic stroke comes in with cc of L sided upper extremity numbness and slurred speech. Last normal 2 days ago. Symptoms intensity is waxing and waning.  Family at the bedside, they report that at times they have had hard time understanding the patient when she speaks.  She has had prior strokes, but had recovered fully.  She continues to smoke about half a pack a day.  Past Medical History:  Diagnosis Date   Ischemic stroke (HCC) 08/22/2017   Right posterior occipital ischemic stroke/notes 08/22/2017   Tobacco abuse     Patient Active Problem List   Diagnosis Date Noted   Ischemic stroke (HCC) 08/22/2017   Tobacco use disorder 08/22/2017    Past Surgical History:  Procedure Laterality Date   CESAREAN SECTION     CESAREAN SECTION  1985     OB History   No obstetric history on file.     Family History  Problem Relation Age of Onset   Hypertension Mother    Hypertension Father    Lung cancer Sister    Prostate cancer Brother     Social History   Tobacco Use   Smoking status: Every Day    Packs/day: 1.00    Years: 40.00    Pack years: 40.00    Types: Cigarettes   Smokeless tobacco: Never   Tobacco comments:    trying  Vaping Use   Vaping Use: Never used  Substance Use Topics   Alcohol use: Yes    Alcohol/week: 6.0 standard drinks    Types: 6 Cans of beer per week   Drug use: No    Home Medications Prior to Admission medications   Medication Sig Start Date End Date Taking? Authorizing Provider  aspirin 81 MG tablet Take 1 tablet (81 mg total) by mouth daily. 08/29/17   John Giovanni, MD  atorvastatin (LIPITOR) 40 MG tablet Take 1 tablet (40 mg total) by mouth daily at 6 PM. 08/29/17    John Giovanni, MD  cyclobenzaprine (FLEXERIL) 5 MG tablet Take 1 tablet (5 mg total) by mouth 2 (two) times daily as needed for muscle spasms. 01/17/19   Hall-Potvin, [place], PA-C  ibuprofen (ADVIL) 600 MG tablet Take 1 tablet (600 mg total) by mouth every 6 (six) hours as needed. 06/06/20   Lorelee New, PA-C  meloxicam (MOBIC) 7.5 MG tablet Take 1 tablet (7.5 mg total) by mouth daily. 01/17/19   Hall-Potvin, [place], PA-C  nicotine polacrilex (NICORETTE) 2 MG gum Chew 1 piece of gum every 1-2 hours while awake. Do not exceed 24 pieces of gum in a day. 08/29/17   John Giovanni, MD    Allergies    Patient has no known allergies.  Review of Systems   Review of Systems  Constitutional:  Positive for activity change.  Respiratory:  Negative for shortness of breath.   Cardiovascular:  Negative for chest pain.  Neurological:  Positive for speech difficulty and numbness.  All other systems reviewed and are negative.  Physical Exam Updated Vital Signs BP (!) 169/91   Pulse 66   Temp 98 F (36.7 C)   Resp 20   SpO2 99%   Physical  Exam Vitals and nursing note reviewed.  Constitutional:      Appearance: She is well-developed.  HENT:     Head: Atraumatic.  Cardiovascular:     Rate and Rhythm: Normal rate.  Pulmonary:     Effort: Pulmonary effort is normal.  Musculoskeletal:     Cervical back: Normal range of motion and neck supple.  Skin:    General: Skin is warm and dry.  Neurological:     Mental Status: She is alert and oriented to person, place, and time.     Cranial Nerves: No cranial nerve deficit.     Sensory: No sensory deficit.     Motor: No weakness.     Coordination: Coordination normal.     Comments: Slight dysarthria    ED Results / Procedures / Treatments   Labs (all labs ordered are listed, but only abnormal results are displayed) Labs Reviewed  COMPREHENSIVE METABOLIC PANEL - Abnormal; Notable for the following components:      Result Value    Glucose, Bld 116 (*)    Calcium 8.8 (*)    AST 71 (*)    ALT 95 (*)    All other components within normal limits  I-STAT CHEM 8, ED - Abnormal; Notable for the following components:   Glucose, Bld 118 (*)    Calcium, Ion 1.14 (*)    All other components within normal limits  SARS CORONAVIRUS 2 (TAT 6-24 HRS)  PROTIME-INR  APTT  CBC  DIFFERENTIAL  CBG MONITORING, ED    EKG EKG Interpretation  Date/Time:  Thursday February 03 2021 13:42:51 EDT Ventricular Rate:  81 PR Interval:  146 QRS Duration: 70 QT Interval:  390 QTC Calculation: 453 R Axis:   24 Text Interpretation: Normal sinus rhythm Normal ECG No acute changes No significant change since last tracing Confirmed by Derwood Kaplan [phone]) on 02/03/2021 5:31:29 PM  Radiology CT HEAD WO CONTRAST  Result Date: 02/03/2021 CLINICAL DATA:  Neuro deficit, acute, stroke suspected; dysarthria and left hand numbness EXAM: CT HEAD WITHOUT CONTRAST TECHNIQUE: Contiguous axial images were obtained from the base of the skull through the vertex without intravenous contrast. COMPARISON:  None. FINDINGS: Brain: There is no acute intracranial hemorrhage. Small area of hypoattenuation with loss of gray-white differentiation is present in the right frontal lobe likely involving the precentral gyrus. Small chronic infarct of the parasagittal right occipital lobe with volume loss. No significant mass effect. No hydrocephalus or extra-axial collection. Vascular: No hyperdense vessel. Skull: Calvarium is unremarkable. Sinuses/Orbits: No acute finding. Other: None. IMPRESSION: Acute or subacute small right frontal infarction likely involving the precentral gyrus. No acute intracranial hemorrhage. Small chronic parasagittal right occipital infarct. These results were called by telephone at the time of interpretation on 02/03/2021 at 3:40 pm to provider [facility] , who verbally acknowledged these results. Electronically Signed   By: Guadlupe Spanish M.D.   On:  02/03/2021 15:40   MR BRAIN WO CONTRAST  Result Date: 02/03/2021 CLINICAL DATA:  TIA, slurring words and left-sided weakness EXAM: MRI HEAD WITHOUT CONTRAST TECHNIQUE: Multiplanar, multiecho pulse sequences of the brain and surrounding structures were obtained without intravenous contrast. COMPARISON:  MRI brain 2019, correlation made with CT earlier same day FINDINGS: Brain: There are foci of diffusion hyperintensity with mixed EDC involving the right precentral gyrus and adjacent centrum semiovale. Chronic left pontine infarct. Chronic parasagittal right occipital infarct. Additional few small foci of T2 hyperintensity in the supratentorial white matter nonspecific but may reflect minor chronic microvascular  ischemic changes. Focus of susceptibility in the parasagittal left parietal subcortical white matter is most compatible with chronic microhemorrhage. No mass effect. No hydrocephalus or extra-axial collection. Ventricles are normal in size and configuration. Vascular: Major vessel flow voids at the skull base are preserved. Skull and upper cervical spine: Normal marrow signal is preserved. Sinuses/Orbits: Paranasal sinuses are aerated. Orbits are unremarkable. Other: Sella is unremarkable.  Mastoid air cells are clear. IMPRESSION: Areas of acute, subacute, and chronic infarction along the right precentral gyrus and adjacent centrum semiovale. Chronic left pontine and right occipital infarcts. Electronically Signed   By: Guadlupe Spanish M.D.   On: 02/03/2021 17:44    Procedures Procedures   Medications Ordered in ED Medications  sodium chloride flush (NS) 0.9 % injection 3 mL (has no administration in time range)    ED Course  I have reviewed the triage vital signs and the nursing notes.  Pertinent labs & imaging results that were available during my care of the patient were reviewed by me and considered in my medical decision making (see chart for details).    MDM Rules/Calculators/A&P                             55 year old comes in a chief complaint of slurred speech and left upper extremity numbness.  History of strokes, continues to smoke half a pack a day. She does have mild dysarthria on our exam.  Sensory exam was normal.  Clinical suspicion is high for subacute stroke.  CT scan of the brain does show right-sided frontal stroke.  We will consult neurology and admit the patient to the hospital service.  6:16 PM Spoke with neuro. Recommended CT-A head and neck. When neuro went to see the patient, she requests to be discharged.  Patient wants to leave against medical advice. Patient understands that her actions will lead to inadequate medical workup, and that she is at risk of complications of missed diagnosis, which includes morbidity and mortality.  Alternative options discussed - waiting for CT-A, being admitted only overnight Opportunity to change mind given. Discussion witnessed by her friend, who is at the bedside. Patient is demonstrating good capacity to make decision. Patient understands that she needs to return to the ER immediately if her symptoms get worse.  Pt reports that she is anxious and nervous about admission and has some chores to take care of. She will return to the ER for admission.  Aspirin and plavix combo prescribed.    Final Clinical Impression(s) / ED Diagnoses Final diagnoses:  Dysarthria  Acute stroke due to ischemia [facility])    Rx / DC Orders ED Discharge Orders     None        Derwood Kaplan, MD 02/03/21 1705    Derwood Kaplan, MD 02/03/21 1750    Derwood Kaplan, MD 02/03/21 1818

## 2021-02-03 NOTE — Discharge Instructions (Addendum)
You had a stroke and have decided to leave (in place of admission), against medical advise. Take aspirin and plavix as prescribed.  Return to the ER if your symptoms get worse. If you decide not to come back for admission, then it is prudent that you see Neurologist in the clinic by calling the number provided.

## 2021-02-03 NOTE — ED Provider Notes (Cosign Needed)
Emergency Medicine Provider Triage Evaluation Note  Diana Harris , a 55 y.o. female  was evaluated in triage.  Pt complains of slurring of her words and left-sided weakness.  He states this started 2 days ago, it will come and go, currently has no complaints at this time, states she has had a stroke in the past and she is concerned that she is having another 1.  She had no focal deficit from the previous stroke.  She has a slight headache this time, she denies change in vision, paresthesias or weakness in the upper and/or lower extremities.  Denies recent head trauma, Anticoagulant..  Review of Systems  Positive: Headache, Negative: Paresthesias or weakness  Physical Exam  BP (!) 144/96   Pulse 88   Temp 98.3 F (36.8 C) (Oral)   Resp 14   SpO2 99%  Gen:   Awake, no distress   Resp:  Normal effort  MSK:   Moves extremities without difficulty  Other:  No facial asymmetry, no difficulty word finding, able to follow commands, no unilateral weakness, cranial nerves II through XII gross intact.  Medical Decision Making  Medically screening exam initiated at 2:00 PM.  Appropriate orders placed.  Valentino Saxon Bonawitz was informed that the remainder of the evaluation will be completed by another provider, this initial triage assessment does not replace that evaluation, and the importance of remaining in the ED until their evaluation is complete.  Possible TIAs patient will need further work-up in the emergency department.   Carroll Sage, PA-C 02/03/21 1406

## 2021-02-04 ENCOUNTER — Other Ambulatory Visit: Payer: Self-pay

## 2021-02-04 ENCOUNTER — Encounter (HOSPITAL_COMMUNITY): Payer: Self-pay | Admitting: Pharmacy Technician

## 2021-02-04 ENCOUNTER — Emergency Department (HOSPITAL_COMMUNITY): Payer: BC Managed Care – PPO

## 2021-02-04 ENCOUNTER — Observation Stay (HOSPITAL_BASED_OUTPATIENT_CLINIC_OR_DEPARTMENT_OTHER): Payer: BC Managed Care – PPO

## 2021-02-04 ENCOUNTER — Observation Stay (HOSPITAL_COMMUNITY)
Admission: EM | Admit: 2021-02-04 | Discharge: 2021-02-05 | Disposition: A | Discharge status: Home / Self Care | Payer: BC Managed Care – PPO | Attending: Family Medicine | Admitting: Family Medicine

## 2021-02-04 DIAGNOSIS — E876 Hypokalemia: Secondary | ICD-10-CM | POA: Insufficient documentation

## 2021-02-04 DIAGNOSIS — I6523 Occlusion and stenosis of bilateral carotid arteries: Secondary | ICD-10-CM | POA: Diagnosis not present

## 2021-02-04 DIAGNOSIS — I639 Cerebral infarction, unspecified: Secondary | ICD-10-CM | POA: Diagnosis not present

## 2021-02-04 DIAGNOSIS — K056 Periodontal disease, unspecified: Secondary | ICD-10-CM | POA: Diagnosis not present

## 2021-02-04 DIAGNOSIS — Z7982 Long term (current) use of aspirin: Secondary | ICD-10-CM | POA: Insufficient documentation

## 2021-02-04 DIAGNOSIS — Z20822 Contact with and (suspected) exposure to covid-19: Secondary | ICD-10-CM | POA: Insufficient documentation

## 2021-02-04 DIAGNOSIS — K089 Disorder of teeth and supporting structures, unspecified: Secondary | ICD-10-CM | POA: Diagnosis not present

## 2021-02-04 DIAGNOSIS — I6601 Occlusion and stenosis of right middle cerebral artery: Secondary | ICD-10-CM | POA: Diagnosis not present

## 2021-02-04 DIAGNOSIS — Z8673 Personal history of transient ischemic attack (TIA), and cerebral infarction without residual deficits: Secondary | ICD-10-CM | POA: Diagnosis not present

## 2021-02-04 DIAGNOSIS — F17210 Nicotine dependence, cigarettes, uncomplicated: Secondary | ICD-10-CM | POA: Insufficient documentation

## 2021-02-04 DIAGNOSIS — Z7902 Long term (current) use of antithrombotics/antiplatelets: Secondary | ICD-10-CM | POA: Insufficient documentation

## 2021-02-04 DIAGNOSIS — G9389 Other specified disorders of brain: Secondary | ICD-10-CM | POA: Diagnosis not present

## 2021-02-04 DIAGNOSIS — I6319 Cerebral infarction due to embolism of other precerebral artery: Secondary | ICD-10-CM | POA: Diagnosis not present

## 2021-02-04 DIAGNOSIS — I6389 Other cerebral infarction: Secondary | ICD-10-CM

## 2021-02-04 DIAGNOSIS — R9431 Abnormal electrocardiogram [ECG] [EKG]: Secondary | ICD-10-CM | POA: Diagnosis not present

## 2021-02-04 DIAGNOSIS — R200 Anesthesia of skin: Secondary | ICD-10-CM | POA: Diagnosis not present

## 2021-02-04 LAB — COMPREHENSIVE METABOLIC PANEL
ALT: 94 U/L — ABNORMAL HIGH (ref 0–44)
AST: 71 U/L — ABNORMAL HIGH (ref 15–41)
Albumin: 3.4 g/dL — ABNORMAL LOW (ref 3.5–5.0)
Alkaline Phosphatase: 63 U/L (ref 38–126)
Anion gap: 8 (ref 5–15)
BUN: 8 mg/dL (ref 6–20)
CO2: 24 mmol/L (ref 22–32)
Calcium: 9.1 mg/dL (ref 8.9–10.3)
Chloride: 105 mmol/L (ref 98–111)
Creatinine, Ser: 0.72 mg/dL (ref 0.44–1.00)
GFR, Estimated: >60 mL/min (ref >60)
Glucose, Bld: 109 mg/dL — ABNORMAL HIGH (ref 70–99)
Potassium: 3.3 mmol/L — ABNORMAL LOW (ref 3.5–5.1)
Sodium: 137 mmol/L (ref 135–145)
Total Bilirubin: 0.8 mg/dL (ref 0.3–1.2)
Total Protein: 6.7 g/dL (ref 6.5–8.1)

## 2021-02-04 LAB — DIFFERENTIAL
Abs Immature Granulocytes: 0.02 10*3/uL (ref 0.00–0.07)
Basophils Absolute: 0 10*3/uL (ref 0.0–0.1)
Basophils Relative: 1 %
Eosinophils Absolute: 0.1 10*3/uL (ref 0.0–0.5)
Eosinophils Relative: 2 %
Immature Granulocytes: 0 %
Lymphocytes Relative: 39 %
Lymphs Abs: 2.2 10*3/uL (ref 0.7–4.0)
Monocytes Absolute: 0.5 10*3/uL (ref 0.1–1.0)
Monocytes Relative: 9 %
Neutro Abs: 2.9 10*3/uL (ref 1.7–7.7)
Neutrophils Relative %: 49 %

## 2021-02-04 LAB — LIPID PANEL
Cholesterol: 160 mg/dL (ref 0–200)
HDL: 69 mg/dL (ref >40)
LDL Cholesterol: 74 mg/dL (ref 0–99)
Total CHOL/HDL Ratio: 2.3 RATIO
Triglycerides: 87 mg/dL (ref <150)
VLDL: 17 mg/dL (ref 0–40)

## 2021-02-04 LAB — CBC
HCT: 41.5 % (ref 36.0–46.0)
Hemoglobin: 15.1 g/dL — ABNORMAL HIGH (ref 12.0–15.0)
MCH: 33.3 pg (ref 26.0–34.0)
MCHC: 36.4 g/dL — ABNORMAL HIGH (ref 30.0–36.0)
MCV: 91.4 fL (ref 80.0–100.0)
Platelets: 217 10*3/uL (ref 150–400)
RBC: 4.54 MIL/uL (ref 3.87–5.11)
RDW: 13.1 % (ref 11.5–15.5)
WBC: 5.7 10*3/uL (ref 4.0–10.5)
nRBC: 0 % (ref 0.0–0.2)

## 2021-02-04 LAB — APTT: aPTT: 32 seconds (ref 24–36)

## 2021-02-04 LAB — ECHOCARDIOGRAM COMPLETE
Area-P 1/2: 3.26 cm2
S' Lateral: 1.9 cm

## 2021-02-04 LAB — I-STAT CHEM 8, ED
BUN: 8 mg/dL (ref 6–20)
Calcium, Ion: 1.12 mmol/L — ABNORMAL LOW (ref 1.15–1.40)
Chloride: 105 mmol/L (ref 98–111)
Creatinine, Ser: 0.6 mg/dL (ref 0.44–1.00)
Glucose, Bld: 111 mg/dL — ABNORMAL HIGH (ref 70–99)
HCT: 43 % (ref 36.0–46.0)
Hemoglobin: 14.6 g/dL (ref 12.0–15.0)
Potassium: 3.3 mmol/L — ABNORMAL LOW (ref 3.5–5.1)
Sodium: 140 mmol/L (ref 135–145)
TCO2: 25 mmol/L (ref 22–32)

## 2021-02-04 LAB — HEMOGLOBIN A1C
Hgb A1c MFr Bld: 4.7 % — ABNORMAL LOW (ref 4.8–5.6)
Mean Plasma Glucose: 88.19 mg/dL

## 2021-02-04 LAB — PROTIME-INR
INR: 1 (ref 0.8–1.2)
Prothrombin Time: 13.5 seconds (ref 11.4–15.2)

## 2021-02-04 LAB — RESP PANEL BY RT-PCR (FLU A&B, COVID) ARPGX2
Influenza A by PCR: NEGATIVE
Influenza B by PCR: NEGATIVE
SARS Coronavirus 2 by RT PCR: NEGATIVE

## 2021-02-04 LAB — CBG MONITORING, ED: Glucose-Capillary: 107 mg/dL — ABNORMAL HIGH (ref 70–99)

## 2021-02-04 MED ORDER — ENOXAPARIN SODIUM 40 MG/0.4ML IJ SOSY
40.0000 mg | PREFILLED_SYRINGE | INTRAMUSCULAR | Status: DC
  Administered 2021-02-04: 40 mg via SUBCUTANEOUS
  Filled 2021-02-04: qty 0.4

## 2021-02-04 MED ORDER — SODIUM CHLORIDE 0.9% FLUSH: 3.0000 mL | Freq: Once | INTRAVENOUS | Status: DC

## 2021-02-04 MED ORDER — ATORVASTATIN CALCIUM 80 MG PO TABS
80.0000 mg | ORAL_TABLET | Freq: Every day | ORAL | Status: DC
  Administered 2021-02-04 – 2021-02-05 (×2): 80 mg via ORAL
  Filled 2021-02-04 (×2): qty 1

## 2021-02-04 MED ORDER — IOHEXOL 350 MG/ML SOLN
80.0000 mL | Freq: Once | INTRAVENOUS | Status: AC | PRN
  Administered 2021-02-04: 80 mL via INTRAVENOUS

## 2021-02-04 MED ORDER — THIAMINE HCL 100 MG PO TABS
100.0000 mg | ORAL_TABLET | Freq: Every day | ORAL | Status: DC
  Administered 2021-02-04 – 2021-02-05 (×2): 100 mg via ORAL
  Filled 2021-02-04 (×2): qty 1

## 2021-02-04 MED ORDER — FOLIC ACID 1 MG PO TABS
1.0000 mg | ORAL_TABLET | Freq: Every day | ORAL | Status: DC
  Administered 2021-02-04 – 2021-02-05 (×2): 1 mg via ORAL
  Filled 2021-02-04 (×2): qty 1

## 2021-02-04 MED ORDER — POTASSIUM CHLORIDE CRYS ER 20 MEQ PO TBCR
40.0000 meq | EXTENDED_RELEASE_TABLET | Freq: Once | ORAL | Status: AC
  Administered 2021-02-04: 40 meq via ORAL
  Filled 2021-02-04: qty 2

## 2021-02-04 MED ORDER — ASPIRIN 81 MG PO CHEW
81.0000 mg | CHEWABLE_TABLET | Freq: Every day | ORAL | Status: DC
  Administered 2021-02-04 – 2021-02-05 (×2): 81 mg via ORAL
  Filled 2021-02-04 (×2): qty 1

## 2021-02-04 MED ORDER — CLOPIDOGREL BISULFATE 75 MG PO TABS
75.0000 mg | ORAL_TABLET | Freq: Every day | ORAL | Status: DC
  Administered 2021-02-04 – 2021-02-05 (×2): 75 mg via ORAL
  Filled 2021-02-04 (×2): qty 1

## 2021-02-04 MED ORDER — ADULT MULTIVITAMIN W/MINERALS CH
1.0000 | ORAL_TABLET | Freq: Every day | ORAL | Status: DC
  Administered 2021-02-04 – 2021-02-05 (×2): 1 via ORAL
  Filled 2021-02-04 (×2): qty 1

## 2021-02-04 NOTE — ED Notes (Signed)
Pt given food and beverage per Dr. Nobie Putnam

## 2021-02-04 NOTE — Consult Note (Signed)
Requesting Physician: Dr. Rhunette Croft     Chief Complaint: Slurred speech and left arm numbness   History obtained from:  Patient   HPI:                                                                                                                                         Diana Harris is an 55 y.o. female w/no known pmh who presents with slurred speech, left arm numbness and weakness. Her symptoms began three days ago. She denies having a history of hypertension, diabetes, hyperlipidemia, obstructive sleep apnea. Did have a prior stroke in February of 2019.   She is a smoker and smokes half a pack of cigarettes a day.   Date last known well: 02/01/21, unknown time  tPA Given: No given she was outside the time window  Modified Rankin: Rankin Score=0  Past Medical History:  Diagnosis Date   Ischemic stroke (HCC) 08/22/2017   Right posterior occipital ischemic stroke/notes 08/22/2017   Tobacco abuse     Past Surgical History:  Procedure Laterality Date   CESAREAN SECTION     CESAREAN SECTION  1985    Family History  Problem Relation Age of Onset   Hypertension Mother    Hypertension Father    Lung cancer Sister    Prostate cancer Brother        Social History:  reports that she has been smoking cigarettes. She has a 40.00 pack-year smoking history. She has never used smokeless tobacco. She reports current alcohol use of about 6.0 standard drinks of alcohol per week. She reports that she does not use drugs.  Allergies: No Known Allergies  Medications:                                                                                                                           Per speaking with the patient she does not take any medications at home, including aspirin for secondary stroke prevention   ROS:  History obtained from the patient  General ROS:  negative for - chills, fatigue, fever, night sweats, weight gain or weight loss Psychological ROS: negative for - , hallucinations, memory difficulties, mood swings or  Ophthalmic ROS: negative for - blurry vision, double vision, eye pain or loss of vision ENT ROS: negative for - epistaxis, nasal discharge, oral lesions, sore throat, tinnitus or vertigo Respiratory ROS: negative for - cough,  shortness of breath or wheezing Cardiovascular ROS: negative for - chest pain, dyspnea on exertion,  Gastrointestinal ROS: negative for - abdominal pain, diarrhea,  nausea/vomiting or stool incontinence Genito-Urinary ROS: negative for - dysuria, hematuria, incontinence or urinary frequency/urgency Musculoskeletal ROS: negative for - joint swelling or muscular weakness Neurological ROS: as noted in HPI   Physical Examination:  Constitutional: Pleasant African American women sitting up in bed  Respiratory: Unlabored respirations  Cardiac: RRR   Neurological MS: AAOx4 Speech: Fluent with repetition and naming intact. No dysarthria  CN: EOMI, VFF, Bilateral ptosis R> L, Face symmetric, Tongue midline, Shoulder shrug intact  Strength: Normal bulk and tone. Left pronator drift. Strength 5/5 throughout  Sensation: Intact to light touch throughout. Objectively she states that the numbness to her left arm comes and goes  Coordination: FNF + HTS intact  Gait: Deferred  NIHSS 1 for drift   Pertinent Imaging and Labs:   02/03/21 CT Head Acute or subacute small right frontal infarction likely involving the precentral gyrus.   No acute intracranial hemorrhage. Small chronic parasagittal right occipital infarct.  02/03/21 MRI Brain WO Contrast  Areas of acute, subacute, and chronic infarction along the right precentral gyrus and adjacent centrum semiovale. Chronic left pontine and right occipital infarcts.  Assessment and Plan:  55 year old female w/pmh of prior stroke in 2018, tobacco smoker who presents  with left sided numbness, weakness and dysarthria that began 3 days ago.   Her neurological exam is pertinent for a left pronator drift, subjective numbness that comes and goes to the left arm and bilateral ptosis.   MRI Brain revealed for a right pre central gyrus stroke.   Will proceed with stroke work up at this time. Also will initiate work up for myasthenia gravis given bilateral ptosis.   Recommendations as follows:  - HgbA1c, fasting lipid panel ordered  - PT consult, OT consult, Speech consult - Echocardiogram - CTA Head and Neck  - 80 mg of Atorvastatin ( dose can be reduced or discontinued depending on LDL)  - No need for permissive HTN given her sx started 3 days ago  - Stroke treatment: DAPT for 21 days followed by Aspirin monotherapy  - Telemetry monitoring - Neuro checks Q4  - NPO until passes stroke swallow screen  - MG work up: MUSK, AChR, LRP4 Antibody ordered for MG evaluation given ptosis   Please contact via amion with any questions  or reach out via chat.   Loren Racer  [facility] Patient discussed with attending physician Dr. Derry Lory  02/04/2021, 7:48 AM

## 2021-02-04 NOTE — ED Triage Notes (Addendum)
Here with slurred speech and L arm numbness X3 days. Pt with hx stroke. Alert and oriented X4. Pt seen yesterday for the same and left AMA.

## 2021-02-04 NOTE — ED Notes (Signed)
PT at bedside.

## 2021-02-04 NOTE — Progress Notes (Signed)
Echocardiogram 2D Echocardiogram has been performed.   Warren Lacy Doryan Bahl RDCS 02/04/2021, 3:22 PM

## 2021-02-04 NOTE — H&P (Addendum)
Family Medicine Teaching [facility] Admission History and Physical Service Pager: [phone]  Patient name: Diana Harris Medical record number: [id number] Date of birth: April 06, 1966 Age: 55 y.o. Gender: female  Primary Care Provider: Patient, No Pcp Per (Inactive) Consultants: Neuro Code Status: Full Code   Preferred Emergency Contact: Melanye Hiraldo [phone]  Chief Complaint: Slurred speech  Assessment and Plan: Diana Harris is a 55 y.o. female presenting with CVA. PMH is significant for CVA (2019), tobacco abuse, alcohol abuse.  Slurred speech 2/2 CVA Patient presents with 3-day history of slurred speech and tingling in left upper extremity.  Was evaluated in the emergency department and found to have areas of acute, subacute, and chronic infarction along the right precentral gyrus and adjacent centrum semiovale.  There were also chronic left pontine and right occipital infarcts noted.  The patient left the emergency department on 8/4 AGAINST MEDICAL ADVICE but returned today for admission to finish stroke work-up.  Reports that the symptoms have not worsened since discharge.  Vital signs have been stable since arrival.  Unclear etiology.  Neurology was consulted who provided recommendations.  Lab work significant for hemoglobin A1c of 4.7, AST/ALT 71/94, potassium 3.3. -Admit to observation with Dr. Phebe Colla attending - Neurology consulted, appreciate recommendations - Bedside swallow completed by ED nursing staff who report she passed without any difficulty  - Neurochecks every 4 hours - Telemetry monitoring - DAPT for 21 days followed by aspirin monotherapy - No need for permissive hypertension given that the symptoms onset 3 days ago - Atorvastatin 80 mg was initiated prior to lipid panel results.  Will most likely decrease dose given LDL results - Echocardiogram ordered - PT/OT eval and treat  Tobacco dependence Patient reports she has been smoking since she was 55  years old.  She smokes half a pack cigarettes a day.  She is not interested in a nicotine patch at this time. - As needed nicotine patch  Alcohol abuse Patient reports that she drinks approximately 6 pack of beer per night and she has been for many years.  Her last drink was last night (8/4).  She reports that she has never withdrawn and does not need any alcohol at this time.  LFTs mildly elevated at AST/ALT 71/94. - CIWA protocol - Please call to request medications if CIWA's are elevated -Morning CMP  Hypokalemia Potassium on admission 3.3.  Given 40 mEq of K. -Morning CMP - Replete as needed  FEN/GI: Regular diet Prophylaxis: Lovenox  Disposition: Admit to inpatient teaching service with Dr. Phebe Colla attending  History of Present Illness:  Diana Harris is a 55 y.o. female presenting with slurred speech and tingling in left upper extremity.  She reports that she first noticed this 2-3 days ago.  She reports that she came to the emergency department yesterday but had something to do at home and so could not be admitted last night.  She returned this morning for admission so that she can finish the stroke work-up.  Reports that she has experienced this in the past and it was when she had a stroke in 2018 or 2019.  She denies that she is taking any medications at this time.  She smokes half a pack of cigarettes a day and has been smoking for 43 years.  She drinks a sixpack of beer nightly with her last drink being last night.  Review Of Systems: Per HPI with the following additions:   Review of Systems  Constitutional:  Negative for chills  and fever.  HENT:  Negative for congestion, hearing loss and rhinorrhea.   Eyes:  Negative for visual disturbance.  Respiratory:  Negative for cough and shortness of breath.   Cardiovascular:  Negative for chest pain.  Gastrointestinal:  Negative for abdominal pain, constipation, diarrhea, nausea and vomiting.  Genitourinary:  Negative for  difficulty urinating, dysuria and urgency.  Musculoskeletal:  Negative for arthralgias and neck pain.  Neurological:  Positive for numbness (left upper extremity).  Hematological:  Negative for adenopathy.  Psychiatric/Behavioral:  Negative for behavioral problems and confusion.     Patient Active Problem List   Diagnosis Date Noted   Ischemic stroke (HCC) 08/22/2017   Tobacco use disorder 08/22/2017    Past Medical History: Past Medical History:  Diagnosis Date   Ischemic stroke (HCC) 08/22/2017   Right posterior occipital ischemic stroke/notes 08/22/2017   Tobacco abuse     Past Surgical History: Past Surgical History:  Procedure Laterality Date   CESAREAN SECTION     CESAREAN SECTION  1985    Social History: Social History   Tobacco Use   Smoking status: Every Day    Packs/day: 1.00    Years: 40.00    Pack years: 40.00    Types: Cigarettes   Smokeless tobacco: Never   Tobacco comments:    trying  Vaping Use   Vaping Use: Never used  Substance Use Topics   Alcohol use: Yes    Alcohol/week: 6.0 standard drinks    Types: 6 Cans of beer per week   Drug use: No   Additional social history:   Please also refer to relevant sections of EMR.  Family History: Family History  Problem Relation Age of Onset   Hypertension Mother    Hypertension Father    Lung cancer Sister    Prostate cancer Brother     Allergies and Medications: No Known Allergies No current facility-administered medications on file prior to encounter.   Current Outpatient Medications on File Prior to Encounter  Medication Sig Dispense Refill   aspirin 81 MG tablet Take 1 tablet (81 mg total) by mouth daily. 90 tablet 0   atorvastatin (LIPITOR) 40 MG tablet Take 1 tablet (40 mg total) by mouth daily at 6 PM. 90 tablet 0   clopidogrel (PLAVIX) 75 MG tablet Take 1 tablet (75 mg total) by mouth daily. 21 tablet 0   cyclobenzaprine (FLEXERIL) 5 MG tablet Take 1 tablet (5 mg total) by mouth 2  (two) times daily as needed for muscle spasms. 14 tablet 0   ibuprofen (ADVIL) 600 MG tablet Take 1 tablet (600 mg total) by mouth every 6 (six) hours as needed. 30 tablet 0   meloxicam (MOBIC) 7.5 MG tablet Take 1 tablet (7.5 mg total) by mouth daily. 14 tablet 0   nicotine polacrilex (NICORETTE) 2 MG gum Chew 1 piece of gum every 1-2 hours while awake. Do not exceed 24 pieces of gum in a day. 100 tablet 1    Objective: BP 134/84   Pulse 72   Temp 98.2 F (36.8 C)   Resp (!) 21   SpO2 97%  Exam: General: Well-appearing 55 year old female in no acute distress Eyes: Bilateral proptosis, pupils are equal and reactive to light.  Noticed horizontal nystagmus when looking left as well as vertical nystagmus when looking to the left and up and down Neck: Supple, no cervical adenopathy noted Cardiovascular: Regular rate and rhythm, no murmurs appreciated Respiratory: Normal work of breathing, lungs clear to auscultation bilaterally Gastrointestinal:  Soft, nontender, positive bowel sounds, mildly distended MSK: No gross abnormalities, upper extremity strength is equal bilaterally with equal sensation Derm: No rashes appreciated Neuro: Cranial nerves intact, patient does have mild tongue deviation towards the right.  Left pronator drift noted.  Subjective numbness in the left arm which is not present at this time Psych: Normal mood, appropriate questions  Labs and Imaging: CBC BMET  Recent Labs  Lab 02/04/21 0717 02/04/21 0730  WBC 5.7  --   HGB 15.1* 14.6  HCT 41.5 43.0  PLT 217  --    Recent Labs  Lab 02/04/21 0717 02/04/21 0730  NA 137 140  K 3.3* 3.3*  CL 105 105  CO2 24  --   BUN 8 8  CREATININE 0.72 0.60  GLUCOSE 109* 111*  CALCIUM 9.1  --      Lipid Panel     Component Value Date/Time   CHOL 160 02/04/2021 0707   TRIG 87 02/04/2021 0707   HDL 69 02/04/2021 0707   CHOLHDL 2.3 02/04/2021 0707   VLDL 17 02/04/2021 0707   LDLCALC 74 02/04/2021 0707   HA1C 4.7 EKG:  Normal sinus rhythm   CT Angio Head W/Cm &/Or Wo Cm  Result Date: 02/04/2021 CLINICAL DATA:  Neuro deficit, stroke suspected EXAM: CT ANGIOGRAPHY HEAD AND NECK TECHNIQUE: Multidetector CT imaging of the head and neck was performed using the standard protocol during bolus administration of intravenous contrast. Multiplanar CT image reconstructions and MIPs were obtained to evaluate the vascular anatomy. Carotid stenosis measurements (when applicable) are obtained utilizing NASCET criteria, using the distal internal carotid diameter as the denominator. CONTRAST:  60mL OMNIPAQUE IOHEXOL 350 MG/ML SOLN COMPARISON:  Brain MRI and head CT obtained 1 day prior FINDINGS: CT HEAD FINDINGS Brain: There is hypodensity in the right precentral gyrus corresponding to the area of FLAIR signal abnormality seen on the prior MRI. Encephalomalacia in the right occipital lobe is consistent with remote infarct. There is no evidence of acute intracranial hemorrhage or extra-axial fluid collection. The ventricles are enlarged. There is no midline shift. Skull: Normal. Negative for fracture or focal lesion. Sinuses: Clear. Orbits: Unremarkable. Review of the MIP images confirms the above findings CTA NECK FINDINGS Aortic arch: There is mild calcification of the aortic arch. Right carotid system: There is a tiny outpouching arising from the proximal right external carotid artery (11-41). Otherwise, there is is no evidence of dissection, stenosis (50% or greater) or occlusion. Left carotid system: There is mild soft and calcified atherosclerotic plaque of the carotid bulb without hemodynamically significant stenosis or occlusion. There is no evidence of dissection. Vertebral arteries: Patent without evidence of stenosis, occlusion, or dissection. Skeleton: There is no acute osseous abnormality. Other neck: There is extensive dental disease. Upper chest: The lung apices are clear. Review of the MIP images confirms the above findings CTA  HEAD FINDINGS Anterior circulation: There is mild calcification of the bilateral cavernous ICAs without significant stenosis. There is focal stenosis of the inferior M2 division just after the origin (9-117). The distal right MCA branches are patent. The remainder of the anterior circulation is patent Posterior circulation:  Patent. Venous sinuses: Patent. Review of the MIP images confirms the above findings IMPRESSION: 1. Evolving subacute infarct in the right precentral gyrus. No new infarct or hemorrhage. 2. Focal stenosis at the origin of the right inferior M2 division. The distal right MCA branches are patent. 3. Mild calcified atherosclerotic plaque of the left carotid bulb and bilateral cavernous ICAs. 4.  Tiny outpouching arising from the proximal right external carotid artery may reflect ulcerated atherosclerotic plaque. Electronically Signed   By: Lesia Hausen MD   On: 02/04/2021 10:06   CT HEAD WO CONTRAST  Result Date: 02/03/2021 CLINICAL DATA:  Neuro deficit, acute, stroke suspected; dysarthria and left hand numbness EXAM: CT HEAD WITHOUT CONTRAST TECHNIQUE: Contiguous axial images were obtained from the base of the skull through the vertex without intravenous contrast. COMPARISON:  None. FINDINGS: Brain: There is no acute intracranial hemorrhage. Small area of hypoattenuation with loss of gray-white differentiation is present in the right frontal lobe likely involving the precentral gyrus. Small chronic infarct of the parasagittal right occipital lobe with volume loss. No significant mass effect. No hydrocephalus or extra-axial collection. Vascular: No hyperdense vessel. Skull: Calvarium is unremarkable. Sinuses/Orbits: No acute finding. Other: None. IMPRESSION: Acute or subacute small right frontal infarction likely involving the precentral gyrus. No acute intracranial hemorrhage. Small chronic parasagittal right occipital infarct. These results were called by telephone at the time of interpretation  on 02/03/2021 at 3:40 pm to provider [facility] , who verbally acknowledged these results. Electronically Signed   By: Guadlupe Spanish M.D.   On: 02/03/2021 15:40   CT Angio Neck W and/or Wo Contrast  Result Date: 02/04/2021 CLINICAL DATA:  Neuro deficit, stroke suspected EXAM: CT ANGIOGRAPHY HEAD AND NECK TECHNIQUE: Multidetector CT imaging of the head and neck was performed using the standard protocol during bolus administration of intravenous contrast. Multiplanar CT image reconstructions and MIPs were obtained to evaluate the vascular anatomy. Carotid stenosis measurements (when applicable) are obtained utilizing NASCET criteria, using the distal internal carotid diameter as the denominator. CONTRAST:  77mL OMNIPAQUE IOHEXOL 350 MG/ML SOLN COMPARISON:  Brain MRI and head CT obtained 1 day prior FINDINGS: CT HEAD FINDINGS Brain: There is hypodensity in the right precentral gyrus corresponding to the area of FLAIR signal abnormality seen on the prior MRI. Encephalomalacia in the right occipital lobe is consistent with remote infarct. There is no evidence of acute intracranial hemorrhage or extra-axial fluid collection. The ventricles are enlarged. There is no midline shift. Skull: Normal. Negative for fracture or focal lesion. Sinuses: Clear. Orbits: Unremarkable. Review of the MIP images confirms the above findings CTA NECK FINDINGS Aortic arch: There is mild calcification of the aortic arch. Right carotid system: There is a tiny outpouching arising from the proximal right external carotid artery (11-41). Otherwise, there is is no evidence of dissection, stenosis (50% or greater) or occlusion. Left carotid system: There is mild soft and calcified atherosclerotic plaque of the carotid bulb without hemodynamically significant stenosis or occlusion. There is no evidence of dissection. Vertebral arteries: Patent without evidence of stenosis, occlusion, or dissection. Skeleton: There is no acute osseous  abnormality. Other neck: There is extensive dental disease. Upper chest: The lung apices are clear. Review of the MIP images confirms the above findings CTA HEAD FINDINGS Anterior circulation: There is mild calcification of the bilateral cavernous ICAs without significant stenosis. There is focal stenosis of the inferior M2 division just after the origin (9-117). The distal right MCA branches are patent. The remainder of the anterior circulation is patent Posterior circulation:  Patent. Venous sinuses: Patent. Review of the MIP images confirms the above findings IMPRESSION: 1. Evolving subacute infarct in the right precentral gyrus. No new infarct or hemorrhage. 2. Focal stenosis at the origin of the right inferior M2 division. The distal right MCA branches are patent. 3. Mild calcified atherosclerotic plaque of the left carotid  bulb and bilateral cavernous ICAs. 4. Tiny outpouching arising from the proximal right external carotid artery may reflect ulcerated atherosclerotic plaque. Electronically Signed   By: Lesia HausenPeter  Noone MD   On: 02/04/2021 10:06   MR BRAIN WO CONTRAST  Result Date: 02/03/2021 CLINICAL DATA:  TIA, slurring words and left-sided weakness EXAM: MRI HEAD WITHOUT CONTRAST TECHNIQUE: Multiplanar, multiecho pulse sequences of the brain and surrounding structures were obtained without intravenous contrast. COMPARISON:  MRI brain 2019, correlation made with CT earlier same day FINDINGS: Brain: There are foci of diffusion hyperintensity with mixed EDC involving the right precentral gyrus and adjacent centrum semiovale. Chronic left pontine infarct. Chronic parasagittal right occipital infarct. Additional few small foci of T2 hyperintensity in the supratentorial white matter nonspecific but may reflect minor chronic microvascular ischemic changes. Focus of susceptibility in the parasagittal left parietal subcortical white matter is most compatible with chronic microhemorrhage. No mass effect. No  hydrocephalus or extra-axial collection. Ventricles are normal in size and configuration. Vascular: Major vessel flow voids at the skull base are preserved. Skull and upper cervical spine: Normal marrow signal is preserved. Sinuses/Orbits: Paranasal sinuses are aerated. Orbits are unremarkable. Other: Sella is unremarkable.  Mastoid air cells are clear. IMPRESSION: Areas of acute, subacute, and chronic infarction along the right precentral gyrus and adjacent centrum semiovale. Chronic left pontine and right occipital infarcts. Electronically Signed   By: Guadlupe SpanishPraneil  Patel M.D.   On: 02/03/2021 17:44    Derrel Nipresenzo, Victor, MD 02/04/2021, 10:19 AM PGY-3, [facility] FPTS Intern pager: [phone], text pages welcome

## 2021-02-04 NOTE — ED Provider Notes (Signed)
[facility] EMERGENCY DEPARTMENT Provider Note   CSN: [id number] Arrival date & time: 02/04/21  0710     History Chief Complaint  Patient presents with   Stroke Symptoms    Diana Harris is a 55 y.o. female.  Patient is a 55 year old female with a history of prior stroke in 2019 who presents with numbness to her left arm and slurred speech.  She says it started about 2 to 3 days ago.  She was seen here yesterday and had an MRI which did show acute and subacute infarcts.  She left AMA because she had a crockpot on and she did not want to burn the house down.  She is back here today and says that she wants to be admitted.  She denies any new symptoms.  She denies any involvement of her lower extremities.  No vision changes.  No facial numbness.      Past Medical History:  Diagnosis Date   Ischemic stroke (HCC) 08/22/2017   Right posterior occipital ischemic stroke/notes 08/22/2017   Tobacco abuse     Patient Active Problem List   Diagnosis Date Noted   Ischemic stroke (HCC) 08/22/2017   Tobacco use disorder 08/22/2017    Past Surgical History:  Procedure Laterality Date   CESAREAN SECTION     CESAREAN SECTION  1985     OB History   No obstetric history on file.     Family History  Problem Relation Age of Onset   Hypertension Mother    Hypertension Father    Lung cancer Sister    Prostate cancer Brother     Social History   Tobacco Use   Smoking status: Every Day    Packs/day: 1.00    Years: 40.00    Pack years: 40.00    Types: Cigarettes   Smokeless tobacco: Never   Tobacco comments:    trying  Vaping Use   Vaping Use: Never used  Substance Use Topics   Alcohol use: Yes    Alcohol/week: 6.0 standard drinks    Types: 6 Cans of beer per week   Drug use: No    Home Medications Prior to Admission medications   Medication Sig Start Date End Date Taking? Authorizing Provider  aspirin 81 MG tablet Take 1 tablet (81 mg total) by  mouth daily. 02/03/21   Derwood Kaplan, MD  atorvastatin (LIPITOR) 40 MG tablet Take 1 tablet (40 mg total) by mouth daily at 6 PM. 08/29/17   John Giovanni, MD  clopidogrel (PLAVIX) 75 MG tablet Take 1 tablet (75 mg total) by mouth daily. 02/03/21   Derwood Kaplan, MD  cyclobenzaprine (FLEXERIL) 5 MG tablet Take 1 tablet (5 mg total) by mouth 2 (two) times daily as needed for muscle spasms. 01/17/19   Hall-Potvin, [place], PA-C  ibuprofen (ADVIL) 600 MG tablet Take 1 tablet (600 mg total) by mouth every 6 (six) hours as needed. 06/06/20   Lorelee New, PA-C  meloxicam (MOBIC) 7.5 MG tablet Take 1 tablet (7.5 mg total) by mouth daily. 01/17/19   Hall-Potvin, [place], PA-C  nicotine polacrilex (NICORETTE) 2 MG gum Chew 1 piece of gum every 1-2 hours while awake. Do not exceed 24 pieces of gum in a day. 08/29/17   John Giovanni, MD    Allergies    Patient has no known allergies.  Review of Systems   Review of Systems  Constitutional:  Negative for chills, diaphoresis, fatigue and fever.  HENT:  Negative for congestion,  rhinorrhea and sneezing.   Eyes: Negative.   Respiratory:  Negative for cough, chest tightness and shortness of breath.   Cardiovascular:  Negative for chest pain and leg swelling.  Gastrointestinal:  Negative for abdominal pain, blood in stool, diarrhea, nausea and vomiting.  Genitourinary:  Negative for difficulty urinating, flank pain, frequency and hematuria.  Musculoskeletal:  Negative for arthralgias and back pain.  Skin:  Negative for rash.  Neurological:  Positive for speech difficulty and numbness. Negative for dizziness, weakness and headaches.   Physical Exam Updated Vital Signs BP 133/84   Pulse 68   Temp 98.2 F (36.8 C)   Resp 15   SpO2 98%   Physical Exam Constitutional:      Appearance: She is well-developed.  HENT:     Head: Normocephalic and atraumatic.  Eyes:     Pupils: Pupils are equal, round, and reactive to light.  Cardiovascular:      Rate and Rhythm: Normal rate and regular rhythm.     Heart sounds: Normal heart sounds.  Pulmonary:     Effort: Pulmonary effort is normal. No respiratory distress.     Breath sounds: Normal breath sounds. No wheezing or rales.  Chest:     Chest wall: No tenderness.  Abdominal:     General: Bowel sounds are normal.     Palpations: Abdomen is soft.     Tenderness: There is no abdominal tenderness. There is no guarding or rebound.  Musculoskeletal:        General: Normal range of motion.     Cervical back: Normal range of motion and neck supple.  Lymphadenopathy:     Cervical: No cervical adenopathy.  Skin:    General: Skin is warm and dry.     Findings: No rash.  Neurological:     Mental Status: She is alert and oriented to person, place, and time.     Comments: Patient has some numbness to her left arm and a slight pronator drift.  She has a little bit of facial drooping on the left with some slurred speech.  She has some diminished sensation to light touch to the left arm as compared to the right.  Lower extremities have normal motor and sensory exam.    ED Results / Procedures / Treatments   Labs (all labs ordered are listed, but only abnormal results are displayed) Labs Reviewed  CBC - Abnormal; Notable for the following components:      Result Value   Hemoglobin 15.1 (*)    MCHC 36.4 (*)    All other components within normal limits  COMPREHENSIVE METABOLIC PANEL - Abnormal; Notable for the following components:   Potassium 3.3 (*)    Glucose, Bld 109 (*)    Albumin 3.4 (*)    AST 71 (*)    ALT 94 (*)    All other components within normal limits  HEMOGLOBIN A1C - Abnormal; Notable for the following components:   Hgb A1c MFr Bld 4.7 (*)    All other components within normal limits  I-STAT CHEM 8, ED - Abnormal; Notable for the following components:   Potassium 3.3 (*)    Glucose, Bld 111 (*)    Calcium, Ion 1.12 (*)    All other components within normal limits   CBG MONITORING, ED - Abnormal; Notable for the following components:   Glucose-Capillary 107 (*)    All other components within normal limits  RESP PANEL BY RT-PCR (FLU A&B, COVID) ARPGX2  PROTIME-INR  APTT  DIFFERENTIAL  LIPID PANEL  ACETYLCHOLINE RECEPTOR AB, ALL  MUSK ANTIBODIES  MISC [facility] TEST (SEND OUT)    EKG None  Radiology CT Angio Head W/Cm &/Or Wo Cm  Result Date: 02/04/2021 CLINICAL DATA:  Neuro deficit, stroke suspected EXAM: CT ANGIOGRAPHY HEAD AND NECK TECHNIQUE: Multidetector CT imaging of the head and neck was performed using the standard protocol during bolus administration of intravenous contrast. Multiplanar CT image reconstructions and MIPs were obtained to evaluate the vascular anatomy. Carotid stenosis measurements (when applicable) are obtained utilizing NASCET criteria, using the distal internal carotid diameter as the denominator. CONTRAST:  80mL OMNIPAQUE IOHEXOL 350 MG/ML SOLN COMPARISON:  Brain MRI and head CT obtained 1 day prior FINDINGS: CT HEAD FINDINGS Brain: There is hypodensity in the right precentral gyrus corresponding to the area of FLAIR signal abnormality seen on the prior MRI. Encephalomalacia in the right occipital lobe is consistent with remote infarct. There is no evidence of acute intracranial hemorrhage or extra-axial fluid collection. The ventricles are enlarged. There is no midline shift. Skull: Normal. Negative for fracture or focal lesion. Sinuses: Clear. Orbits: Unremarkable. Review of the MIP images confirms the above findings CTA NECK FINDINGS Aortic arch: There is mild calcification of the aortic arch. Right carotid system: There is a tiny outpouching arising from the proximal right external carotid artery (11-41). Otherwise, there is is no evidence of dissection, stenosis (50% or greater) or occlusion. Left carotid system: There is mild soft and calcified atherosclerotic plaque of the carotid bulb without hemodynamically significant  stenosis or occlusion. There is no evidence of dissection. Vertebral arteries: Patent without evidence of stenosis, occlusion, or dissection. Skeleton: There is no acute osseous abnormality. Other neck: There is extensive dental disease. Upper chest: The lung apices are clear. Review of the MIP images confirms the above findings CTA HEAD FINDINGS Anterior circulation: There is mild calcification of the bilateral cavernous ICAs without significant stenosis. There is focal stenosis of the inferior M2 division just after the origin (9-117). The distal right MCA branches are patent. The remainder of the anterior circulation is patent Posterior circulation:  Patent. Venous sinuses: Patent. Review of the MIP images confirms the above findings IMPRESSION: 1. Evolving subacute infarct in the right precentral gyrus. No new infarct or hemorrhage. 2. Focal stenosis at the origin of the right inferior M2 division. The distal right MCA branches are patent. 3. Mild calcified atherosclerotic plaque of the left carotid bulb and bilateral cavernous ICAs. 4. Tiny outpouching arising from the proximal right external carotid artery may reflect ulcerated atherosclerotic plaque. Electronically Signed   By: Lesia Hausen MD   On: 02/04/2021 10:06   CT HEAD WO CONTRAST  Result Date: 02/03/2021 CLINICAL DATA:  Neuro deficit, acute, stroke suspected; dysarthria and left hand numbness EXAM: CT HEAD WITHOUT CONTRAST TECHNIQUE: Contiguous axial images were obtained from the base of the skull through the vertex without intravenous contrast. COMPARISON:  None. FINDINGS: Brain: There is no acute intracranial hemorrhage. Small area of hypoattenuation with loss of gray-white differentiation is present in the right frontal lobe likely involving the precentral gyrus. Small chronic infarct of the parasagittal right occipital lobe with volume loss. No significant mass effect. No hydrocephalus or extra-axial collection. Vascular: No hyperdense vessel.  Skull: Calvarium is unremarkable. Sinuses/Orbits: No acute finding. Other: None. IMPRESSION: Acute or subacute small right frontal infarction likely involving the precentral gyrus. No acute intracranial hemorrhage. Small chronic parasagittal right occipital infarct. These results were called by telephone at the time  of interpretation on 02/03/2021 at 3:40 pm to provider [facility] , who verbally acknowledged these results. Electronically Signed   By: Guadlupe SpanishPraneil  Patel M.D.   On: 02/03/2021 15:40   CT Angio Neck W and/or Wo Contrast  Result Date: 02/04/2021 CLINICAL DATA:  Neuro deficit, stroke suspected EXAM: CT ANGIOGRAPHY HEAD AND NECK TECHNIQUE: Multidetector CT imaging of the head and neck was performed using the standard protocol during bolus administration of intravenous contrast. Multiplanar CT image reconstructions and MIPs were obtained to evaluate the vascular anatomy. Carotid stenosis measurements (when applicable) are obtained utilizing NASCET criteria, using the distal internal carotid diameter as the denominator. CONTRAST:  80mL OMNIPAQUE IOHEXOL 350 MG/ML SOLN COMPARISON:  Brain MRI and head CT obtained 1 day prior FINDINGS: CT HEAD FINDINGS Brain: There is hypodensity in the right precentral gyrus corresponding to the area of FLAIR signal abnormality seen on the prior MRI. Encephalomalacia in the right occipital lobe is consistent with remote infarct. There is no evidence of acute intracranial hemorrhage or extra-axial fluid collection. The ventricles are enlarged. There is no midline shift. Skull: Normal. Negative for fracture or focal lesion. Sinuses: Clear. Orbits: Unremarkable. Review of the MIP images confirms the above findings CTA NECK FINDINGS Aortic arch: There is mild calcification of the aortic arch. Right carotid system: There is a tiny outpouching arising from the proximal right external carotid artery (11-41). Otherwise, there is is no evidence of dissection, stenosis (50% or greater)  or occlusion. Left carotid system: There is mild soft and calcified atherosclerotic plaque of the carotid bulb without hemodynamically significant stenosis or occlusion. There is no evidence of dissection. Vertebral arteries: Patent without evidence of stenosis, occlusion, or dissection. Skeleton: There is no acute osseous abnormality. Other neck: There is extensive dental disease. Upper chest: The lung apices are clear. Review of the MIP images confirms the above findings CTA HEAD FINDINGS Anterior circulation: There is mild calcification of the bilateral cavernous ICAs without significant stenosis. There is focal stenosis of the inferior M2 division just after the origin (9-117). The distal right MCA branches are patent. The remainder of the anterior circulation is patent Posterior circulation:  Patent. Venous sinuses: Patent. Review of the MIP images confirms the above findings IMPRESSION: 1. Evolving subacute infarct in the right precentral gyrus. No new infarct or hemorrhage. 2. Focal stenosis at the origin of the right inferior M2 division. The distal right MCA branches are patent. 3. Mild calcified atherosclerotic plaque of the left carotid bulb and bilateral cavernous ICAs. 4. Tiny outpouching arising from the proximal right external carotid artery may reflect ulcerated atherosclerotic plaque. Electronically Signed   By: Lesia HausenPeter  Noone MD   On: 02/04/2021 10:06   MR BRAIN WO CONTRAST  Result Date: 02/03/2021 CLINICAL DATA:  TIA, slurring words and left-sided weakness EXAM: MRI HEAD WITHOUT CONTRAST TECHNIQUE: Multiplanar, multiecho pulse sequences of the brain and surrounding structures were obtained without intravenous contrast. COMPARISON:  MRI brain 2019, correlation made with CT earlier same day FINDINGS: Brain: There are foci of diffusion hyperintensity with mixed EDC involving the right precentral gyrus and adjacent centrum semiovale. Chronic left pontine infarct. Chronic parasagittal right occipital  infarct. Additional few small foci of T2 hyperintensity in the supratentorial white matter nonspecific but may reflect minor chronic microvascular ischemic changes. Focus of susceptibility in the parasagittal left parietal subcortical white matter is most compatible with chronic microhemorrhage. No mass effect. No hydrocephalus or extra-axial collection. Ventricles are normal in size and configuration. Vascular: Major vessel flow voids  at the skull base are preserved. Skull and upper cervical spine: Normal marrow signal is preserved. Sinuses/Orbits: Paranasal sinuses are aerated. Orbits are unremarkable. Other: Sella is unremarkable.  Mastoid air cells are clear. IMPRESSION: Areas of acute, subacute, and chronic infarction along the right precentral gyrus and adjacent centrum semiovale. Chronic left pontine and right occipital infarcts. Electronically Signed   By: Guadlupe Spanish M.D.   On: 02/03/2021 17:44    Procedures Procedures   Medications Ordered in ED Medications  sodium chloride flush (NS) 0.9 % injection 3 mL (3 mLs Intravenous Not Given 02/04/21 0746)  iohexol (OMNIPAQUE) 350 MG/ML injection 80 mL (80 mLs Intravenous Contrast Given 02/04/21 0942)    ED Course  I have reviewed the triage vital signs and the nursing notes.  Pertinent labs & imaging results that were available during my care of the patient were reviewed by me and considered in my medical decision making (see chart for details).    MDM Rules/Calculators/A&P                           Patient is a 56 year old female who presents after being diagnosed with stroke symptoms yesterday.  Her MRI did show acute and subacute infarcts.  She is back today for similar symptoms.  No worsening symptoms.  Neurology has seen the patient and recommends admission to the hospitalist service.  Her labs are nonconcerning.  CTA reveals an evolving infarct and a stenosis of the M2 branch.  I spoke with the family medicine resident who admit the  patient for further treatment.  CRITICAL CARE Performed by: Rolan Bucco Total critical care time: 45 minutes Critical care time was exclusive of separately billable procedures and treating other patients. Critical care was necessary to treat or prevent imminent or life-threatening deterioration. Critical care was time spent personally by me on the following activities: development of treatment plan with patient and/or surrogate as well as nursing, discussions with consultants, evaluation of patient's response to treatment, examination of patient, obtaining history from patient or surrogate, ordering and performing treatments and interventions, ordering and review of laboratory studies, ordering and review of radiographic studies, pulse oximetry and re-evaluation of patient's condition.  Final Clinical Impression(s) / ED Diagnoses Final diagnoses:  Cerebral infarction, unspecified mechanism [facility])    Rx / DC Orders ED Discharge Orders     None        Rolan Bucco, MD 02/04/21 1032

## 2021-02-04 NOTE — Progress Notes (Signed)
Physical Therapy Evaluation & Discharge Patient Details Name: Diana Harris MRN: [id number] DOB: [birth year] Today's Date: 02/04/2021   History of Present Illness  Pt is a 55yo female presenting to [facility] ED on 8/5 with chief complaint of dysarthria and left hand numbness. MRI found infarction along the right  precentral gyrus. Of note, pt presented on 8/4 with same symptoms but discharged AMA and returned 8/5. PMH: history of CVAs, alcohol abuse, smoker.  Clinical Impression  Pt presents with impairments above and problems listed below. Pt independent with bed mobility and transfers; min guard assist provided during ambulation for safety, no physical assist required. High level balance assessed via DGI and no LOB noted. Provided educated on stroke response via "BE FAST" acronym. Pt reported family and friends can provide 24/7 support at discharge. Pt reports she is near her baseline level of function. No further acute skilled needs. Will sign off. Should the pt's needs change, please re-consult.     Follow Up Recommendations No PT follow up    Equipment Recommendations  None recommended by PT    Recommendations for Other Services       Precautions / Restrictions Precautions Precautions: None Restrictions Weight Bearing Restrictions: No      Mobility  Bed Mobility Overal bed mobility: Independent                  Transfers Overall transfer level: Independent                  Ambulation/Gait Ambulation/Gait assistance: Min guard Gait Distance (Feet): 200 Feet Assistive device: None Gait Pattern/deviations: WFL(Within Functional Limits) Gait velocity: WFL   General Gait Details: No gait deficits noted. Able to perform DGI tasks without LOB. Min guard for safety only, no physical assist required.  Stairs            Wheelchair Mobility    Modified Rankin (Stroke Patients Only) Modified Rankin (Stroke Patients Only) Pre-Morbid Rankin Score: No  symptoms Modified Rankin: No significant disability     Balance Overall balance assessment: Needs assistance Sitting-balance support: No upper extremity supported Sitting balance-Leahy Scale: Normal     Standing balance support: No upper extremity supported Standing balance-Leahy Scale: Good                   Standardized Balance Assessment Standardized Balance Assessment : Dynamic Gait Index   Dynamic Gait Index Level Surface: Normal Change in Gait Speed: Normal Gait with Horizontal Head Turns: Mild Impairment Gait with Vertical Head Turns: Mild Impairment Gait and Pivot Turn: Normal Step Over Obstacle: Normal Step Around Obstacles: Normal       Pertinent Vitals/Pain Pain Assessment: No/denies pain    Home Living Family/patient expects to be discharged to:: Private residence Living Arrangements: Alone Available Help at Discharge: Family;Friend(s);Available 24 hours/day Type of Home: Apartment Home Access: Stairs to enter Entrance Stairs-Rails: Right Entrance Stairs-Number of Steps: 6 Home Layout: One level Home Equipment: None      Prior Function Level of Independence: Independent         Comments: Works at [employer]   Dominant Hand: Right    Extremity/Trunk Assessment   Upper Extremity Assessment Upper Extremity Assessment: Defer to OT evaluation    Lower Extremity Assessment Lower Extremity Assessment: Overall WFL for tasks assessed    Cervical / Trunk Assessment Cervical / Trunk Assessment: Normal  Communication   Communication: No difficulties  Cognition Arousal/Alertness: Awake/alert Behavior During Therapy: [facility] for  tasks assessed/performed Overall Cognitive Status: No family/caregiver present to determine baseline cognitive functioning                                 General Comments: Pt agreeable and able to answer all questions appropriately.      General Comments General comments (skin  integrity, edema, etc.): Educated about "BE FAST" acronym to recognize CVA symptoms.    Exercises     Assessment/Plan    PT Assessment Patent does not need any further PT services  PT Problem List         PT Treatment Interventions      PT Goals (Current goals can be found in the Care Plan section)  Acute Rehab PT Goals PT Goal Formulation: All assessment and education complete, DC therapy Time For Goal Achievement: 02/04/21 Potential to Achieve Goals: Good    Frequency     Barriers to discharge        Co-evaluation               AM-PAC PT "6 Clicks" Mobility  Outcome Measure Help needed turning from your back to your side while in a flat bed without using bedrails?: None Help needed moving from lying on your back to sitting on the side of a flat bed without using bedrails?: None Help needed moving to and from a bed to a chair (including a wheelchair)?: None Help needed standing up from a chair using your arms (e.g., wheelchair or bedside chair)?: None Help needed to walk in hospital room?: None Help needed climbing 3-5 steps with a railing? : None 6 Click Score: 24    End of Session Equipment Utilized During Treatment: Gait belt Activity Tolerance: Patient tolerated treatment well Patient left: in bed;with call bell/phone within reach (on stretcher in ED) Nurse Communication: Mobility status PT Visit Diagnosis: Muscle weakness (generalized) (M62.81);Other symptoms and signs involving the nervous system (R29.898)    Time: 6378-5885 PT Time Calculation (min) (ACUTE ONLY): 22 min   Charges:   PT Evaluation $PT Eval Low Complexity: 1 Low          Johnn Hai, SPT  Johnn Hai 02/04/2021, 1:46 PM

## 2021-02-05 ENCOUNTER — Encounter (HOSPITAL_COMMUNITY): Payer: Self-pay | Admitting: Family Medicine

## 2021-02-05 DIAGNOSIS — F1410 Cocaine abuse, uncomplicated: Secondary | ICD-10-CM

## 2021-02-05 DIAGNOSIS — Z8673 Personal history of transient ischemic attack (TIA), and cerebral infarction without residual deficits: Secondary | ICD-10-CM | POA: Diagnosis not present

## 2021-02-05 DIAGNOSIS — F17200 Nicotine dependence, unspecified, uncomplicated: Secondary | ICD-10-CM

## 2021-02-05 DIAGNOSIS — Z7902 Long term (current) use of antithrombotics/antiplatelets: Secondary | ICD-10-CM | POA: Diagnosis not present

## 2021-02-05 DIAGNOSIS — F17210 Nicotine dependence, cigarettes, uncomplicated: Secondary | ICD-10-CM | POA: Diagnosis not present

## 2021-02-05 DIAGNOSIS — R200 Anesthesia of skin: Secondary | ICD-10-CM | POA: Diagnosis not present

## 2021-02-05 DIAGNOSIS — Z20822 Contact with and (suspected) exposure to covid-19: Secondary | ICD-10-CM | POA: Diagnosis not present

## 2021-02-05 DIAGNOSIS — E876 Hypokalemia: Secondary | ICD-10-CM | POA: Diagnosis not present

## 2021-02-05 DIAGNOSIS — I6319 Cerebral infarction due to embolism of other precerebral artery: Secondary | ICD-10-CM | POA: Diagnosis not present

## 2021-02-05 DIAGNOSIS — I639 Cerebral infarction, unspecified: Secondary | ICD-10-CM

## 2021-02-05 DIAGNOSIS — Z7982 Long term (current) use of aspirin: Secondary | ICD-10-CM | POA: Diagnosis not present

## 2021-02-05 LAB — COMPREHENSIVE METABOLIC PANEL
ALT: 83 U/L — ABNORMAL HIGH (ref 0–44)
AST: 62 U/L — ABNORMAL HIGH (ref 15–41)
Albumin: 2.9 g/dL — ABNORMAL LOW (ref 3.5–5.0)
Alkaline Phosphatase: 56 U/L (ref 38–126)
Anion gap: 5 (ref 5–15)
BUN: 11 mg/dL (ref 6–20)
CO2: 26 mmol/L (ref 22–32)
Calcium: 8.8 mg/dL — ABNORMAL LOW (ref 8.9–10.3)
Chloride: 106 mmol/L (ref 98–111)
Creatinine, Ser: 0.73 mg/dL (ref 0.44–1.00)
GFR, Estimated: >60 mL/min (ref >60)
Glucose, Bld: 118 mg/dL — ABNORMAL HIGH (ref 70–99)
Potassium: 3.9 mmol/L (ref 3.5–5.1)
Sodium: 137 mmol/L (ref 135–145)
Total Bilirubin: 0.8 mg/dL (ref 0.3–1.2)
Total Protein: 6 g/dL — ABNORMAL LOW (ref 6.5–8.1)

## 2021-02-05 LAB — PHOSPHORUS: Phosphorus: 4.2 mg/dL (ref 2.5–4.6)

## 2021-02-05 LAB — MAGNESIUM: Magnesium: 1.8 mg/dL (ref 1.7–2.4)

## 2021-02-05 LAB — RAPID URINE DRUG SCREEN, HOSP PERFORMED
Amphetamines: NOT DETECTED
Barbiturates: NOT DETECTED
Benzodiazepines: NOT DETECTED
Cocaine: POSITIVE — AB
Opiates: NOT DETECTED
Tetrahydrocannabinol: NOT DETECTED

## 2021-02-05 LAB — HIV ANTIBODY (ROUTINE TESTING W REFLEX): HIV Screen 4th Generation wRfx: NONREACTIVE

## 2021-02-05 MED ORDER — CLOPIDOGREL BISULFATE 75 MG PO TABS: 75.0000 mg | ORAL_TABLET | Freq: Every day | ORAL | 0 refills | Status: DC

## 2021-02-05 MED ORDER — ATORVASTATIN CALCIUM 80 MG PO TABS: 80.0000 mg | ORAL_TABLET | Freq: Every day | ORAL | 0 refills | Status: DC

## 2021-02-05 MED ORDER — NICOTINE POLACRILEX 2 MG MT GUM: 2.0000 mg | CHEWING_GUM | OROMUCOSAL | 1 refills | Status: DC

## 2021-02-05 MED ORDER — ASPIRIN 325 MG PO TABS: 325.0000 mg | ORAL_TABLET | Freq: Every day | ORAL | 0 refills | Status: DC

## 2021-02-05 MED ORDER — ASPIRIN EC 325 MG PO TBEC: 325.0000 mg | DELAYED_RELEASE_TABLET | Freq: Every day | ORAL | Status: DC

## 2021-02-05 NOTE — Progress Notes (Addendum)
STROKE TEAM PROGRESS NOTE   ATTENDING NOTE: I reviewed above note and agree with the assessment and plan. Pt was seen and examined.   55 year old female with history of smoker, cocaine abuse, and stroke in 08/2017 admitted for slurred speech, left arm weakness and numbness for 3 days.  CT showed right frontal infarct.  CT head and neck right ICA ulcerated plaque and right M2 stenosis.  MRI showed right MCA acute and subacute scattered infarcts, as well as old left pontine and right PCA infarct.  EF 70 to 75%, A1c 4.7, LDL 74.  UDS again positive for cocaine.  In 08/2017 patient admitted for headache, left side visual disturbance, MRI showed right occipital PCA infarct, likely due to cocaine use.  Discharged with aspirin and statin.  On exam, neuro intact except left hand clumsy with finger grip 4+/5 and dysmetria on finger-to-nose.  Etiology for patient stroke likely due to large vessel disease due to right ICA ulcerated plaque and right M2 stenosis in the setting of a cocaine use.  Recommend aspirin 325 and Plavix 75 DAPT for 3 months and then Plavix alone, continue Lipitor 80.  Smoking and cocaine cessation education provided.  Patient is willing to quit.  PT/OT no recommendations.  For detailed assessment and plan, please refer to above as I have made changes wherever appropriate.   Neurology will sign off. Please call with questions. Pt will follow up with stroke clinic NP at [facility] in about 4 weeks. Thanks for the consult.   Marvel Plan, MD PhD Stroke Neurology 02/06/2021 11:02 PM    INTERVAL HISTORY No acute events overnight. Today patient reports she feels essentially back to normal and would like to go home. She is willing to consider quitting smoking and using cocaine. SW has provided education.   Vitals:   02/05/21 0207 02/05/21 0505 02/05/21 0826 02/05/21 1309  BP:  126/79 129/89 130/79  Pulse: 61 60 73 64  Resp:  16 18 16   Temp:  98 F (36.7 C) 98.1 F (36.7 C) 98.1 F (36.7 C)   TempSrc:  Oral Oral Oral  SpO2:  94% 94% 95%  Weight:      Height:       CBC:  Recent Labs  Lab 02/03/21 1400 02/03/21 1425 02/04/21 0717 02/04/21 0730  WBC 6.2  --  5.7  --   NEUTROABS 3.5  --  2.9  --   HGB 14.4   < > 15.1* 14.6  HCT 40.0   < > 41.5 43.0  MCV 93.2  --  91.4  --   PLT 218  --  217  --    < > = values in this interval not displayed.   Basic Metabolic Panel:  Recent Labs  Lab 02/04/21 0717 02/04/21 0730 02/05/21 0048  NA 137 140 137  K 3.3* 3.3* 3.9  CL 105 105 106  CO2 24  --  26  GLUCOSE 109* 111* 118*  BUN 8 8 11   CREATININE 0.72 0.60 0.73  CALCIUM 9.1  --  8.8*  MG  --   --  1.8  PHOS  --   --  4.2   Lipid Panel:  Recent Labs  Lab 02/04/21 0707  CHOL 160  TRIG 87  HDL 69  CHOLHDL 2.3  VLDL 17  LDLCALC 74   HgbA1c:  Recent Labs  Lab 02/04/21 0707  HGBA1C 4.7*   Urine Drug Screen:  Recent Labs  Lab 02/05/21 1319  LABOPIA NONE DETECTED  COCAINSCRNUR  POSITIVE*  LABBENZ NONE DETECTED  AMPHETMU NONE DETECTED  THCU NONE DETECTED  LABBARB NONE DETECTED    Alcohol Level No results for input(s): ETH in the last 168 hours.  IMAGING past 24 hours ECHOCARDIOGRAM COMPLETE  Result Date: 02/04/2021    ECHOCARDIOGRAM REPORT   Patient Name:   Diana Harris Date of Exam: 02/04/2021 Medical Rec #:  [id number]        Height:       62.5 in Accession #:    [id number]       Weight:       135.0 lb Date of Birth:  21-Jul-1965         BSA:          1.627 m Patient Age:    55 years         BP:           149/90 mmHg Patient Gender: F                HR:           60 bpm. Exam Location:  Inpatient Procedure: 2D Echo, Color Doppler and Cardiac Doppler Indications:    Stroke i63.9  History:        Patient has prior history of Echocardiogram examinations, most                 recent 08/22/2017.  Sonographer:    Irving Burton Senior RDCS Referring Phys: [id number] Theador Hawthorne ENIOLA IMPRESSIONS  1. Left ventricular ejection fraction, by estimation, is 70 to 75%. The left  ventricle has hyperdynamic function. The left ventricle has no regional wall motion abnormalities. Left ventricular diastolic parameters were normal.  2. Right ventricular systolic function is normal. The right ventricular size is normal.  3. The mitral valve is normal in structure. No evidence of mitral valve regurgitation.  4. The aortic valve is normal in structure. Aortic valve regurgitation is not visualized. FINDINGS  Left Ventricle: Left ventricular ejection fraction, by estimation, is 70 to 75%. The left ventricle has hyperdynamic function. The left ventricle has no regional wall motion abnormalities. The left ventricular internal cavity size was small. There is no  left ventricular hypertrophy. Left ventricular diastolic parameters were normal. Right Ventricle: The right ventricular size is normal. Right vetricular wall thickness was not well visualized. Right ventricular systolic function is normal. Left Atrium: Left atrial size was normal in size. Right Atrium: Right atrial size was normal in size. Pericardium: There is no evidence of pericardial effusion. Mitral Valve: The mitral valve is normal in structure. No evidence of mitral valve regurgitation. Tricuspid Valve: The tricuspid valve is grossly normal. Tricuspid valve regurgitation is not demonstrated. Aortic Valve: The aortic valve is normal in structure. Aortic valve regurgitation is not visualized. Pulmonic Valve: The pulmonic valve was normal in structure. Pulmonic valve regurgitation is not visualized. Aorta: The aortic root and ascending aorta are structurally normal, with no evidence of dilitation. IAS/Shunts: The atrial septum is grossly normal.  LEFT VENTRICLE PLAX 2D LVIDd:         3.50 cm  Diastology LVIDs:         1.90 cm  LV e' medial:    7.62 cm/s LV PW:         0.90 cm  LV E/e' medial:  10.5 LV IVS:        1.00 cm  LV e' lateral:   8.27 cm/s LVOT diam:     2.10 cm  LV E/e' lateral: 9.7  LV SV:         73 LV SV Index:   45 LVOT Area:      3.46 cm  RIGHT VENTRICLE RV S prime:     11.70 cm/s TAPSE (M-mode): 2.2 cm LEFT ATRIUM             Index       RIGHT ATRIUM          Index LA diam:        3.20 cm 1.97 cm/m  RA Area:     7.50 cm LA Vol (A2C):   42.3 ml 25.99 ml/m RA Volume:   12.30 ml 7.56 ml/m LA Vol (A4C):   47.8 ml 29.37 ml/m LA Biplane Vol: 45.6 ml 28.02 ml/m  AORTIC VALVE LVOT Vmax:   85.50 cm/s LVOT Vmean:  53.000 cm/s LVOT VTI:    0.211 m  AORTA Ao Root diam: 2.70 cm Ao Asc diam:  3.00 cm MITRAL VALVE MV Area (PHT): 3.26 cm    SHUNTS MV Decel Time: 233 msec    Systemic VTI:  0.21 m MV E velocity: 80.10 cm/s  Systemic Diam: 2.10 cm MV A velocity: 90.80 cm/s MV E/A ratio:  0.88 Kristeen Miss MD Electronically signed by Kristeen Miss MD Signature Date/Time: 02/04/2021/5:00:42 PM    Final     PHYSICAL EXAM  Physical Examination:  Constitutional: Pleasant African American female sitting up in bed in good spirits.  Respiratory: Unlabored respirations  Neurological MS: AAOx4 Speech: Fluent. Repeats 5 word sentence. Names 3/3 objects.  CN: EOMI, VFF,no lid ptosis, Face symmetric, Tongue midline, Shoulder shrug intact Strength: Normal bulk and tone. Left pronator drift. Strength 5/5 throughout Sensation: Intact to light touch throughout. Objectively she states that the numbness to her left arm comes and goes Coordination: FNF + HTS intact Gait: Deferred  ASSESSMENT/PLAN Diana Harris is a 55 y.o. female w/pmh of prior stroke in 2018 likely due to cocaine use, alcohol abuse, tobacco smoker who presents with left sided numbness, weakness and dysarthria that began 3 days ago. Bilateral ptosis was also noted upon further evaluation prompting concern for possible myasthenia gravis.   Right pre central gyrus stroke occurred in the setting of cocaine use.   02/03/21 CT Head Acute or subacute small right frontal infarction likely involving the precentral gyrus.  No acute intracranial hemorrhage. Small chronic parasagittal  right occipital infarct.  02/03/21 MRI Brain WO Contrast  Areas of acute, subacute, and chronic infarction along the right precentral gyrus and adjacent centrum semiovale. Chronic left pontine and right occipital infarcts.  2D Echo: EF 70-75%. No thrombus, wall motion abnormality or shunt found.   LDL 74 HgbA1c 4.7 VTE prophylaxis - on lovenox per primary team     Diet   Diet regular Room service appropriate? Yes; Fluid consistency: Thin   Stroke treatment: DAPT for 21 days followed by Aspirin monotherapy Therapy recommendations:  Cleared without needs  Disposition:  Home   Blood pressure management Stable, no hx of HTN Permissive hypertension (OK if < 220/120) but gradually normalize in 5-7 days Long-term BP goal normotensive  Hyperlipidemia Home meds:  None LDL 74, almost at goal < 70 AST/ALT  High intensity statin: Lipitor 40mg , Monitor liver function.  Continue statin at discharge.   Possible Myasthenia Gravis EMG/NCS outpatient pending Follow up pending labs ( MUSK, AChR, LRP4 Antibody) in clinic  Other Stroke Risk Factors Current Cigarette smoker,advised to stop smoking Current polysubstance abuse: ETOH and Cocaine. SW provided education.  Hx stroke/TIA   Hospital day # 0  Delila A Bailey-Modzik,NP-C   To contact Stroke Continuity provider, please refer to [email or web address]. After hours, contact General Neurology

## 2021-02-05 NOTE — TOC Initial Note (Signed)
Transition of Care [facility]) - Initial/Assessment Note    Patient Details  Name: Diana Harris MRN: [id number] Date of Birth: Aug 11, 1965  Transition of Care [facility]) CM/SW Contact:    Lawerance Sabal, RN Phone Number: 02/05/2021, 2:25 PM  Clinical Narrative:         Spoke to patient at bedside. She would like to follow up with Family Medicine. Message sent to resident Clent Ridges to see if clinic would accept and if they could assist with scheduling due to weekend hours. No other CM needs identified.         Expected Discharge Plan: Home/Self Care Barriers to Discharge: No Barriers Identified   Patient Goals and CMS Choice        Expected Discharge Plan and Services Expected Discharge Plan: Home/Self Care                                              Prior Living Arrangements/Services                       Activities of Daily Living Home Assistive Devices/Equipment: None ADL Screening (condition at time of admission) Patient's cognitive ability adequate to safely complete daily activities?: Yes Is the patient deaf or have difficulty hearing?: No Does the patient have difficulty seeing, even when wearing glasses/contacts?: No Does the patient have difficulty concentrating, remembering, or making decisions?: No Patient able to express need for assistance with ADLs?: Yes Does the patient have difficulty dressing or bathing?: No Independently performs ADLs?: Yes (appropriate for developmental age) Does the patient have difficulty walking or climbing stairs?: No Weakness of Legs: None Weakness of Arms/Hands: None  Permission Sought/Granted                  Emotional Assessment              Admission diagnosis:  CVA (cerebral vascular accident) (HCC) [I63.9] Cerebral infarction, unspecified mechanism (HCC) [I63.9] Patient Active Problem List   Diagnosis Date Noted   CVA (cerebral vascular accident) (HCC) 02/04/2021   Ischemic stroke (HCC) 08/22/2017    Tobacco use disorder 08/22/2017   PCP:  Patient, No Pcp Per (Inactive) Pharmacy:   [id number] - [place] - [address] AT [address] OF [facility] [address] [place] [address] Phone: [phone] Fax: [phone]     Social Determinants of Health (SDOH) Interventions    Readmission Risk Interventions No flowsheet data found.

## 2021-02-05 NOTE — Hospital Course (Addendum)
Diana Harris is a 55 y/o female who presented for slurred speech and found to have a CVA. Her PMHx is notable for prior CVA (2019), tobacco abuse and alcohol abuse. Below is her brief hospital course by problem. Please refer to the H&P for additional information.  Slurred Speech 2/2 Subacute Right Precentral Gyrus CVA Initially presented to the ED on 8/4 for complaints of slurred speech and left upper extremity numbness.  Last known normal was 2 days prior. CT brain showed right-sided frontal stroke, neurology was consulted and the patient was recommended for admission. Patient left AMA at this time as she was nervous and anxious and had some chores to take care of at home.  She was prescribed aspirin and Plavix upon leaving AMA.  She read presented to the ED the following day, 8/5, for further work-up and admission.  MRI brain notable for acute and subacute infarcts.  CTA head/neck notable for evolving infarct and stenosis of the M2 branch.  Neurological examination was notable for left pronator drift, subjective numbness.  Risk stratification labs notable for hemoglobin A1c 4.7, lipid panel with total cholesterol 160 and LDL of 74. Patient started on 80 mg of atorvastatin.  Echocardiogram with hyperdynamic LV with EF 70-75%.  PT/OT evaluated patient and had no further recommendations.  Recommended for DAPT with aspirin and Plavix for 21 days followed by aspirin monotherapy.  Bilateral Ptsosis She was incidentally noted to have bilateral lid ptosis encroaching upon pupils noted by Neurology. Also with complaints of lethargy and tiredness in the afternoon. Myasthenia Gravis labs obtained included MUSK, AChR, LRP4 Antibody. These were pending at time of discharge.   Tobacco Abuse  Alcohol Abuse Patient was counseled on importance of cessation to prevent further strokes.  CIWA protocol in place given her history of drinking a 6 pack of beer nightly. Scores were 0 throughout admission.   PCP  Recommendations: Continue to encourage alcohol and tobacco cessation Atorvastatin increased from 40 mg to 80 mg on d/c. Consider repeat lipid panel in 4-6 weeks. Should follow up with Neurology outpatient Follow up on Myasthenia Gravis labs that were pending at time of discharge Recommended for EMG/NCS with rep stim to evaluate for potential Myasthenia outpatient

## 2021-02-05 NOTE — Progress Notes (Addendum)
FPTS Interim Night Progress Note  S:Patient sleeping comfortably.  Rounded with primary night RN.  No concerns voiced.  No orders required.    O: Today's Vitals   02/04/21 2328 02/05/21 0015 02/05/21 0017 02/05/21 0207  BP: 134/79 114/67    Pulse: 64 66  61  Resp: 18 20    Temp: 98.2 F (36.8 C) 97.8 F (36.6 C)    TempSrc: Oral Oral    SpO2: 98% 99%    Weight:  61.4 kg    Height:  5\' 2"  (1.575 m)    PainSc:   0-No pain       A/P: Continue current management  MD PGY-3, [facility] Family Medicine Service pager [phone]

## 2021-02-05 NOTE — Evaluation (Signed)
Occupational Therapy Evaluation Patient Details Name: Diana Harris MRN: [id number] DOB: 1966/05/18 Today's Date: 02/05/2021    History of Present Illness Pt is a 55yo female presenting to [facility] ED on 8/5 with chief complaint of dysarthria and left hand numbness. MRI found infarction along the right  precentral gyrus. Of note, pt presented on 8/4 with same symptoms but discharged AMA and returned 8/5. PMH: history of CVAs, alcohol abuse, smoker.   Clinical Impression   Pt Ox3, with good participation in session. Receptive to education and discussion of modification/adaptations to maximize indep. Grossly pt with only slight weakness and incoordination of L UE but pt is R hand dominant. Demo's ability to complete all ADL's and mobility in room with distant Sup-indep without LOB. Pt endorsing family/friends available to provide S/A as needed at time of d/c OT would recommend initial increased S for safety. Reviewed risk and sign of stroke with Be Fast, pt able to repeat/reiterate education. No acute or post acute OT indicated at this time. OT will sign off.     Follow Up Recommendations  No OT follow up    Equipment Recommendations  None recommended by OT    Recommendations for Other Services       Precautions / Restrictions Precautions Precautions: None Restrictions Weight Bearing Restrictions: No      Mobility Bed Mobility Overal bed mobility: Independent                  Transfers Overall transfer level: Independent                    Balance Overall balance assessment: Needs assistance Sitting-balance support: No upper extremity supported Sitting balance-Leahy Scale: Normal                                     ADL either performed or assessed with clinical judgement   ADL Overall ADL's : At baseline                                       General ADL Comments: demos need for only distant Sup for simulated seated and standing  ADL's. fair safety, benefiting from cues for recommendations/modification of task to maximize indep and safety during ADL's. no OB or concern for AD: at this time.     Vision Baseline Vision/History: No visual deficits Patient Visual Report: No change from baseline       Perception     Praxis      Pertinent Vitals/Pain Pain Assessment: No/denies pain     Hand Dominance Right   Extremity/Trunk Assessment Upper Extremity Assessment Upper Extremity Assessment: Overall WFL for tasks assessed   Lower Extremity Assessment Lower Extremity Assessment: Defer to PT evaluation   Cervical / Trunk Assessment Cervical / Trunk Assessment: Normal   Communication Communication Communication: No difficulties   Cognition Arousal/Alertness: Awake/alert Behavior During Therapy: WFL for tasks assessed/performed Overall Cognitive Status: No family/caregiver present to determine baseline cognitive functioning                                 General Comments: pt grossly agreeable and oriented, does demo ?insight to deficits.   General Comments  tolerated session well, no reports of dizziness or fatigue with participation  Exercises     Shoulder Instructions      Home Living Family/patient expects to be discharged to:: Private residence Living Arrangements: Alone Available Help at Discharge: Family;Friend(s);Available PRN/intermittently Type of Home: Apartment Home Access: Stairs to enter Entrance Stairs-Number of Steps: 6 ste main enterance and 4 ste the apartment on below ground level Entrance Stairs-Rails: Right Home Layout: One level     Bathroom Shower/Tub: Chief Strategy Officer: Standard Bathroom Accessibility: Yes   Home Equipment: None   Additional Comments: grab bar in shower, no other DME available      Prior Functioning/Environment Level of Independence: Independent        Comments: does not drive, uses public transportation, works  at [employer]        OT Problem List:        OT Treatment/Interventions:      OT Goals(Current goals can be found in the care plan section) Acute Rehab OT Goals Patient Stated Goal: to go home today OT Goal Formulation: All assessment and education complete, DC therapy  OT Frequency:     Barriers to D/C:            Co-evaluation              AM-PAC OT "6 Clicks" Daily Activity     Outcome Measure Help from another person eating meals?: None Help from another person taking care of personal grooming?: None Help from another person toileting, which includes using toliet, bedpan, or urinal?: None Help from another person bathing (including washing, rinsing, drying)?: None Help from another person to put on and taking off regular upper body clothing?: None Help from another person to put on and taking off regular lower body clothing?: None 6 Click Score: 24   End of Session    Activity Tolerance: Patient tolerated treatment well Patient left: in bed;with call bell/phone within reach  OT Visit Diagnosis: Other symptoms and signs involving the nervous system (R29.898)                Time: 1010-1024 OT Time Calculation (min): 14 min Charges:  OT General Charges $OT Visit: 1 Visit OT Evaluation $OT Eval Low Complexity: 1 Low  Keyoni Lapinski OTR/L acute rehab services Office: [phone]  02/05/2021, 11:12 AM

## 2021-02-05 NOTE — Discharge Instructions (Addendum)
Dear Diana Harris,  Thank you for letting us participate in your care. You were hospitalized for slurred speech and found to have a stroke. You were treated with medications to help prevent further strokes including Aspirin and Plavix. You were evaluated by both Physical therapy and Occupational therapy. Social work was able to meet with you to provide resources for tobacco and alcohol cessation. This would be very important in preventing further strokes.    POST-HOSPITAL & CARE INSTRUCTIONS We have increased your cholesterol medication. Please take 80 mg of Atorvastatin daily.  You should take both Aspirin 325mg  and Plavix 75mg  for three months (until November 2022).  Continue to work towards stopping both Alcohol and Tobacco. The 1-800-QUIT-NOW hotline can also provide resources to help with stopping tobacco. Please continue to take Lipitor daily to help keep your cholesterol low.  You will follow up with Dr. in 2 months for  your neurology follow up to discuss further recommendations on your medications.  Go to your follow up appointments (listed below)   DOCTOR'S APPOINTMENT   No future appointments.   Take care and be well!  Family Medicine Teaching Service Inpatient Team [place]  [facility]  [address] [place], [address] [place] [phone]

## 2021-02-05 NOTE — Discharge Summary (Signed)
Hello this is Dr. Roxan Hockey 8 family Medicine Teaching [facility] Discharge Summary  Patient name: Diana Harris Medical record number: [id number] Date of birth: October 05, 1965 Age: 55 y.o. Gender: female Date of Admission: 02/04/2021  Date of Discharge: 02/05/21 Admitting Physician: Derrel Nip, MD  Primary Care Provider: Patient, No Pcp Per (Inactive) Consultants: Neurology  Indication for Hospitalization: Slurred Speech  Discharge Diagnoses/Problem List:  Active Problems:   CVA (cerebral vascular accident) [facility])   Disposition: Home  Discharge Condition: Stable  Discharge Exam:  General: Female appearing stated age in no acute distress, sitting upright in bed HEENT: MMM, no oral lesions noted Cardio: Normal S1 and S2, no S3 or S4. Rhythm is regular. No murmurs or rubs.  Bilateral radial pulses palpable Pulm: Clear to auscultation bilaterally, no crackles, wheezing, or diminished breath sounds. Normal respiratory effort Abdomen: Bowel sounds normal. Abdomen soft and non-tender.  Extremities: No peripheral edema. Warm & well perfused.  Neuro: pt alert and oriented x4, follows commands, PERRLA, EOMI bilaterally, no ptosis noted on my exam today, patient eyelid muscles appear equal when tested against resistance, strength 5/5 in bilateral upper and lower extremities, patient notes some tenderness in left upper extremity due to IVDA blood pressure cuff in place, sensation intact in bilateral upper and lower extremities to light touch   Brief Hospital Course:  Diana Harris is a 55 y/o female who presented for slurred speech and found to have a CVA. Her PMHx is notable for prior CVA (2019), tobacco abuse and alcohol abuse. Below is her brief hospital course by problem. Please refer to the H&P for additional information.  Slurred Speech 2/2 Subacute Right Precentral Gyrus CVA Initially presented to the ED on 8/4 for complaints of slurred speech and left upper extremity numbness.  Last known  normal was 2 days prior. CT brain showed right-sided frontal stroke, neurology was consulted and the patient was recommended for admission. Patient left AMA at this time as she was nervous and anxious and had some chores to take care of at home.  She was prescribed aspirin and Plavix upon leaving AMA.  She read presented to the ED the following day, 8/5, for further work-up and admission.  MRI brain notable for acute and subacute infarcts.  CTA head/neck notable for evolving infarct and stenosis of the M2 branch.  Neurological examination was notable for left pronator drift, subjective numbness.  Risk stratification labs notable for hemoglobin A1c 4.7, lipid panel with total cholesterol 160 and LDL of 74. Patient started on 80 mg of atorvastatin.  Echocardiogram with hyperdynamic LV with EF 70-75%.  PT/OT evaluated patient and had no further recommendations.  Recommended for DAPT with aspirin 325 mg and Plavix 75 mg for 3 months with further recommendations to be determined at patient's follow-up appointment with neurology.  Bilateral Ptsosis She was incidentally noted to have bilateral lid ptosis encroaching upon pupils noted by Neurology. Also with complaints of lethargy and tiredness in the afternoon. Myasthenia Gravis labs obtained included MUSK, AChR, LRP4 Antibody. These were pending at time of discharge.  Patient scheduled for 35-month follow-up with neurology regarding these labs.   Tobacco Abuse  Alcohol Abuse Patient was counseled on importance of cessation to prevent further strokes.  CIWA protocol in place given her history of drinking a 6 pack of beer nightly. Scores were 0 throughout admission.   PCP Recommendations: Continue to encourage alcohol and tobacco cessation Patient is to take aspirin 325 mg along with Plavix 75 mg daily for 3 months.  Further recommendations will be decided at patient's neurology outpatient follow-up visit. Atorvastatin increased from 40 mg to 80 mg on d/c.  Consider repeat lipid panel in 4-6 weeks. Should follow up with Neurology outpatient Follow up on Myasthenia Gravis labs that were pending at time of discharge Recommended for EMG/NCS with rep stim to evaluate for potential Myasthenia outpatient  Significant Procedures: None  Significant Labs and Imaging:  Recent Labs  Lab 02/03/21 1400 02/03/21 1425 02/04/21 0717 02/04/21 0730  WBC 6.2  --  5.7  --   HGB 14.4 14.3 15.1* 14.6  HCT 40.0 42.0 41.5 43.0  PLT 218  --  217  --    Recent Labs  Lab 02/03/21 1400 02/03/21 1425 02/04/21 0717 02/04/21 0730 02/05/21 0048  NA 138 140 137 140 137  K 3.6 3.6 3.3* 3.3* 3.9  CL 105 106 105 105 106  CO2 24  --  24  --  26  GLUCOSE 116* 118* 109* 111* 118*  BUN CREATININE 0.88 0.80 0.72 0.60 0.73  CALCIUM 8.8*  --  9.1  --  8.8*  MG  --   --   --   --  1.8  PHOS  --   --   --   --  4.2  ALKPHOS 66  --  63  --  56  AST 71*  --  71*  --  62*  ALT 95*  --  94*  --  83*  ALBUMIN 3.5  --  3.4*  --  2.9*    CT Angio Head W/Cm &/Or Wo Cm  Result Date: 02/04/2021 CLINICAL DATA:  Neuro deficit, stroke suspected EXAM: CT ANGIOGRAPHY HEAD AND NECK TECHNIQUE: Multidetector CT imaging of the head and neck was performed using the standard protocol during bolus administration of intravenous contrast. Multiplanar CT image reconstructions and MIPs were obtained to evaluate the vascular anatomy. Carotid stenosis measurements (when applicable) are obtained utilizing NASCET criteria, using the distal internal carotid diameter as the denominator. CONTRAST:  80mL OMNIPAQUE IOHEXOL 350 MG/ML SOLN COMPARISON:  Brain MRI and head CT obtained 1 day prior FINDINGS: CT HEAD FINDINGS Brain: There is hypodensity in the right precentral gyrus corresponding to the area of FLAIR signal abnormality seen on the prior MRI. Encephalomalacia in the right occipital lobe is consistent with remote infarct. There is no evidence of acute intracranial hemorrhage or  extra-axial fluid collection. The ventricles are enlarged. There is no midline shift. Skull: Normal. Negative for fracture or focal lesion. Sinuses: Clear. Orbits: Unremarkable. Review of the MIP images confirms the above findings CTA NECK FINDINGS Aortic arch: There is mild calcification of the aortic arch. Right carotid system: There is a tiny outpouching arising from the proximal right external carotid artery (11-41). Otherwise, there is is no evidence of dissection, stenosis (50% or greater) or occlusion. Left carotid system: There is mild soft and calcified atherosclerotic plaque of the carotid bulb without hemodynamically significant stenosis or occlusion. There is no evidence of dissection. Vertebral arteries: Patent without evidence of stenosis, occlusion, or dissection. Skeleton: There is no acute osseous abnormality. Other neck: There is extensive dental disease. Upper chest: The lung apices are clear. Review of the MIP images confirms the above findings CTA HEAD FINDINGS Anterior circulation: There is mild calcification of the bilateral cavernous ICAs without significant stenosis. There is focal stenosis of the inferior M2 division just after the origin (9-117). The distal right MCA branches are patent. The remainder of the  anterior circulation is patent Posterior circulation:  Patent. Venous sinuses: Patent. Review of the MIP images confirms the above findings IMPRESSION: 1. Evolving subacute infarct in the right precentral gyrus. No new infarct or hemorrhage. 2. Focal stenosis at the origin of the right inferior M2 division. The distal right MCA branches are patent. 3. Mild calcified atherosclerotic plaque of the left carotid bulb and bilateral cavernous ICAs. 4. Tiny outpouching arising from the proximal right external carotid artery may reflect ulcerated atherosclerotic plaque. Electronically Signed   By: Lesia Hausen MD   On: 02/04/2021 10:06   CT Angio Neck W and/or Wo Contrast  Result Date:  02/04/2021 CLINICAL DATA:  Neuro deficit, stroke suspected EXAM: CT ANGIOGRAPHY HEAD AND NECK TECHNIQUE: Multidetector CT imaging of the head and neck was performed using the standard protocol during bolus administration of intravenous contrast. Multiplanar CT image reconstructions and MIPs were obtained to evaluate the vascular anatomy. Carotid stenosis measurements (when applicable) are obtained utilizing NASCET criteria, using the distal internal carotid diameter as the denominator. CONTRAST:  85mL OMNIPAQUE IOHEXOL 350 MG/ML SOLN COMPARISON:  Brain MRI and head CT obtained 1 day prior FINDINGS: CT HEAD FINDINGS Brain: There is hypodensity in the right precentral gyrus corresponding to the area of FLAIR signal abnormality seen on the prior MRI. Encephalomalacia in the right occipital lobe is consistent with remote infarct. There is no evidence of acute intracranial hemorrhage or extra-axial fluid collection. The ventricles are enlarged. There is no midline shift. Skull: Normal. Negative for fracture or focal lesion. Sinuses: Clear. Orbits: Unremarkable. Review of the MIP images confirms the above findings CTA NECK FINDINGS Aortic arch: There is mild calcification of the aortic arch. Right carotid system: There is a tiny outpouching arising from the proximal right external carotid artery (11-41). Otherwise, there is is no evidence of dissection, stenosis (50% or greater) or occlusion. Left carotid system: There is mild soft and calcified atherosclerotic plaque of the carotid bulb without hemodynamically significant stenosis or occlusion. There is no evidence of dissection. Vertebral arteries: Patent without evidence of stenosis, occlusion, or dissection. Skeleton: There is no acute osseous abnormality. Other neck: There is extensive dental disease. Upper chest: The lung apices are clear. Review of the MIP images confirms the above findings CTA HEAD FINDINGS Anterior circulation: There is mild calcification of the  bilateral cavernous ICAs without significant stenosis. There is focal stenosis of the inferior M2 division just after the origin (9-117). The distal right MCA branches are patent. The remainder of the anterior circulation is patent Posterior circulation:  Patent. Venous sinuses: Patent. Review of the MIP images confirms the above findings IMPRESSION: 1. Evolving subacute infarct in the right precentral gyrus. No new infarct or hemorrhage. 2. Focal stenosis at the origin of the right inferior M2 division. The distal right MCA branches are patent. 3. Mild calcified atherosclerotic plaque of the left carotid bulb and bilateral cavernous ICAs. 4. Tiny outpouching arising from the proximal right external carotid artery may reflect ulcerated atherosclerotic plaque. Electronically Signed   By: Lesia Hausen MD   On: 02/04/2021 10:06   MR BRAIN WO CONTRAST  Result Date: 02/03/2021 CLINICAL DATA:  TIA, slurring words and left-sided weakness EXAM: MRI HEAD WITHOUT CONTRAST TECHNIQUE: Multiplanar, multiecho pulse sequences of the brain and surrounding structures were obtained without intravenous contrast. COMPARISON:  MRI brain 2019, correlation made with CT earlier same day FINDINGS: Brain: There are foci of diffusion hyperintensity with mixed EDC involving the right precentral gyrus and adjacent centrum semiovale.  Chronic left pontine infarct. Chronic parasagittal right occipital infarct. Additional few small foci of T2 hyperintensity in the supratentorial white matter nonspecific but may reflect minor chronic microvascular ischemic changes. Focus of susceptibility in the parasagittal left parietal subcortical white matter is most compatible with chronic microhemorrhage. No mass effect. No hydrocephalus or extra-axial collection. Ventricles are normal in size and configuration. Vascular: Major vessel flow voids at the skull base are preserved. Skull and upper cervical spine: Normal marrow signal is preserved.  Sinuses/Orbits: Paranasal sinuses are aerated. Orbits are unremarkable. Other: Sella is unremarkable.  Mastoid air cells are clear. IMPRESSION: Areas of acute, subacute, and chronic infarction along the right precentral gyrus and adjacent centrum semiovale. Chronic left pontine and right occipital infarcts. Electronically Signed   By: Guadlupe SpanishPraneil  Patel M.D.   On: 02/03/2021 17:44   ECHOCARDIOGRAM COMPLETE  Result Date: 02/04/2021    ECHOCARDIOGRAM REPORT   Patient Name:   Diana Harris Date of Exam: 02/04/2021 Medical Rec #:  [id number]        Height:       62.5 in Accession #:    [id number]       Weight:       135.0 lb Date of Birth:  02/03/1966         BSA:          1.627 m Patient Age:    55 years         BP:           149/90 mmHg Patient Gender: F                HR:           60 bpm. Exam Location:  Inpatient Procedure: 2D Echo, Color Doppler and Cardiac Doppler Indications:    Stroke i63.9  History:        Patient has prior history of Echocardiogram examinations, most                 recent 08/22/2017.  Sonographer:    Irving BurtonEmily Senior RDCS Referring Phys: [id number] Theador HawthorneKEHINDE T ENIOLA IMPRESSIONS  1. Left ventricular ejection fraction, by estimation, is 70 to 75%. The left ventricle has hyperdynamic function. The left ventricle has no regional wall motion abnormalities. Left ventricular diastolic parameters were normal.  2. Right ventricular systolic function is normal. The right ventricular size is normal.  3. The mitral valve is normal in structure. No evidence of mitral valve regurgitation.  4. The aortic valve is normal in structure. Aortic valve regurgitation is not visualized. FINDINGS  Left Ventricle: Left ventricular ejection fraction, by estimation, is 70 to 75%. The left ventricle has hyperdynamic function. The left ventricle has no regional wall motion abnormalities. The left ventricular internal cavity size was small. There is no  left ventricular hypertrophy. Left ventricular diastolic parameters were normal.  Right Ventricle: The right ventricular size is normal. Right vetricular wall thickness was not well visualized. Right ventricular systolic function is normal. Left Atrium: Left atrial size was normal in size. Right Atrium: Right atrial size was normal in size. Pericardium: There is no evidence of pericardial effusion. Mitral Valve: The mitral valve is normal in structure. No evidence of mitral valve regurgitation. Tricuspid Valve: The tricuspid valve is grossly normal. Tricuspid valve regurgitation is not demonstrated. Aortic Valve: The aortic valve is normal in structure. Aortic valve regurgitation is not visualized. Pulmonic Valve: The pulmonic valve was normal in structure. Pulmonic valve regurgitation is not visualized. Aorta: The aortic root and  ascending aorta are structurally normal, with no evidence of dilitation. IAS/Shunts: The atrial septum is grossly normal.  LEFT VENTRICLE PLAX 2D LVIDd:         3.50 cm  Diastology LVIDs:         1.90 cm  LV e' medial:    7.62 cm/s LV PW:         0.90 cm  LV E/e' medial:  10.5 LV IVS:        1.00 cm  LV e' lateral:   8.27 cm/s LVOT diam:     2.10 cm  LV E/e' lateral: 9.7 LV SV:         73 LV SV Index:   45 LVOT Area:     3.46 cm  RIGHT VENTRICLE RV S prime:     11.70 cm/s TAPSE (M-mode): 2.2 cm LEFT ATRIUM             Index       RIGHT ATRIUM          Index LA diam:        3.20 cm 1.97 cm/m  RA Area:     7.50 cm LA Vol (A2C):   42.3 ml 25.99 ml/m RA Volume:   12.30 ml 7.56 ml/m LA Vol (A4C):   47.8 ml 29.37 ml/m LA Biplane Vol: 45.6 ml 28.02 ml/m  AORTIC VALVE LVOT Vmax:   85.50 cm/s LVOT Vmean:  53.000 cm/s LVOT VTI:    0.211 m  AORTA Ao Root diam: 2.70 cm Ao Asc diam:  3.00 cm MITRAL VALVE MV Area (PHT): 3.26 cm    SHUNTS MV Decel Time: 233 msec    Systemic VTI:  0.21 m MV E velocity: 80.10 cm/s  Systemic Diam: 2.10 cm MV A velocity: 90.80 cm/s MV E/A ratio:  0.88 Kristeen Miss MD Electronically signed by Kristeen Miss MD Signature Date/Time: 02/04/2021/5:00:42  PM    Final      Results/Tests Pending at Time of Discharge:  Unresulted Labs (From admission, onward)     Start     Ordered   02/11/21 0500  Creatinine, serum  (enoxaparin (LOVENOX)    CrCl >/= 30 ml/min)  Weekly,   R     Comments: while on enoxaparin therapy    02/04/21 1049   02/05/21 0500  Vitamin B1  Tomorrow morning,   R        02/04/21 1908   02/04/21 0838  MuSK Antibodies  Once,   STAT        02/04/21 0854   02/04/21 0838  Miscellaneous [facility] test (send-out)  Once,   STAT       Question:  Test name / description:  Answer:  LRP4 Antibody   02/04/21 0854   02/04/21 0837  Acetylcholine Receptor Ab, All  Once,   STAT        02/04/21 0854             Discharge Medications:  Allergies as of 02/05/2021   No Known Allergies      Medication List     TAKE these medications    aspirin 325 MG tablet Commonly known as: Bayer Aspirin Take 1 tablet (325 mg total) by mouth daily. What changed:  medication strength how much to take   atorvastatin 80 MG tablet Commonly known as: LIPITOR Take 1 tablet (80 mg total) by mouth daily.   clopidogrel 75 MG tablet Commonly known as: PLAVIX Take 1 tablet (75 mg total) by mouth daily.  nicotine polacrilex 2 MG gum Commonly known as: Nicorette Take 1 each (2 mg total) by mouth See admin instructions. Chew 1 piece of gum ( ) every 1-2 hours while awake. Do not exceed 24 pieces of gum ( ) in a day.        Discharge Instructions: Please refer to Patient Instructions section of EMR for full details.  Patient was counseled important signs and symptoms that should prompt return to medical care, changes in medications, dietary instructions, activity restrictions, and follow up appointments.   Follow-Up Appointments:  Follow-up Information     Micki Riley, MD. Schedule an appointment as soon as possible for a visit in 1 month(s).   Specialties: Neurology, Radiology Why: stroke clinic Contact information: [address] [place] [address] [phone]         [facility]. Go on 02/14/2021.   Why: Please complete the new patient packet and bring to your appointment on 02/14/21 at 10:45 AM (your appointment is 11:00 AM) Contact information: [address] [place] [address] [phone]                Ronnald Ramp, MD 02/05/2021, 3:29 PM PGY-3, [facility]

## 2021-02-05 NOTE — Plan of Care (Signed)
  Problem: Education: Goal: Knowledge of disease or condition will improve Outcome: Adequate for Discharge Goal: Knowledge of secondary prevention will improve Outcome: Adequate for Discharge Goal: Knowledge of patient specific risk factors addressed and post discharge goals established will improve Outcome: Adequate for Discharge   Problem: Self-Care: Goal: Ability to participate in self-care as condition permits will improve Outcome: Adequate for Discharge

## 2021-02-05 NOTE — TOC CAGE-AID Note (Signed)
Transition of Care [facility]) - CAGE-AID Screening   Patient Details  Name: Diana Harris MRN: [id number] Date of Birth: 1966/01/31  Transition of [facility]) CM/SW Contact:    Lawerance Sabal, RN Phone Number: 02/05/2021, 2:24 PM   Clinical Narrative: [name] w patient at bedside. She was agreeable to taking resources. Printed resources were provided.     CAGE-AID Screening:    Have You Ever Felt You Ought to Cut Down on Your Drinking or Drug Use?: Yes Have People Annoyed You By Critizing Your Drinking Or Drug Use?: No Have You Felt Bad Or Guilty About Your Drinking Or Drug Use?: No Have You Ever Had a Drink or Used Drugs First Thing In The Morning to Steady Your Nerves or to Get Rid of a Hangover?: No CAGE-AID Score: 1     Substance abuse interventions: Transport planner

## 2021-02-08 LAB — ACETYLCHOLINE RECEPTOR AB, ALL
Acety choline binding ab: <0.03 nmol/L (ref 0.00–0.24)
Acetylchol Block Ab: 14 % (ref 0–25)

## 2021-02-09 LAB — VITAMIN B1: Vitamin B1 (Thiamine): 154 nmol/L (ref 66.5–200.0)

## 2021-02-10 LAB — MUSK ANTIBODIES: MuSK Antibodies: <1 U/mL

## 2021-02-14 ENCOUNTER — Ambulatory Visit (INDEPENDENT_AMBULATORY_CARE_PROVIDER_SITE_OTHER): Payer: BC Managed Care – PPO | Admitting: Family Medicine

## 2021-02-14 ENCOUNTER — Other Ambulatory Visit: Payer: Self-pay

## 2021-02-14 VITALS — BP 117/94 | HR 82 | Ht 62.0 in | Wt 134.2 lb

## 2021-02-14 DIAGNOSIS — Z09 Encounter for follow-up examination after completed treatment for conditions other than malignant neoplasm: Secondary | ICD-10-CM | POA: Diagnosis not present

## 2021-02-14 DIAGNOSIS — K089 Disorder of teeth and supporting structures, unspecified: Secondary | ICD-10-CM

## 2021-02-14 DIAGNOSIS — I639 Cerebral infarction, unspecified: Secondary | ICD-10-CM | POA: Diagnosis not present

## 2021-02-14 DIAGNOSIS — F17200 Nicotine dependence, unspecified, uncomplicated: Secondary | ICD-10-CM

## 2021-02-14 MED ORDER — ASPIRIN 325 MG PO TABS: 325.0000 mg | ORAL_TABLET | Freq: Every day | ORAL | 0 refills | Status: AC

## 2021-02-14 MED ORDER — ATORVASTATIN CALCIUM 80 MG PO TABS: 80.0000 mg | ORAL_TABLET | Freq: Every day | ORAL | 0 refills | Status: DC

## 2021-02-14 NOTE — Assessment & Plan Note (Signed)
Congratulated patient on cutting back and encouraged to continue cutting back.  Currently 3 cigarettes a day using nicotine patch.

## 2021-02-14 NOTE — Progress Notes (Signed)
    SUBJECTIVE:   CHIEF COMPLAINT / HPI: Hospital follow-up  Patient was admitted to the hospital for subacute right precentral gyrus CVA from 8/5-8/6.  Started on DAPT with aspirin and clopidogrel for 3 months.  Myasthenia gravis labs were obtained due to concern for bilateral ptosis.  Acetylcholine receptor antibodies and MuSK antibodies negative, LR P4 antibody still pending at this time.  Discussed these lab results with patient. PCP Recommendations: Continue to encourage alcohol and tobacco cessation Patient is to take aspirin 325 mg along with Plavix 75 mg daily for 3 months.  Further recommendations will be decided at patient's neurology outpatient follow-up visit. Atorvastatin increased from 40 mg to 80 mg on d/c. Consider repeat lipid panel in 4-6 weeks. Should follow up with Neurology outpatient Follow up on Myasthenia Gravis labs that were pending at time of discharge Recommended for EMG/NCS with rep stim to evaluate for potential Myasthenia outpatient  Patient brings in her medications and she is on Plavix with low-dose aspirin 81 mg rather than high-dose aspirin 325 mg.  She also states she never got the prescription for atorvastatin.  She does not have scheduled follow-up with neurology at this time.  She states she does not have any residual weakness and feels ready to go back to work tomorrow.  She is requesting a note stating that she can return to work tomorrow and also has FMLA paperwork to be filled out.  She works at [employer].  Has cut back to 3 cigarettes a day. Using nicotine patch which helps. Drinking 1.5 beers a day but not every day.  PERTINENT  PMH / PSH: CVA, tobacco use, alcohol abuse  OBJECTIVE:   BP (!) 117/94   Pulse 82   Ht 5\' 2"  (1.575 m)   Wt 134 lb 3.2 oz (60.9 kg)   SpO2 100%   BMI 24.55 kg/m   General: Middle-aged female appears older than stated age, NAD Eyes: PERRL, EOMI HEENT: MMM, poor dentition with multiple teeth missing CV: RRR, no  murmurs Pulm: CTAB, no wheezes or rales Neuro: CN II through XII intact, full strength in all extremities  ASSESSMENT/PLAN:   Ischemic stroke [facility]) Doing well post hospital discharge, no residual neurological deficits.  FMLA paperwork filled out and note written to return back to work tomorrow.  Prescription sent in for high-dose aspirin, she is to continue DAPT with high-dose aspirin for at least 3 months.  [facility] neurology office contact information given.  Atorvastatin refilled.  Tobacco use disorder Congratulated patient on cutting back and encouraged to continue cutting back.  Currently 3 cigarettes a day using nicotine patch.  Poor dentition Instructed patient to call insurance company to find out which dentists are covered.   New patient paperwork dropped off at front desk just prior to visit.  [facility], MD [facility]

## 2021-02-14 NOTE — Assessment & Plan Note (Signed)
Doing well post hospital discharge, no residual neurological deficits.  FMLA paperwork filled out and note written to return back to work tomorrow.  Prescription sent in for high-dose aspirin, she is to continue DAPT with high-dose aspirin for at least 3 months.  [facility] neurology office contact information given.  Atorvastatin refilled.

## 2021-02-14 NOTE — Patient Instructions (Addendum)
It was nice seeing you today!  Take the higher dose aspirin 325 mg and stop the lower dose aspirin 81 mg. Take the aspirin and Plavix for at least 3 months until told to stop by the neurologist.  Call [facility] regarding follow-up appointment at [phone].  [facility] [phone] [place], [place] [address]  Great job cutting back on smoking and alcohol! Keep up the good work.  Call your insurance to see which dentists are covered.  Please arrive at least 15 minutes prior to your scheduled appointments.  Stay well, Diana Deeds, MD [facility] [phone]

## 2021-02-14 NOTE — Assessment & Plan Note (Signed)
Instructed patient to call insurance company to find out which dentists are covered.

## 2021-02-17 LAB — MISC LABCORP TEST (SEND OUT): Labcorp test code: 9985

## 2021-05-04 ENCOUNTER — Encounter: Payer: Self-pay | Admitting: Neurology

## 2021-05-04 ENCOUNTER — Ambulatory Visit (INDEPENDENT_AMBULATORY_CARE_PROVIDER_SITE_OTHER): Payer: BC Managed Care – PPO | Admitting: Neurology

## 2021-05-04 VITALS — BP 127/82 | HR 82 | Ht 62.0 in | Wt 130.0 lb

## 2021-05-04 DIAGNOSIS — I669 Occlusion and stenosis of unspecified cerebral artery: Secondary | ICD-10-CM

## 2021-05-04 DIAGNOSIS — I63131 Cerebral infarction due to embolism of right carotid artery: Secondary | ICD-10-CM | POA: Diagnosis not present

## 2021-05-04 MED ORDER — ATORVASTATIN CALCIUM 80 MG PO TABS: 80.0000 mg | ORAL_TABLET | Freq: Every day | ORAL | 0 refills | Status: DC

## 2021-05-04 NOTE — Progress Notes (Signed)
[facility] [address] [place]. [place] [address] [phone]       OFFICE FOLLOW-UP NOTE  Diana Harris Date of Birth:  July 06, 1965 Medical Record Number:  [id number]   HPI: Diana Harris is a 55 year old African-American lady seen today for initial office follow-up visit following hospital consultation for stroke in August 2022.  History is obtained from the patient and review of electronic medical records and opossum reviewed available pertinent imaging films in PACS. 55 year old female with history of smoker, cocaine abuse, and stroke in 08/2017 admitted for slurred speech, left arm weakness and numbness for 3 days on 02/04/2021.  CT showed right frontal infarct.  CT head and neck right ICA ulcerated plaque and right M2 stenosis.  MRI showed right MCA acute and subacute scattered infarcts, as well as old left pontine and right PCA infarct.  EF 70 to 75%, A1c 4.7, LDL 74.  UDS again positive for cocaine.  On exam she had only slight clumsiness of the left hand and diminished fine motor skills.  Etiology of stroke was felt to be due to large vessel intracranial atherosclerosis due to right ICA ulcerated plaque along with right M2 stenosis in the setting of cocaine use.  She was recommended to take aspirin 325 mg and Plavix 75 mg daily for 3 months followed by Plavix alone.  Patient's daughter states that she ran out of her Plavix and cannot afford it but has been taking only aspirin.  She has been taking Lipitor but needs refill.  She states she is cut back her smoking and cocaine but has not quit completely but plans to do so.  She has no residual deficits and is made full recovery.  She has no complaints today.  In February 2019 she was admitted with headache, left-sided visual disturbance and at that time MRI had shown right occipital PCA infarct likely due to cocaine use.  She did well from that stroke and need good recovery.   ROS:   14 system review of systems is positive  for no complaints today and all systems negative  PMH:  Past Medical History:  Diagnosis Date   Ischemic stroke (HCC) 08/22/2017   Right posterior occipital ischemic stroke/notes 08/22/2017   Tobacco abuse     Social History:  Social History   Socioeconomic History   Marital status: Single    Spouse name: Not on file   Number of children: Not on file   Years of education: Not on file   Highest education level: Not on file  Occupational History   Not on file  Tobacco Use   Smoking status: Every Day    Packs/day: 1.00    Years: 40.00    Pack years: 40.00    Types: Cigarettes   Smokeless tobacco: Never   Tobacco comments:    trying  Vaping Use   Vaping Use: Never used  Substance and Sexual Activity   Alcohol use: Yes    Alcohol/week: 6.0 standard drinks    Types: 6 Cans of beer per week   Drug use: No   Sexual activity: Not on file  Other Topics Concern   Not on file  Social History Narrative   Not on file   Social Determinants of Health   Financial Resource Strain: Not on file  Food Insecurity: Not on file  Transportation Needs: Not on file  Physical Activity: Not on file  Stress: Not on file  Social Connections: Not on file  Intimate Partner Violence:  Not on file    Medications:   Current Outpatient Medications on File Prior to Visit  Medication Sig Dispense Refill   aspirin (BAYER ASPIRIN) 325 MG tablet Take 1 tablet (325 mg total) by mouth daily. 90 tablet 0   nicotine polacrilex (NICORETTE) 2 MG gum Take 1 each (2 mg total) by mouth See admin instructions. Chew 1 piece of gum (2mg ) every 1-2 hours while awake. Do not exceed 24 pieces of gum (48mg ) in a day. 150 each 1   No current facility-administered medications on file prior to visit.    Allergies:  No Known Allergies  Physical Exam General: well developed, well nourished, middle-aged African-American lady seated, in no evident distress Head: head normocephalic and atraumatic.  Neck: supple with  no carotid or supraclavicular bruits Cardiovascular: regular rate and rhythm, no murmurs Musculoskeletal: no deformity Skin:  no rash/petichiae Vascular:  Normal pulses all extremities Vitals:   05/04/21 0831  BP: 127/82  Pulse: 82   Neurologic Exam Mental Status: Awake and fully alert. Oriented to place and time. Recent and remote memory intact. Attention span, concentration and fund of knowledge appropriate. Mood and affect appropriate.  Cranial Nerves: Fundoscopic exam reveals sharp disc margins. Pupils equal, briskly reactive to light. Extraocular movements full without nystagmus. Visual fields full to confrontation. Hearing intact. Facial sensation intact. Face, tongue, palate moves normally and symmetrically.  Motor: Normal bulk and tone. Normal strength in all tested extremity muscles.  Diminished fine finger movements on the left.  Orbits right over left upper extremity. Sensory.: intact to touch ,pinprick .position and vibratory sensation.  Coordination: Rapid alternating movements normal in all extremities. Finger-to-nose and heel-to-shin performed accurately bilaterally. Gait and Station: Arises from chair without difficulty. Stance is normal. Gait demonstrates normal stride length and balance . Able to heel, toe and tandem walk without difficulty.  Reflexes: 1+ and symmetric. Toes downgoing.   NIHSS  0 Modified Rankin  1   ASSESSMENT: 55 year old African-American lady with right frontal MCA branch infarct and August 2022 secondary to intracranial right MCA stenosis with multiple vascular risk factors of Tobacco and cocaine abuse, prior stroke, borderline hyperlipidemia.  Patient is doing well with almost complete recovery with no residual deficits    PLAN:I had a long d/w patient about her recent stroke,intracranial stenosis, risk for recurrent stroke/TIAs, personally independently reviewed imaging studies and stroke evaluation results and answered questions.Continue aspirin  325 mg daily  for secondary stroke prevention  and stop Plavix now and maintain strict control of hypertension with blood pressure goal below 130/90, diabetes with hemoglobin A1c goal below 6.5% and lipids with LDL cholesterol goal below 70 mg/dL. I also advised the patient to eat a healthy diet with plenty of whole grains, cereals, fruits and vegetables, exercise regularly and maintain ideal body weight she was advised to quit smoking cigarettes as well as cocaine and states she will do so.  Followup in the future with my nurse practitioner Janett Billow in 3 months or call earlier if necessary.Greater than 50% of time during this 35 minute visit was spent on counseling,explanation of diagnosis, planning of further management, discussion with patient and family and coordination of care Antony Contras, MD Note: This document was prepared with digital dictation and possible smart phrase technology. Any transcriptional errors that result from this process are unintentional

## 2021-05-04 NOTE — Patient Instructions (Signed)
I had a long d/w patient about her recent stroke,intracranial stenosis, risk for recurrent stroke/TIAs, personally independently reviewed imaging studies and stroke evaluation results and answered questions.Continue aspirin 325 mg daily  for secondary stroke prevention  and stop Plavix now and maintain strict control of hypertension with blood pressure goal below 130/90, diabetes with hemoglobin A1c goal below 6.5% and lipids with LDL cholesterol goal below 70 mg/dL. I also advised the patient to eat a healthy diet with plenty of whole grains, cereals, fruits and vegetables, exercise regularly and maintain ideal body weight she was advised to quit smoking cigarettes as well as cocaine and states she will do so.  Followup in the future with my nurse practitioner Shanda Bumps in 3 months or call earlier if necessary. Stroke Prevention Some medical conditions and behaviors are associated with a higher chance of having a stroke. You can help prevent a stroke by making nutrition, lifestyle, and other changes, including managing any medical conditions you may have. What nutrition changes can be made?  Eat healthy foods. You can do this by: Choosing foods high in fiber, such as fresh fruits and vegetables and whole grains. Eating at least 5 or more servings of fruits and vegetables a day. Try to fill half of your plate at each meal with fruits and vegetables. Choosing lean protein foods, such as lean cuts of meat, poultry without skin, fish, tofu, beans, and nuts. Eating low-fat dairy products. Avoiding foods that are high in salt (sodium). This can help lower blood pressure. Avoiding foods that have saturated fat, trans fat, and cholesterol. This can help prevent high cholesterol. Avoiding processed and premade foods. Follow your health care provider's specific guidelines for losing weight, controlling high blood pressure (hypertension), lowering high cholesterol, and managing diabetes. These may  include: Reducing your daily calorie intake. Limiting your daily sodium intake to 1,500 milligrams (mg). Using only healthy fats for cooking, such as olive oil, canola oil, or sunflower oil. Counting your daily carbohydrate intake. What lifestyle changes can be made? Maintain a healthy weight. Talk to your health care provider about your ideal weight. Get at least 30 minutes of moderate physical activity at least 5 days a week. Moderate activity includes brisk walking, biking, and swimming. Do not use any products that contain nicotine or tobacco, such as cigarettes and e-cigarettes. If you need help quitting, ask your health care provider. It may also be helpful to avoid exposure to secondhand smoke. Limit alcohol intake to no more than 1 drink a day for nonpregnant women and 2 drinks a day for men. One drink equals 12 oz of beer, 5 oz of wine, or 1 oz of hard liquor. Stop any illegal drug use. Avoid taking birth control pills. Talk to your health care provider about the risks of taking birth control pills if: You are over 12 years old. You smoke. You get migraines. You have ever had a blood clot. What other changes can be made? Manage your cholesterol levels. Eating a healthy diet is important for preventing high cholesterol. If cholesterol cannot be managed through diet alone, you may also need to take medicines. Take any prescribed medicines to control your cholesterol as told by your health care provider. Manage your diabetes. Eating a healthy diet and exercising regularly are important parts of managing your blood sugar. If your blood sugar cannot be managed through diet and exercise, you may need to take medicines. Take any prescribed medicines to control your diabetes as told by your health care provider.  Control your hypertension. To reduce your risk of stroke, try to keep your blood pressure below 130/80. Eating a healthy diet and exercising regularly are an important part of  controlling your blood pressure. If your blood pressure cannot be managed through diet and exercise, you may need to take medicines. Take any prescribed medicines to control hypertension as told by your health care provider. Ask your health care provider if you should monitor your blood pressure at home. Have your blood pressure checked every year, even if your blood pressure is normal. Blood pressure increases with age and some medical conditions. Get evaluated for sleep disorders (sleep apnea). Talk to your health care provider about getting a sleep evaluation if you snore a lot or have excessive sleepiness. Take over-the-counter and prescription medicines only as told by your health care provider. Aspirin or blood thinners (antiplatelets or anticoagulants) may be recommended to reduce your risk of forming blood clots that can lead to stroke. Make sure that any other medical conditions you have, such as atrial fibrillation or atherosclerosis, are managed. What are the warning signs of a stroke? The warning signs of a stroke can be easily remembered as BEFAST. B is for balance. Signs include: Dizziness. Loss of balance or coordination. Sudden trouble walking. E is for eyes. Signs include: A sudden change in vision. Trouble seeing. F is for face. Signs include: Sudden weakness or numbness of the face. The face or eyelid drooping to one side. A is for arms. Signs include: Sudden weakness or numbness of the arm, usually on one side of the body. S is for speech. Signs include: Trouble speaking (aphasia). Trouble understanding. T is for time. These symptoms may represent a serious problem that is an emergency. Do not wait to see if the symptoms will go away. Get medical help right away. Call your local emergency services (911 in the [place] Do not drive yourself to the hospital. Other signs of stroke may include: A sudden, severe headache with no known cause. Nausea or  vomiting. Seizure. Where to find more information For more information, visit: American Stroke Association: www.strokeassociation.org National Stroke Association: www.stroke.org Summary You can prevent a stroke by eating healthy, exercising, not smoking, limiting alcohol intake, and managing any medical conditions you may have. Do not use any products that contain nicotine or tobacco, such as cigarettes and e-cigarettes. If you need help quitting, ask your health care provider. It may also be helpful to avoid exposure to secondhand smoke. Remember BEFAST for warning signs of stroke. Get help right away if you or a loved one has any of these signs. This information is not intended to replace advice given to you by your health care provider. Make sure you discuss any questions you have with your health care provider. Document Revised: 06/01/2017 Document Reviewed: 07/25/2016 Elsevier Patient Education  2021 [employer].

## 2022-02-10 ENCOUNTER — Emergency Department (HOSPITAL_COMMUNITY)
Admission: EM | Admit: 2022-02-10 | Discharge: 2022-02-10 | Disposition: A | Discharge status: Home / Self Care | Payer: BC Managed Care – PPO | Attending: Emergency Medicine | Admitting: Emergency Medicine

## 2022-02-10 ENCOUNTER — Emergency Department (HOSPITAL_COMMUNITY): Payer: BC Managed Care – PPO

## 2022-02-10 ENCOUNTER — Other Ambulatory Visit: Payer: Self-pay

## 2022-02-10 ENCOUNTER — Encounter (HOSPITAL_COMMUNITY): Payer: Self-pay

## 2022-02-10 DIAGNOSIS — K700 Alcoholic fatty liver: Secondary | ICD-10-CM | POA: Diagnosis not present

## 2022-02-10 DIAGNOSIS — K760 Fatty (change of) liver, not elsewhere classified: Secondary | ICD-10-CM | POA: Diagnosis not present

## 2022-02-10 DIAGNOSIS — E860 Dehydration: Secondary | ICD-10-CM | POA: Diagnosis not present

## 2022-02-10 DIAGNOSIS — R55 Syncope and collapse: Secondary | ICD-10-CM | POA: Diagnosis not present

## 2022-02-10 DIAGNOSIS — Z87891 Personal history of nicotine dependence: Secondary | ICD-10-CM | POA: Insufficient documentation

## 2022-02-10 DIAGNOSIS — I951 Orthostatic hypotension: Secondary | ICD-10-CM | POA: Diagnosis not present

## 2022-02-10 DIAGNOSIS — R4182 Altered mental status, unspecified: Secondary | ICD-10-CM | POA: Diagnosis not present

## 2022-02-10 DIAGNOSIS — R7989 Other specified abnormal findings of blood chemistry: Secondary | ICD-10-CM

## 2022-02-10 DIAGNOSIS — R160 Hepatomegaly, not elsewhere classified: Secondary | ICD-10-CM | POA: Diagnosis not present

## 2022-02-10 DIAGNOSIS — R42 Dizziness and giddiness: Secondary | ICD-10-CM | POA: Diagnosis not present

## 2022-02-10 DIAGNOSIS — R7401 Elevation of levels of liver transaminase levels: Secondary | ICD-10-CM | POA: Diagnosis not present

## 2022-02-10 LAB — CBG MONITORING, ED: Glucose-Capillary: 118 mg/dL — ABNORMAL HIGH (ref 70–99)

## 2022-02-10 LAB — URINALYSIS, ROUTINE W REFLEX MICROSCOPIC
Bilirubin Urine: NEGATIVE
Glucose, UA: NEGATIVE mg/dL
Hgb urine dipstick: NEGATIVE
Ketones, ur: NEGATIVE mg/dL
Nitrite: NEGATIVE
Protein, ur: 30 mg/dL — AB
Specific Gravity, Urine: 1.024 (ref 1.005–1.030)
pH: 5 (ref 5.0–8.0)

## 2022-02-10 LAB — CBC WITH DIFFERENTIAL/PLATELET
Abs Immature Granulocytes: 0.04 10*3/uL (ref 0.00–0.07)
Basophils Absolute: 0 10*3/uL (ref 0.0–0.1)
Basophils Relative: 0 %
Eosinophils Absolute: 0.1 10*3/uL (ref 0.0–0.5)
Eosinophils Relative: 2 %
HCT: 46.8 % — ABNORMAL HIGH (ref 36.0–46.0)
Hemoglobin: 16.3 g/dL — ABNORMAL HIGH (ref 12.0–15.0)
Immature Granulocytes: 1 %
Lymphocytes Relative: 19 %
Lymphs Abs: 1.4 10*3/uL (ref 0.7–4.0)
MCH: 32.5 pg (ref 26.0–34.0)
MCHC: 34.8 g/dL (ref 30.0–36.0)
MCV: 93.2 fL (ref 80.0–100.0)
Monocytes Absolute: 0.6 10*3/uL (ref 0.1–1.0)
Monocytes Relative: 8 %
Neutro Abs: 5.3 10*3/uL (ref 1.7–7.7)
Neutrophils Relative %: 70 %
Platelets: 201 10*3/uL (ref 150–400)
RBC: 5.02 MIL/uL (ref 3.87–5.11)
RDW: 13.3 % (ref 11.5–15.5)
WBC: 7.5 10*3/uL (ref 4.0–10.5)
nRBC: 0 % (ref 0.0–0.2)

## 2022-02-10 LAB — PROTIME-INR
INR: 1 (ref 0.8–1.2)
Prothrombin Time: 13.4 seconds (ref 11.4–15.2)

## 2022-02-10 LAB — COMPREHENSIVE METABOLIC PANEL
ALT: 119 U/L — ABNORMAL HIGH (ref 0–44)
AST: 100 U/L — ABNORMAL HIGH (ref 15–41)
Albumin: 4.1 g/dL (ref 3.5–5.0)
Alkaline Phosphatase: 72 U/L (ref 38–126)
Anion gap: 11 (ref 5–15)
BUN: 11 mg/dL (ref 6–20)
CO2: 22 mmol/L (ref 22–32)
Calcium: 9.4 mg/dL (ref 8.9–10.3)
Chloride: 106 mmol/L (ref 98–111)
Creatinine, Ser: 0.82 mg/dL (ref 0.44–1.00)
GFR, Estimated: >60 mL/min (ref >60)
Glucose, Bld: 100 mg/dL — ABNORMAL HIGH (ref 70–99)
Potassium: 3.7 mmol/L (ref 3.5–5.1)
Sodium: 139 mmol/L (ref 135–145)
Total Bilirubin: 0.7 mg/dL (ref 0.3–1.2)
Total Protein: 7.7 g/dL (ref 6.5–8.1)

## 2022-02-10 LAB — RAPID URINE DRUG SCREEN, HOSP PERFORMED
Amphetamines: NOT DETECTED
Barbiturates: NOT DETECTED
Benzodiazepines: NOT DETECTED
Cocaine: POSITIVE — AB
Opiates: NOT DETECTED
Tetrahydrocannabinol: POSITIVE — AB

## 2022-02-10 LAB — MAGNESIUM: Magnesium: 2.1 mg/dL (ref 1.7–2.4)

## 2022-02-10 LAB — TROPONIN I (HIGH SENSITIVITY): Troponin I (High Sensitivity): 7 ng/L (ref <18)

## 2022-02-10 LAB — ETHANOL: Alcohol, Ethyl (B): <10 mg/dL (ref <10)

## 2022-02-10 LAB — AMMONIA: Ammonia: 39 umol/L — ABNORMAL HIGH (ref 9–35)

## 2022-02-10 MED ORDER — LACTATED RINGERS IV BOLUS
1000.0000 mL | Freq: Once | INTRAVENOUS | Status: AC
  Administered 2022-02-10: 1000 mL via INTRAVENOUS

## 2022-02-10 NOTE — ED Provider Notes (Signed)
[facility] EMERGENCY DEPARTMENT Provider Note   CSN: [id number] Arrival date & time: 02/10/22  1130     History  Chief Complaint  Patient presents with   Loss of Consciousness    Diana Harris is a 56 y.o. female.  56 year old female with a history of stroke, cocaine abuse, tobacco use, and alcohol abuse who presents to the emergency department with presyncope.  Patient states that today she was at work at [employer] says that it was very warm in the room she was working and she started feeling hot.  Says that a coworker started fanning her after she started sweating and she sat down.  Says that she put her head on the table but did not pass out but did feel very lightheaded.  Says that she also felt nauseous at that time.  Denies any chest pain.  Says that she has some discomfort in her back on the right side at this time.  Told triage that she was having urinary symptoms but denies dysuria and frequency to me at this time.  No recent illnesses or fevers, cough, runny nose, sore throat, chest pain, numbness or weakness.  Says that she is having occasional foot pains bilaterally that are fleeting.  Says that she still drinks alcohol approximately a sixpack a day.  Unsure last cocaine use.   Loss of Consciousness      Home Medications Prior to Admission medications   Medication Sig Start Date End Date Taking? Authorizing Provider  atorvastatin (LIPITOR) 80 MG tablet Take 1 tablet (80 mg total) by mouth daily. 05/04/21   Micki Riley, MD  nicotine polacrilex (NICORETTE) 2 MG gum Take 1 each (2 mg total) by mouth See admin instructions. Chew 1 piece of gum (2mg ) every 1-2 hours while awake. Do not exceed 24 pieces of gum (48mg ) in a day. 02/05/21   Simmons-Robinson, , MD      Allergies    Patient has no known allergies.    Review of Systems   Review of Systems  Cardiovascular:  Positive for syncope.    Physical Exam Updated Vital Signs BP 118/87    Pulse 72   Temp (!) 97.5 F (36.4 C) (Oral)   Resp 16   Ht 5\' 2"  (1.575 m)   Wt 59 kg   SpO2 97%   BMI 23.78 kg/m  Physical Exam Vitals and nursing note reviewed.  Constitutional:      General: She is not in acute distress.    Appearance: She is well-developed.  HENT:     Head: Normocephalic and atraumatic.     Right Ear: External ear normal.     Left Ear: External ear normal.     Nose: Nose normal.  Eyes:     Extraocular Movements: Extraocular movements intact.     Conjunctiva/sclera: Conjunctivae normal.     Pupils: Pupils are equal, round, and reactive to light.  Cardiovascular:     Rate and Rhythm: Normal rate and regular rhythm.     Heart sounds: No murmur heard. Pulmonary:     Effort: Pulmonary effort is normal. No respiratory distress.     Breath sounds: Normal breath sounds.  Abdominal:     General: Abdomen is flat. There is no distension.     Palpations: Abdomen is soft. There is mass (Right upper and right lower quadrant).     Tenderness: There is no abdominal tenderness. There is no guarding.  Musculoskeletal:  General: No swelling.     Cervical back: Normal range of motion and neck supple.     Right lower leg: No edema.     Left lower leg: No edema.  Skin:    General: Skin is warm and dry.     Capillary Refill: Capillary refill takes less than 2 seconds.  Neurological:     Mental Status: She is alert.     Comments: MENTAL STATUS: AAOx3 CRANIAL NERVES: II: Pupils equal and reactive 3 mm BL, no RAPD, no VF deficits III, IV, VI: EOM intact, no gaze preference or deviation, no nystagmus. V: normal sensation to light touch in V1, V2, and V3 segments bilaterally VII: Ptosis noted at baseline. No facial weakness or asymmetry, no nasolabial fold flattening VIII: normal hearing to speech and finger friction IX, X: normal palatal elevation, no uvular deviation XI: 5/5 head turn and 5/5 shoulder shrug bilaterally XII: midline tongue protrusion MOTOR: 5/5  strength in R shoulder flexion, elbow flexion and extension, and grip strength. 5/5 strength in L shoulder flexion, elbow flexion and extension, and grip strength.  5/5 strength in R hip and knee flexion, knee extension, ankle plantar and dorsiflexion. 5/5 strength in L hip and knee flexion, knee extension, ankle plantar and dorsiflexion. SENSORY: Normal sensation to light touch in all extremities COORD: Normal finger to nose and heel to shin, no tremor, no dysmetria STATION: normal stance, no truncal ataxia GAIT: Normal  Psychiatric:        Mood and Affect: Mood normal.     ED Results / Procedures / Treatments   Labs (all labs ordered are listed, but only abnormal results are displayed) Labs Reviewed  CBC WITH DIFFERENTIAL/PLATELET - Abnormal; Notable for the following components:      Result Value   Hemoglobin 16.3 (*)    HCT 46.8 (*)    All other components within normal limits  COMPREHENSIVE METABOLIC PANEL - Abnormal; Notable for the following components:   Glucose, Bld 100 (*)    AST 100 (*)    ALT 119 (*)    All other components within normal limits  URINALYSIS, ROUTINE W REFLEX MICROSCOPIC - Abnormal; Notable for the following components:   Color, Urine AMBER (*)    APPearance HAZY (*)    Protein, ur 30 (*)    Leukocytes,Ua TRACE (*)    Bacteria, UA RARE (*)    All other components within normal limits  RAPID URINE DRUG SCREEN, HOSP PERFORMED - Abnormal; Notable for the following components:   Cocaine POSITIVE (*)    Tetrahydrocannabinol POSITIVE (*)    All other components within normal limits  AMMONIA - Abnormal; Notable for the following components:   Ammonia 39 (*)    All other components within normal limits  CBG MONITORING, ED - Abnormal; Notable for the following components:   Glucose-Capillary 118 (*)    All other components within normal limits  MAGNESIUM  ETHANOL  PROTIME-INR  TROPONIN I (HIGH SENSITIVITY)    EKG EKG  Interpretation  Date/Time:  Friday February 10 2022 11:43:08 EDT Ventricular Rate:  71 PR Interval:  151 QRS Duration: 80 QT Interval:  407 QTC Calculation: 443 R Axis:   57 Text Interpretation: Sinus rhythm Anterior infarct, old as evidenced by PRWP When compared to 11/07/20 No significant changes Confirmed by Vonita Moss [phone]) on 02/10/2022 11:52:48 AM  Radiology CT HEAD WO CONTRAST  Result Date: 02/10/2022 CLINICAL DATA:  Altered mental status, dizziness, unresponsive EXAM: CT HEAD WITHOUT CONTRAST TECHNIQUE:  Contiguous axial images were obtained from the base of the skull through the vertex without intravenous contrast. RADIATION DOSE REDUCTION: This exam was performed according to the departmental dose-optimization program which includes automated exposure control, adjustment of the mA and/or kV according to patient size and/or use of iterative reconstruction technique. COMPARISON:  02/04/2021 FINDINGS: Brain: Remote areas of infarct in the right frontal lobe, right occipital lobe, and left basal ganglia along the anterior limb of the internal capsule. No acute intracranial hemorrhage, new mass lesion, definite new infarction, midline shift, herniation, hydrocephalus, or extra-axial fluid collection. No focal mass effect or edema. Cisterns are patent. No cerebellar abnormality. Small remote left pontine infarct again noted. Vascular: No hyperdense vessel or unexpected calcification. Skull: Normal. Negative for fracture or focal lesion. Sinuses/Orbits: No acute finding. Other: None. IMPRESSION: Areas of remote infarct as above. No acute intracranial abnormality by noncontrast CT. Electronically Signed   By: Judie Petit.  Shick M.D.   On: 02/10/2022 13:02   US Abdomen Limited RUQ (LIVER/GB)  Result Date: 02/10/2022 CLINICAL DATA:  Enlarged liver EXAM: ULTRASOUND ABDOMEN LIMITED RIGHT UPPER QUADRANT COMPARISON:  None Available. FINDINGS: Gallbladder: No gallstones or wall thickening visualized. No  sonographic Murphy sign noted by sonographer. Common bile duct: Diameter: 2.5 mm Liver: There is increased echogenicity in the liver suggesting fatty infiltration. No focal abnormalities are seen. Portal vein is patent on color Doppler imaging with normal direction of blood flow towards the liver. Other: None. IMPRESSION: Fatty liver. No other sonographic abnormalities are seen in right upper quadrant. Electronically Signed   By: Ernie Avena M.D.   On: 02/10/2022 12:55   DG Chest 1 View  Result Date: 02/10/2022 CLINICAL DATA:  Syncope EXAM: CHEST  1 VIEW COMPARISON:  06/05/2020 FINDINGS: Cardiac and mediastinal contours are within normal limits. No focal pulmonary opacity. No pleural effusion or pneumothorax. No acute osseous abnormality. IMPRESSION: No acute cardiopulmonary process. Electronically Signed   By: Wiliam Ke M.D.   On: 02/10/2022 12:04    Procedures Procedures   Medications Ordered in ED Medications  lactated ringers bolus 1,000 mL (0 mLs Intravenous Stopped 02/10/22 1458)    ED Course/ Medical Decision Making/ A&P Clinical Course as of 02/10/22 1809  Fri Feb 10, 2022  1358 Patient had positive orthostatics.  Feeling better after receiving some fluids.  Was able to walk to the restroom without assistance. [RP]  1433 Discussed urinalysis results with the patient.  She insists that she does not have any symptoms at this time so will not treat her for UTI.  Also discussed hepatomegaly, fatty liver, and abnormal LFTs with the patient which is likely due to her alcohol use.  We will have her follow-up with this and her other symptoms with her primary care doctor.  Return precautions discussed with the patient prior to discharge. [RP]    Clinical Course User Index [RP] Rondel Baton, MD                           Medical Decision Making 56 year old female with a history of stroke, cocaine abuse, tobacco use, and alcohol abuse who presents to the emergency department  with presyncope.  Initial DDx: Dehydration, orthostasis, alcohol intoxication, vasovagal reaction, cardiogenic syncope, dizziness due to neurologic cause Given the patient's reports of profuse sweating and working in a hot area felt that her symptoms were likely due to dehydration.  No intense emotional trigger that would suggest a vagal cause.  No additional  cardio pulmonary symptoms or complete loss of consciousness that would suggest cardiogenic syncope.  No focal neurologic deficits or other symptoms that would be suggestive of neurologic cause of her presentation.   Plan:  Fluids EKG CXR Labs Head CT  ED Summary:  After the above work-up patient had a reassuring EKG and initial troponin.  She had orthostatics performed that were positive and was feeling much better after 1 L of fluids.  She was able to ambulate in the emergency department without difficulty.  She was noted to have hepatomegaly on exam so right upper quadrant ultrasound was obtained that did show fatty liver and her LFTs were mildly elevated.  She was informed of these findings and the fact that she will need to follow-up with her primary care doctor about this.  Did obtain a head CT from triage as well which was unremarkable.  Patient was discharged with instructions to stay well-hydrated and to follow-up with her primary doctor about her liver abnormalities and ultrasound findings.  Return precautions discussed with patient prior to discharge.  Records reviewed Care Everywhere/External Records  The following labs were independently interpreted: Elevated hemoglobin suggestive of hemoconcentration and dehydration  I independently visualized the following imaging with scope of interpretation limited to determining acute life threatening conditions related to emergency care: Head CT, which revealed no acute findings  Consults: - none  Social Determinants of health:  ETOH Abuse  Amount and/or Complexity of Data  Reviewed Labs: ordered. Radiology: ordered. ECG/medicine tests: ordered.   Final Clinical Impression(s) / ED Diagnoses Final diagnoses:  Fatty liver  Near syncope  Orthostasis  Dehydration  Elevated LFTs    Rx / DC Orders ED Discharge Orders     None         Rondel Baton, MD 02/10/22 [phone]

## 2022-02-10 NOTE — ED Notes (Signed)
Pt ambulatory to and from restroom with steady gait 

## 2022-02-10 NOTE — ED Notes (Addendum)
error 

## 2022-02-10 NOTE — ED Notes (Signed)
Patient verbalizes understanding of discharge instructions. Opportunity for questioning and answers were provided. Pt discharged from ED. 

## 2022-02-10 NOTE — Discharge Instructions (Signed)
Today you were seen in the emergency department for your lightheadedness.    In the emergency department you had an evaluation that was reassuring and were given fluids.  Is likely that your symptoms were from dehydration.    At home, please be sure to stay well-hydrated.    Follow-up with your primary doctor in 2-3 days regarding your visit.  Please talk to them about your fatty liver as well as elevated liver enzymes.  Return immediately to the emergency department if you experience any of the following: Fainting, numbness or weakness of your arms or legs, vision changes, dizziness, slurred speech, or any other concerning symptoms.    Thank you for visiting our Emergency Department. It was a pleasure taking care of you today.

## 2022-02-10 NOTE — ED Triage Notes (Signed)
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

## 2022-02-14 ENCOUNTER — Ambulatory Visit (INDEPENDENT_AMBULATORY_CARE_PROVIDER_SITE_OTHER): Payer: BC Managed Care – PPO | Admitting: Family Medicine

## 2022-02-14 VITALS — BP 118/72 | HR 79 | Ht 62.0 in | Wt 129.2 lb

## 2022-02-14 DIAGNOSIS — M79674 Pain in right toe(s): Secondary | ICD-10-CM | POA: Diagnosis not present

## 2022-02-14 DIAGNOSIS — F1910 Other psychoactive substance abuse, uncomplicated: Secondary | ICD-10-CM

## 2022-02-14 DIAGNOSIS — I639 Cerebral infarction, unspecified: Secondary | ICD-10-CM

## 2022-02-14 DIAGNOSIS — Z09 Encounter for follow-up examination after completed treatment for conditions other than malignant neoplasm: Secondary | ICD-10-CM

## 2022-02-14 MED ORDER — ASPIRIN 325 MG PO TBEC: 325.0000 mg | DELAYED_RELEASE_TABLET | Freq: Every day | ORAL | 1 refills | Status: DC

## 2022-02-14 MED ORDER — ATORVASTATIN CALCIUM 80 MG PO TABS
80.0000 mg | ORAL_TABLET | Freq: Every day | ORAL | 1 refills | Status: DC
Start: 2022-02-14 — End: 2022-05-16

## 2022-02-14 NOTE — Patient Instructions (Addendum)
It was great to meet you!  I'm glad you're feeling better. Be sure to drink plenty of water throughout the day and eat 3 nutritious meals.  I have sent refills on your atorvastatin (lipitor) and aspirin. Take these once daily.  With regard to alcohol: we recommend no more than 1 drink per day or 7 per week.   Avoid all other substances including tobacco, marijuana, cocaine, etc.  For your toe, you can call the podiatry office for an appointment: [facility] [address] [place] [phone]  Take care, Dr Anner Crete

## 2022-02-14 NOTE — Progress Notes (Unsigned)
    SUBJECTIVE:   CHIEF COMPLAINT / HPI:   ED Follow Up Seen in the ED on 8/11 for near-syncopal episode.  Positive orthostatic vitals Normal head CT  R 3rd toe Pain Occasional over past few months No known injury   Noted to have fatty liver  Not taking any meds-- supposed to be on atorvastatin and asa-- does   PERTINENT  PMH / PSH: CVA, polysubstance abuse  OBJECTIVE:   BP 118/72   Pulse 79   Ht 5\' 2"  (1.575 m)   Wt 129 lb 3.2 oz (58.6 kg)   SpO2 97%   BMI 23.63 kg/m   ***  ASSESSMENT/PLAN:   No problem-specific Assessment & Plan notes found for this encounter.     , MD [facility]

## 2022-02-15 DIAGNOSIS — F1910 Other psychoactive substance abuse, uncomplicated: Secondary | ICD-10-CM | POA: Insufficient documentation

## 2022-02-15 NOTE — Assessment & Plan Note (Signed)
H/o ischemic stroke. Supposed to be on atorvastatin 80mg  and ASA 325mg  daily but is not taking either of these. Refills sent and patient counseled.

## 2022-03-02 ENCOUNTER — Ambulatory Visit: Payer: BC Managed Care – PPO | Admitting: Family Medicine

## 2022-03-14 ENCOUNTER — Encounter: Payer: Self-pay | Admitting: Family Medicine

## 2022-03-14 ENCOUNTER — Ambulatory Visit (INDEPENDENT_AMBULATORY_CARE_PROVIDER_SITE_OTHER): Payer: BC Managed Care – PPO | Admitting: Family Medicine

## 2022-03-14 VITALS — BP 137/74 | HR 77 | Wt 134.2 lb

## 2022-03-14 DIAGNOSIS — Z23 Encounter for immunization: Secondary | ICD-10-CM | POA: Diagnosis not present

## 2022-03-14 DIAGNOSIS — Z1231 Encounter for screening mammogram for malignant neoplasm of breast: Secondary | ICD-10-CM

## 2022-03-14 DIAGNOSIS — Z1211 Encounter for screening for malignant neoplasm of colon: Secondary | ICD-10-CM

## 2022-03-14 DIAGNOSIS — Z13220 Encounter for screening for lipoid disorders: Secondary | ICD-10-CM | POA: Diagnosis not present

## 2022-03-14 DIAGNOSIS — F1910 Other psychoactive substance abuse, uncomplicated: Secondary | ICD-10-CM

## 2022-03-14 DIAGNOSIS — Z1159 Encounter for screening for other viral diseases: Secondary | ICD-10-CM | POA: Diagnosis not present

## 2022-03-14 DIAGNOSIS — Z0000 Encounter for general adult medical examination without abnormal findings: Secondary | ICD-10-CM | POA: Diagnosis not present

## 2022-03-14 DIAGNOSIS — I6319 Cerebral infarction due to embolism of other precerebral artery: Secondary | ICD-10-CM

## 2022-03-14 DIAGNOSIS — F17200 Nicotine dependence, unspecified, uncomplicated: Secondary | ICD-10-CM | POA: Diagnosis not present

## 2022-03-14 NOTE — Assessment & Plan Note (Signed)
On statin and ASA, will check lipid panel

## 2022-03-14 NOTE — Progress Notes (Cosign Needed Addendum)
    SUBJECTIVE:   CHIEF COMPLAINT / HPI:  No chief complaint on file.   No concerns today.  Here for checkup. Smoking 8 cigarettes/day. Started smoking at age 56, at most 10-15 cigarettes/day. Drinking less beer, not every day, 1/2 of a 40 oz beer. Former cocaine user, no other recreational drug use currently. Still working at [employer].  PERTINENT  PMH / PSH: CVA, tobacco use  Patient Care Team: Littie Deeds, MD as PCP - General (Family Medicine)   OBJECTIVE:   BP 137/74   Pulse 77   Wt 134 lb 3.2 oz (60.9 kg)   SpO2 98%   BMI 24.55 kg/m   Physical Exam Constitutional:      General: She is not in acute distress. Neck:     Comments: No thyromegaly Cardiovascular:     Rate and Rhythm: Normal rate and regular rhythm.  Pulmonary:     Effort: Pulmonary effort is normal. No respiratory distress.     Breath sounds: Normal breath sounds.  Musculoskeletal:     Cervical back: Neck supple.  Neurological:     Mental Status: She is alert.         {Show previous vital signs (optional):23777}    ASSESSMENT/PLAN:   Encounter for routine history and physical examination of adult  CVA (cerebral vascular accident) (HCC) On statin and ASA, will check lipid panel  Tobacco use disorder 8 cigarettes/day smoker, counseling provided.  Advised to restart nicotine patch.  Declined other medications at this time.   HCM - HCV screening -Colonoscopy referral to GI placed -Low-dose CT annual lung cancer screening ordered (greater than 20 pack year smoker) -Mammogram ordered -Tdap given today -Due for shingles, advised patient to obtain this at pharmacy -Pap smear due, patient will schedule separate appointment for this  Return in about 1 year (around 03/15/2023) for physical.   Littie Deeds, MD [facility]

## 2022-03-14 NOTE — Patient Instructions (Addendum)
It was nice seeing you today!  Get your shingles vaccine at your pharmacy. It is 2 shots, 2-6 months apart.  Referral to GI to discuss colonoscopy for colon cancer screening.  Call to schedule the mammogram.  CT scan to screen for lung cancer.  Try to cut back on smoking and alcohol as much as you can.  Come back for your pap smear at your convenience.  Stay well, Littie Deeds, MD [facility] [phone]  --  Make sure to check out at the front desk before you leave today.  Please arrive at least 15 minutes prior to your scheduled appointments.  If you had blood work today, I will send you a MyChart message or a letter if results are normal. Otherwise, I will give you a call.  If you had a referral placed, they will call you to set up an appointment. Please give [place] a call if you don't hear back in the next 2 weeks.  If you need additional refills before your next appointment, please call your pharmacy first.

## 2022-03-14 NOTE — Assessment & Plan Note (Signed)
8 cigarettes/day smoker, counseling provided.  Advised to restart nicotine patch.  Declined other medications at this time.

## 2022-03-19 LAB — LIPID PANEL
Chol/HDL Ratio: 2 ratio (ref 0.0–4.4)
Cholesterol, Total: 101 mg/dL (ref 100–199)
HDL: 51 mg/dL (ref >39)
LDL Chol Calc (NIH): 32 mg/dL (ref 0–99)
Triglycerides: 91 mg/dL (ref 0–149)
VLDL Cholesterol Cal: 18 mg/dL (ref 5–40)

## 2022-03-19 LAB — HCV RT-PCR, QUANT (NON-GRAPH)
HCV log10: 5.96 log10 IU/mL
Hepatitis C Quantitation: 913000 IU/mL

## 2022-03-19 LAB — HCV AB W REFLEX TO QUANT PCR: HCV Ab: REACTIVE — AB

## 2022-03-24 ENCOUNTER — Telehealth: Payer: Self-pay | Admitting: Family Medicine

## 2022-03-24 ENCOUNTER — Ambulatory Visit (HOSPITAL_COMMUNITY)
Admission: RE | Admit: 2022-03-24 | Discharge: 2022-03-24 | Disposition: A | Discharge status: Home / Self Care | Payer: BC Managed Care – PPO | Source: Ambulatory Visit | Attending: Family Medicine | Admitting: Family Medicine

## 2022-03-24 DIAGNOSIS — F17200 Nicotine dependence, unspecified, uncomplicated: Secondary | ICD-10-CM | POA: Insufficient documentation

## 2022-03-24 DIAGNOSIS — F17210 Nicotine dependence, cigarettes, uncomplicated: Secondary | ICD-10-CM | POA: Diagnosis not present

## 2022-03-24 DIAGNOSIS — B1920 Unspecified viral hepatitis C without hepatic coma: Secondary | ICD-10-CM

## 2022-03-24 NOTE — Telephone Encounter (Signed)
Patient returns call to nurse line. She states that she is at work but is fixing to go on her break. If possible, she is requesting returned phone call between 12 and 1.   Forwarding to PCP.   Talbot Grumbling, RN

## 2022-03-24 NOTE — Telephone Encounter (Signed)
Attempted to call patient regarding lab results. Left message to return call.

## 2022-03-25 DIAGNOSIS — B1920 Unspecified viral hepatitis C without hepatic coma: Secondary | ICD-10-CM | POA: Insufficient documentation

## 2022-03-25 NOTE — Addendum Note (Signed)
Addended by: Zola Button D on: 03/25/2022 10:08 AM   Modules accepted: Orders

## 2022-03-25 NOTE — Telephone Encounter (Signed)
Called patient with lab results. Labs indicate active hepatitis C infection.  She denies history of IV drug use. Still drinking some alcohol. I advised her to stop completely.  Discussed that we need to obtain labs and imaging to determine next steps for treatment.  Lab visit scheduled 9/27. Future labs ordered. [place] RUQ with elastography ordered. Will route to team to schedule.  All questions answered. Will go over results at our upcoming appt 10/3.

## 2022-03-27 ENCOUNTER — Telehealth: Payer: Self-pay

## 2022-03-27 NOTE — Telephone Encounter (Signed)
Received phone call from [place] with call report of patient's CT lung cancer screening. See below.   IMPRESSION: 1. Lung-RADS 2S, benign appearance or behavior. Continue annual screening with low-dose chest CT without contrast in 12 months. 2. The "S" modifier represents a potentially clinically significant non pulmonary finding. Suggestion of left upper quadrant low-density soft tissue fullness, only minimally imaged. Correlate with upper abdominal symptoms and consider further evaluation with contrast-enhanced abdominopelvic CT. 3. Aortic Atherosclerosis (ICD10-I70.0) and Emphysema (ICD10-J43.9). 4. Age advanced coronary artery atherosclerosis. Recommend assessment of coronary risk factors.  Forwarding to PCP.   Talbot Grumbling, RN

## 2022-03-29 ENCOUNTER — Other Ambulatory Visit: Payer: BC Managed Care – PPO

## 2022-03-29 DIAGNOSIS — B1920 Unspecified viral hepatitis C without hepatic coma: Secondary | ICD-10-CM

## 2022-03-31 ENCOUNTER — Telehealth: Payer: Self-pay

## 2022-03-31 NOTE — Telephone Encounter (Signed)
Appt made for 04/07/22 at [facility] at 9:30. Patient is to NPO after midnight. Attempted to reach. No answer. Unable to LVM. Will try later. Salvatore Marvel, CMA

## 2022-03-31 NOTE — Telephone Encounter (Signed)
Attempted to reach patient. No answer. Unable to LVM . Will try later. U/S appt is at [facility] Oct 6th at 9:30. Patient NPO after midnight. Salvatore Marvel, CMA

## 2022-04-02 LAB — CBC
Hematocrit: 38.8 % (ref 34.0–46.6)
Hemoglobin: 13.3 g/dL (ref 11.1–15.9)
MCH: 32.2 pg (ref 26.6–33.0)
MCHC: 34.3 g/dL (ref 31.5–35.7)
MCV: 94 fL (ref 79–97)
Platelets: 189 10*3/uL (ref 150–450)
RBC: 4.13 x10E6/uL (ref 3.77–5.28)
RDW: 11.9 % (ref 11.7–15.4)
WBC: 3.7 10*3/uL (ref 3.4–10.8)

## 2022-04-02 LAB — HEPATIC FUNCTION PANEL
ALT: 91 IU/L — ABNORMAL HIGH (ref 0–32)
AST: 74 IU/L — ABNORMAL HIGH (ref 0–40)
Albumin: 3.6 g/dL — ABNORMAL LOW (ref 3.8–4.9)
Alkaline Phosphatase: 77 IU/L (ref 44–121)
Bilirubin Total: 0.5 mg/dL (ref 0.0–1.2)
Bilirubin, Direct: 0.18 mg/dL (ref 0.00–0.40)
Total Protein: 6.3 g/dL (ref 6.0–8.5)

## 2022-04-02 LAB — HEPATITIS C GENOTYPE

## 2022-04-02 LAB — HEPATITIS A ANTIBODY, TOTAL: hep A Total Ab: NEGATIVE

## 2022-04-02 LAB — BASIC METABOLIC PANEL
BUN/Creatinine Ratio: 15 (ref 9–23)
BUN: 11 mg/dL (ref 6–24)
CO2: 24 mmol/L (ref 20–29)
Calcium: 8.8 mg/dL (ref 8.7–10.2)
Chloride: 106 mmol/L (ref 96–106)
Creatinine, Ser: 0.73 mg/dL (ref 0.57–1.00)
Glucose: 68 mg/dL — ABNORMAL LOW (ref 70–99)
Potassium: 4.1 mmol/L (ref 3.5–5.2)
Sodium: 143 mmol/L (ref 134–144)
eGFR: 95 mL/min/{1.73_m2} (ref >59)

## 2022-04-02 LAB — HEPATITIS B CORE ANTIBODY, TOTAL: Hep B Core Total Ab: NEGATIVE

## 2022-04-02 LAB — HEPATITIS B SURFACE ANTIBODY, QUANTITATIVE: Hepatitis B Surf Ab Quant: <3.1 m[IU]/mL — ABNORMAL LOW (ref >9.9)

## 2022-04-02 LAB — PROTIME-INR
INR: 1.1 (ref 0.9–1.2)
Prothrombin Time: 11.4 s (ref 9.1–12.0)

## 2022-04-02 LAB — HIV ANTIBODY (ROUTINE TESTING W REFLEX): HIV Screen 4th Generation wRfx: NONREACTIVE

## 2022-04-02 LAB — HEPATITIS B SURFACE ANTIGEN: Hepatitis B Surface Ag: NEGATIVE

## 2022-04-03 NOTE — Patient Instructions (Addendum)
It was nice seeing you today!  I will let you know about the results of your pap smear.  Referral to the infectious disease specialist.  Go to your ultrasound appointment scheduled this Friday. Do not eat or drink anything after midnight Thursday.  Go to your CT scan as scheduled.  Your lung cancer screening found non-cancerous appearing lung nodules. We will need to monitor this once a year.  Follow-up with me in 1 month to discuss results.  Stay well, Diana Button, MD [place] [phone]  --  Make sure to check out at the front desk before you leave today.  Please arrive at least 15 minutes prior to your scheduled appointments.  If you had blood work today, I will send you a MyChart message or a letter if results are normal. Otherwise, I will give you a call.  If you had a referral placed, they will call you to set up an appointment. Please give [place] a call if you don't hear back in the next 2 weeks.  If you need additional refills before your next appointment, please call your pharmacy first.

## 2022-04-03 NOTE — Telephone Encounter (Signed)
2nd attempt to reach patient . No answer. LVM of appt time, location and date. Salvatore Marvel, CMA

## 2022-04-03 NOTE — Progress Notes (Signed)
SUBJECTIVE:   CHIEF COMPLAINT / HPI:  Chief Complaint  Patient presents with   Gynecologic Exam    Due for pap smear.  Last visit diagnosed with hepatitis C on screening test. Advised to quit alcohol. Further lab work obtained. FIB-4 score 2.38 (>1.45). APRI score 1.0 (>0.5). Scheduled for elastography 10/6 at 9:30 (needs to be NPO). She was unaware of this appointment so I have reminded her of this appointment.  Previous advised her to stop drinking alcohol but she states her last drink was last night.  Recent CT lung cancer screening with benign appearing lung nodules, recommend continued annual low-dose chest CT. Also incidentally noted LUQ low-density soft tissue fullness. Discussed results with patient. Denies any abdominal pain, nausea, vomiting.  Patient also reports she has had some pain and swelling of the 3rd toe of the right foot. She stubbed her toe a few months ago but states she started having pain again 1 week ago. Has not been taking any medications for this.  PERTINENT  PMH / PSH: CVA, hepatitis C, tobacco use  Patient Care Team: Zola Button, MD as PCP - General (Family Medicine)   OBJECTIVE:   BP 129/85   Pulse 87   Ht 5' 2"  (1.575 m)   Wt 130 lb 9.6 oz (59.2 kg)   SpO2 100%   BMI 23.89 kg/m   Physical Exam Exam conducted with a chaperone present.  Constitutional:      General: She is not in acute distress. HENT:     Head: Normocephalic and atraumatic.     Mouth/Throat:     Comments: Poor dentition Eyes:     General: No scleral icterus.    Conjunctiva/sclera: Conjunctivae normal.  Cardiovascular:     Rate and Rhythm: Normal rate and regular rhythm.     Heart sounds: Normal heart sounds.  Pulmonary:     Effort: Pulmonary effort is normal. No respiratory distress.     Breath sounds: Normal breath sounds.  Abdominal:     General: Bowel sounds are normal.     Palpations: Abdomen is soft. There is mass.     Tenderness: There is no abdominal  tenderness.     Comments: Lower midline abdominal mass measuring at least 10 cm, non-tender.  Genitourinary:    General: Normal vulva.     Comments: Scant white vaginal discharge.  Cervix difficult to visualize but no gross abnormalities. Musculoskeletal:     Cervical back: Neck supple.     Right lower leg: No edema.     Left lower leg: No edema.     Comments: Right 3rd toe with some swelling, mildly tender to palpation.  Neurological:     Mental Status: She is alert.         04/04/2022    2:20 PM  Depression screen PHQ 2/9  Decreased Interest 0  Down, Depressed, Hopeless 0  PHQ - 2 Score 0  Altered sleeping 0  Tired, decreased energy 0  Change in appetite 0  Feeling bad or failure about yourself  0  Trouble concentrating 0  Moving slowly or fidgety/restless 0  Suicidal thoughts 0  PHQ-9 Score 0  Difficult doing work/chores Not difficult at all     {Show previous vital signs (OXBDZHGD):92426}  Last metabolic panel Lab Results  Component Value Date   GLUCOSE 68 (L) 03/29/2022   NA 143 03/29/2022   K 4.1 03/29/2022   CL 106 03/29/2022   CO2 24 03/29/2022   BUN 11 03/29/2022  CREATININE 0.73 03/29/2022   EGFR 95 03/29/2022   CALCIUM 8.8 03/29/2022   PHOS 4.2 02/05/2021   PROT 6.3 03/29/2022   ALBUMIN 3.6 (L) 03/29/2022   BILITOT 0.5 03/29/2022   ALKPHOS 77 03/29/2022   AST 74 (H) 03/29/2022   ALT 91 (H) 03/29/2022   ANIONGAP 11 02/10/2022      ASSESSMENT/PLAN:   Abdominal mass Lower abdominal midline mass appreciated on exam, asymptomatic.  Will obtain imaging to evaluate further. - CT A/P  Hepatitis C infection Active infection, does have some elevated transaminases and markers for fibrosis.  Consider treatment for hepatitis C in our office but I think she will be best served with ID specialist. - referral ID - elastography previously ordered, reminded of appointment - HAV and HBV vaccines given (nonimmune on recent labs) - strongly advised to quit  alcohol   Right toe pain, 3rd toe Recurrence after remote injury, some swelling and tenderness noted on exam.  Ongoing for 1 week.  Advised to try Tylenol and ibuprofen for now, will consider further work-up if not improving at follow-up.  HCM - pap smear performed today - flu shot given  Return in about 4 weeks (around 05/02/2022) for f/u abdominal mass, CT scan.   Zola Button, MD [place]

## 2022-04-04 ENCOUNTER — Other Ambulatory Visit (HOSPITAL_COMMUNITY)
Admission: RE | Admit: 2022-04-04 | Discharge: 2022-04-04 | Disposition: A | Discharge status: Home / Self Care | Payer: BC Managed Care – PPO | Source: Ambulatory Visit | Attending: Family Medicine | Admitting: Family Medicine

## 2022-04-04 ENCOUNTER — Encounter: Payer: Self-pay | Admitting: Family Medicine

## 2022-04-04 ENCOUNTER — Ambulatory Visit (INDEPENDENT_AMBULATORY_CARE_PROVIDER_SITE_OTHER): Payer: BC Managed Care – PPO | Admitting: Family Medicine

## 2022-04-04 VITALS — BP 129/85 | HR 87 | Ht 62.0 in | Wt 130.6 lb

## 2022-04-04 DIAGNOSIS — B1920 Unspecified viral hepatitis C without hepatic coma: Secondary | ICD-10-CM | POA: Diagnosis not present

## 2022-04-04 DIAGNOSIS — Z23 Encounter for immunization: Secondary | ICD-10-CM | POA: Diagnosis not present

## 2022-04-04 DIAGNOSIS — M79674 Pain in right toe(s): Secondary | ICD-10-CM

## 2022-04-04 DIAGNOSIS — R1905 Periumbilic swelling, mass or lump: Secondary | ICD-10-CM

## 2022-04-04 DIAGNOSIS — Z124 Encounter for screening for malignant neoplasm of cervix: Secondary | ICD-10-CM | POA: Diagnosis not present

## 2022-04-04 DIAGNOSIS — R1900 Intra-abdominal and pelvic swelling, mass and lump, unspecified site: Secondary | ICD-10-CM | POA: Insufficient documentation

## 2022-04-04 NOTE — Assessment & Plan Note (Signed)
Lower abdominal midline mass appreciated on exam, asymptomatic.  Will obtain imaging to evaluate further. - CT A/P

## 2022-04-04 NOTE — Assessment & Plan Note (Addendum)
Active infection, does have some elevated transaminases and markers for fibrosis.  Consider treatment for hepatitis C in our office but I think she will be best served with ID specialist. - referral ID - elastography previously ordered, reminded of appointment - HAV and HBV vaccines given (nonimmune on recent labs) - strongly advised to quit alcohol

## 2022-04-07 ENCOUNTER — Ambulatory Visit (HOSPITAL_COMMUNITY)
Admission: RE | Admit: 2022-04-07 | Discharge: 2022-04-07 | Disposition: A | Discharge status: Home / Self Care | Payer: BC Managed Care – PPO | Source: Ambulatory Visit | Attending: Family Medicine | Admitting: Family Medicine

## 2022-04-07 ENCOUNTER — Encounter: Payer: Self-pay | Admitting: Family Medicine

## 2022-04-07 DIAGNOSIS — B1920 Unspecified viral hepatitis C without hepatic coma: Secondary | ICD-10-CM | POA: Diagnosis not present

## 2022-04-07 DIAGNOSIS — B182 Chronic viral hepatitis C: Secondary | ICD-10-CM | POA: Diagnosis not present

## 2022-04-07 LAB — CYTOLOGY - PAP
Comment: NEGATIVE
Diagnosis: NEGATIVE
High risk HPV: NEGATIVE

## 2022-04-10 ENCOUNTER — Telehealth: Payer: Self-pay

## 2022-04-10 NOTE — Telephone Encounter (Signed)
3rd attempt to reach patient about CT scan at [place] at the 315 location @ 2:00pm. Patient is to pick up contrast at the 315 location on November 1st for her November 3rd appt.  Was unable to LVM due to mailbox being full. Salvatore Marvel, CMA

## 2022-04-12 ENCOUNTER — Ambulatory Visit (HOSPITAL_COMMUNITY): Payer: BC Managed Care – PPO

## 2022-04-17 ENCOUNTER — Telehealth: Payer: Self-pay

## 2022-04-17 ENCOUNTER — Encounter: Payer: Self-pay | Admitting: Family Medicine

## 2022-04-17 NOTE — Telephone Encounter (Signed)
Patient has CT scheduled for 11/3 at [facility]. Salvatore Marvel, CMA

## 2022-04-18 ENCOUNTER — Telehealth: Payer: Self-pay

## 2022-04-18 ENCOUNTER — Other Ambulatory Visit (HOSPITAL_COMMUNITY): Payer: Self-pay

## 2022-04-18 NOTE — Telephone Encounter (Signed)
[facility] Patient Advocate Encounter  Insurance verification completed.    The patient is insured through [employer].  Medication will need a PA.  We will continue to follow to see if copay assistance is needed.  Pamala Hayman, CPhT Specialty Pharmacy Patient [facility] Phone: [phone] Fax:  [phone]  

## 2022-04-19 ENCOUNTER — Ambulatory Visit (INDEPENDENT_AMBULATORY_CARE_PROVIDER_SITE_OTHER): Payer: BC Managed Care – PPO | Admitting: Infectious Diseases

## 2022-04-19 ENCOUNTER — Other Ambulatory Visit: Payer: Self-pay

## 2022-04-19 ENCOUNTER — Encounter: Payer: Self-pay | Admitting: Infectious Diseases

## 2022-04-19 VITALS — BP 145/85 | HR 79 | Temp 98.1°F | Wt 137.0 lb

## 2022-04-19 DIAGNOSIS — Z789 Other specified health status: Secondary | ICD-10-CM | POA: Diagnosis not present

## 2022-04-19 DIAGNOSIS — B182 Chronic viral hepatitis C: Secondary | ICD-10-CM

## 2022-04-19 DIAGNOSIS — R768 Other specified abnormal immunological findings in serum: Secondary | ICD-10-CM | POA: Diagnosis not present

## 2022-04-19 MED ORDER — SOFOSBUVIR-VELPATASVIR 400-100 MG PO TABS: 1.0000 | ORAL_TABLET | Freq: Every day | ORAL | 2 refills | Status: DC

## 2022-04-19 NOTE — Patient Instructions (Addendum)
Nice to meet you today!    We need to get a little more information about your hepatitis c infection before we start your treatment. I anticipate that we can get you started in a few weeks after we submit approval to your insurance to ensure payment. We may need to place referral for an ultrasound and/or gastroenterology if your blood work indicates more damage to the liver than expected.     ABOUT HEPATITIS C VIRUS:   Chronic Hepatitis C is the most common blood-borne infection in the [place], affecting approximately 3 million people.   It is the leading cause of cirrhosis, liver cancer, and end stage liver disease requiring transplantation when this infection goes untreated for many years   The majority of people who are infected are unaware because there are not many early symptoms that are specific to this and often go undiagnosed until a specific blood test is drawn.    The hepatitis c virus is passed primarily through direct exposure of contaminated blood or body fluids. It is most efficiently transmitted through repeated exposure to infected blood.   Risk for sexual transmission is very low but is possible if there is high frequency of unprotected sexual activity with known hepatitis c partner or multiple partners of known status.   Over time, approximately 60-70% of people can develop some degree of liver disease. Cirrhosis occurs in 10-20% of those with chronic infection. 1-5% will get liver cancer, which has a very high rate of death.    Approximately 15-25% clear the infection without medication (usually in the first 6 months of becoming exposed to virus)   Newer medications provide over 95% cure rate when taken as prescribed    IN GENERAL ABOUT DIET  . Persons living with chronic hepatitis c infection should eat a diet to maintain a healthy weight and avoid nutritional deficiencies.   . Completely avoiding alcohol is the best decision for your liver health. If  unable to do so please limit alcohol to as little as possible to less than 1 standard drink a day - this is very irritating to your liver.  . Limit tylenol use to less than 2,000 mg daily (two extra strength tablets only twice a day)  . If you have cirrhosis of the liver please take no more than 1,000 mg tylenol a day  . Patients with cirrhosis should not have protein restriction; we recommend a protein intake of approximately 1.2-1.5 g/kg/day.   . For patients with cirrhosis and hepatic encephalopathy, the American Association for the Study of Liver Diseases (AASLD) recommended protein intake is 1.2-1.5 g/kg/day.  . If you experience ascites (fluid accumulation in the abdomen associated with severe liver damage / cirrhosis) please limit sodium intake to < 2000 mg a day    UNTIL YOU HAVE BEEN TREATED AND CURED:  . Use condoms with all sexual encounters or practice abstinence to avoid sexual transmission   . No sharing of razors, toothbrushes, nail clippers or anything that could potentially have blood on it.   . If you cut yourself please clean and cover any wounds or open sores to others do not come into contact with your blood.   . If blood spills onto item/surface please clean with 1:10 bleach solution and allow to dry, EVEN if it is dried blood.    GENERAL HELPFUL HINTS ON HCV THERAPY:  1. Stay well-hydrated.  2. Notify the ID Clinic of any changes in your other over-the-counter/herbal or prescription medications.    3. If you miss a dose of your medication, take the missed dose as soon as you remember. Return to your regular time/dose schedule the next day.   4.  Do not stop taking your medications without first talking with your healthcare provider.  5.  You will see our pharmacist-specialist within the first 2 weeks of starting your medication to monitor for any possible side effects.  6.  You will have blood work once during treatment 4 weeks after your first pill. Again  soon after treatment is completed and one final lab 3 months after your last pill to ensure cure!   TIPS TO BE SUCCESSFUL WITH DAILY MEDICATION USE:  1. Set a reminder on your phone  2. Try filling out a pill box for the week - pick a day and put one pill for every day during the week so you know right away if you missed a pill.   3. Have a trusted family member ask you about your medications.   4. Smartphone app     

## 2022-04-19 NOTE — Assessment & Plan Note (Signed)
Counseled that it would be in her best interest to avoid binge drinking and keep alcohol consumption to 1 standard drink a day or less (preferably). She agreed to keep this a priority.

## 2022-04-19 NOTE — Assessment & Plan Note (Signed)
New Patient with Chronic Hepatitis C genotype unknown, treatment naive. Transmission risk likely intranasal drug use.   I discussed with the patient the lab findings that confirm chronic hepatitis C as well as the natural history and progression of disease including about 30% of people who develop cirrhosis of the liver if left untreated and once cirrhosis is established there is a 2-7% risk per year of liver cancer and liver failure.  I discussed the importance of treatment and benefits in reducing the risk, even if significant liver fibrosis exists. I also discussed risk for re-infection following treatment should he not continue to modify risk factors.    Patient counseled extensively on limiting acetaminophen to no more than 2 grams daily, avoidance of alcohol.  Transmission discussed with patient including sexual transmission, sharing razors and toothbrush.   Will need referral to gastroenterology if concern for cirrhosis  Will prescribe appropriate medication based on genotype and coverage   Hepatitis A and B titers to be drawn today with appropriate vaccinations as needed   Pneumovax vaccine at upcoming visit if not previously given  Elastography reviewed - hepatitis steatosis with low likelihood of cirrhosis with kPcal < 5.3   Will call Diana Harris back once all results are in and counsel on medication over the phone. She will return 4 weeks after starting to meet with pharmacy team and check RNA at that time.

## 2022-04-19 NOTE — Assessment & Plan Note (Signed)
Medication interaction with DAAs and statin where the AUC of lipitor will be increased 50% - She will have to either drop down the dose of to 40mg  max daily or hold for the duration of HCV treatment (12 weeks).  Will reach out to PCP to see how they prefer to proceed.

## 2022-04-19 NOTE — Progress Notes (Signed)
Patient Name: Diana Harris  Date of Birth: 05-01-1966  MRN: [id number]  PCP: Zola Button, MD  Referring Provider: Zola Button, MD, Ph#: [phone]   Patient Active Problem List   Diagnosis Date Noted   Drug interaction 04/19/2022   Alcohol use 04/19/2022   Abdominal mass 04/04/2022   Hepatitis C infection 03/25/2022   Polysubstance abuse ([place]) 02/15/2022   Poor dentition 02/14/2021   CVA (cerebral vascular accident) ([place]) 02/04/2021   Ischemic stroke ([place]) 08/22/2017   Tobacco use disorder 08/22/2017    CC:  New patient - initial evaluation and management of chronic hepatitis C infection.    HPI/ROS:  Diana Harris is a 56 y.o. female here for evaluation of treatment for Hepatitis C infection.   Ms Barcia has not bene tested for hepatitis c virus before to her knowledge. Checked in the setting of elevated LFTs. She has had a heavy drinking history for many years but in light of this information she has over the last 2 months cut down from > 6 pk a day to one 12 oz beer daily. She has done well keeping this low frequency and feels like she can continue this. She does have a cocaine history (intranasal/smoking) and states she cannot recall the last time she used was (positive tox screen in August 2023 at ER visit for syncope eval). Sexually active with 1 female partner for a few years, no condoms. She has in the past had sex with men who have been into drugs in various capacities but no one with known hepatitis / liver disease.   PMHx CVA in the past - on ASA and high dose lipitor 80 mg daily.    Would prefer to come to [facility] to pick up medication if we can coordinate that.     Review of Systems  Constitutional:  Negative for appetite change, fatigue, fever and unexpected weight change.  Respiratory:  Negative for shortness of breath.   Cardiovascular:  Negative for chest pain and leg swelling.  Gastrointestinal:  Negative for abdominal pain, blood in stool,  nausea and vomiting.  Genitourinary:  Negative for difficulty urinating and hematuria.  Musculoskeletal:  Negative for arthralgias.  Skin:  Negative for color change.  Neurological:  Negative for dizziness, tremors and headaches.  Hematological:  Negative for adenopathy.  Psychiatric/Behavioral:  Negative for confusion.    All other systems reviewed and are negative        Past Medical History:  Diagnosis Date   Ischemic stroke ([place]) 08/22/2017   Right posterior occipital ischemic stroke/notes 08/22/2017   Tobacco abuse     Prior to Admission medications   Medication Sig Start Date End Date Taking? Authorizing Provider  aspirin EC 325 MG tablet Take 1 tablet (325 mg total) by mouth daily. 02/14/22  Yes Alcus Dad, MD  atorvastatin (LIPITOR) 80 MG tablet Take 1 tablet (80 mg total) by mouth daily. 02/14/22  Yes Alcus Dad, MD    No Known Allergies  Social History   Tobacco Use   Smoking status: Every Day    Packs/day: 0.30    Years: 40.00    Total pack years: 12.00    Types: Cigarettes   Smokeless tobacco: Never   Tobacco comments:    Trying to cut back   Vaping Use   Vaping Use: Never used  Substance Use Topics   Alcohol use: Yes    Alcohol/week: 6.0 standard drinks of alcohol    Types: 6 Cans of beer per  week    Comment: about 1 beer a day   Drug use: Yes    Types: "Crack" cocaine    Comment: Hx of cocaine use (smoked)    Family History  Problem Relation Age of Onset   Hypertension Mother    Hypertension Father    Lung cancer Sister    Prostate cancer Brother      Objective:   Vitals:   04/19/22 1351  BP: (!) 145/85  Pulse: 79  Temp: 98.1 F (36.7 C)  SpO2: 94%   Constitutional: in no apparent distress and oriented times 3 Eyes: anicteric Cardiovascular: Cor RRR Respiratory: clear Gastrointestinal: Bowel sounds are normal, liver is not enlarged, spleen is not enlarged Musculoskeletal: peripheral pulses normal, no pedal edema, no  clubbing or cyanosis Skin: negative for - jaundice, spider hemangioma, telangiectasia, palmar erythema, ecchymosis and atrophy; no porphyria cutanea tarda Lymphatic: no cervical lymphadenopathy   Laboratory: Genotype: No results found for: "HCVGENOTYPE" HCV viral load: No results found for: "HCVQUANT" Lab Results  Component Value Date   WBC 3.7 03/29/2022   HGB 13.3 03/29/2022   HCT 38.8 03/29/2022   MCV 94 03/29/2022   PLT 189 03/29/2022    Lab Results  Component Value Date   CREATININE 0.73 03/29/2022   BUN 11 03/29/2022   NA 143 03/29/2022   K 4.1 03/29/2022   CL 106 03/29/2022   CO2 24 03/29/2022    Lab Results  Component Value Date   ALT 91 (H) 03/29/2022   AST 74 (H) 03/29/2022   ALKPHOS 77 03/29/2022    Lab Results  Component Value Date   INR 1.1 03/29/2022   BILITOT 0.5 03/29/2022   ALBUMIN 3.6 (L) 03/29/2022   Imaging:  Elastography 04/2022 - hepatic steatosis, kPcal < 5   Assessment & Plan:   Problem List Items Addressed This Visit       Unprioritized   Hepatitis C infection - Primary    New Patient with Chronic Hepatitis C genotype unknown, treatment naive. Transmission risk likely intranasal drug use.   I discussed with the patient the lab findings that confirm chronic hepatitis C as well as the natural history and progression of disease including about 30% of people who develop cirrhosis of the liver if left untreated and once cirrhosis is established there is a 2-7% risk per year of liver cancer and liver failure.  I discussed the importance of treatment and benefits in reducing the risk, even if significant liver fibrosis exists. I also discussed risk for re-infection following treatment should he not continue to modify risk factors.   Patient counseled extensively on limiting acetaminophen to no more than 2 grams daily, avoidance of alcohol. Transmission discussed with patient including sexual transmission, sharing razors and toothbrush.  Will need  referral to gastroenterology if concern for cirrhosis Will prescribe appropriate medication based on genotype and coverage  Hepatitis A and B titers to be drawn today with appropriate vaccinations as needed  Pneumovax vaccine at upcoming visit if not previously given Elastography reviewed - hepatitis steatosis with low likelihood of cirrhosis with kPcal < 5.3   Will call Chalon Zobrist Rueter back once all results are in and counsel on medication over the phone. She will return 4 weeks after starting to meet with pharmacy team and check RNA at that time.        Relevant Medications   Sofosbuvir-Velpatasvir (EPCLUSA) 400-100 MG TABS   Other Relevant Orders   Liver Fibrosis, FibroTest-ActiTest   Hepatitis C genotype  Protime-INR   Drug interaction    Medication interaction with DAAs and statin where the AUC of lipitor will be increased 50% - She will have to either drop down the dose of to 40mg  max daily or hold for the duration of HCV treatment (12 weeks).  Will reach out to PCP to see how they prefer to proceed.       Alcohol use    Counseled that it would be in her best interest to avoid binge drinking and keep alcohol consumption to 1 standard drink a day or less (preferably). She agreed to keep this a priority.        , MSN, NP-C [facility] for Infectious Disease [facility]  [email or web address] Pager: [phone] Office: [phone] [facility] Main Line: [phone]

## 2022-04-20 ENCOUNTER — Other Ambulatory Visit (HOSPITAL_COMMUNITY): Payer: Self-pay

## 2022-04-20 NOTE — Progress Notes (Signed)
Patient with chronic hepatitis C genotype 1a.  Would like to do [place] x12 weeks as she like to 1 pill once a day option.  We will need to reduce her atorvastatin to 40 mg once a day during treatment.

## 2022-04-21 ENCOUNTER — Telehealth: Payer: Self-pay

## 2022-04-21 NOTE — Telephone Encounter (Signed)
[facility] Patient Advocate Encounter   Received notification from [facility] that prior authorization for [place] is required.   PA submitted on 04/21/2022 [facility] Status is pending    [facility] will continue to follow.   Ileene Patrick, [place] [facility] Patient [facility] for Infectious Disease Phone: [phone] Fax:  [phone]

## 2022-04-24 ENCOUNTER — Telehealth: Payer: Self-pay

## 2022-04-24 ENCOUNTER — Other Ambulatory Visit: Payer: Self-pay | Admitting: Pharmacist

## 2022-04-24 ENCOUNTER — Other Ambulatory Visit (HOSPITAL_COMMUNITY): Payer: Self-pay

## 2022-04-24 DIAGNOSIS — B182 Chronic viral hepatitis C: Secondary | ICD-10-CM

## 2022-04-24 LAB — LIVER FIBROSIS, FIBROTEST-ACTITEST
ALT: 71 U/L — ABNORMAL HIGH (ref 6–29)
Alpha-2-Macroglobulin: 218 mg/dL (ref 106–279)
Apolipoprotein A1: 151 mg/dL (ref 101–198)
Bilirubin: 0.4 mg/dL (ref 0.2–1.2)
Fibrosis Score: 0.35
GGT: 73 U/L — ABNORMAL HIGH (ref 3–70)
Haptoglobin: 68 mg/dL (ref 43–212)
Necroinflammat ACT Score: 0.44
Reference ID: 4604716

## 2022-04-24 LAB — HEPATITIS C GENOTYPE

## 2022-04-24 LAB — PROTIME-INR
INR: 1
Prothrombin Time: 10.9 s (ref 9.0–11.5)

## 2022-04-24 MED ORDER — SOFOSBUVIR-VELPATASVIR 400-100 MG PO TABS
1.0000 | ORAL_TABLET | Freq: Every day | ORAL | 2 refills | Status: DC
  Filled 2022-04-24 – 2022-04-28 (×2): qty 28, 28d supply, fill #0
  Filled 2022-05-24: qty 28, 28d supply, fill #1
  Filled 2022-06-16 (×3): qty 28, 28d supply, fill #2

## 2022-04-24 NOTE — Telephone Encounter (Signed)
[facility] Patient Advocate Encounter  Prior Authorization for Epclusa (generic) has been approved.    PA# [id number] Effective dates: 04/22/22 through 10/19/22  Patients co-pay is $1545.60.   Prescription can be filled at [facility].         [facility] will continue to follow.  Ileene Patrick, [place] [facility] Pharm[facility] Patient [facility] for Infectious Disease Phone: [phone] Fax:  [phone]

## 2022-04-25 ENCOUNTER — Other Ambulatory Visit (HOSPITAL_COMMUNITY): Payer: Self-pay

## 2022-04-28 ENCOUNTER — Other Ambulatory Visit (HOSPITAL_COMMUNITY): Payer: Self-pay

## 2022-05-02 ENCOUNTER — Telehealth: Payer: Self-pay

## 2022-05-02 NOTE — Telephone Encounter (Signed)
[facility] Patient Advocate Encounter  Patient's medications have been couriered to [facility] from [facility] and will be picked up .  [place]  Ileene Patrick , [place] Patient [facility] for Infectious Disease Phone: [phone] Fax:  [phone]

## 2022-05-05 ENCOUNTER — Other Ambulatory Visit: Payer: BC Managed Care – PPO

## 2022-05-10 ENCOUNTER — Encounter: Payer: Self-pay | Admitting: Family Medicine

## 2022-05-11 ENCOUNTER — Telehealth: Payer: Self-pay

## 2022-05-11 NOTE — Telephone Encounter (Signed)
[facility] Patient Advocate Encounter  I have been unsuccsessful in reaching patient to be able to start medication.  Dorita Fray)    We have tried multiple times without a response.  Clearance Coots, CPhT Specialty Pharmacy Patient [facility] for Infectious Disease Phone: [phone] Fax:  [phone]

## 2022-05-12 NOTE — Telephone Encounter (Signed)
FYI Steph..

## 2022-05-15 ENCOUNTER — Ambulatory Visit: Payer: Self-pay | Admitting: Family Medicine

## 2022-05-15 NOTE — Patient Instructions (Incomplete)
It was nice seeing you today!  Blood work today.  See me in 3 months or whenever is a good for you.  Stay well, Khalel Alms, MD [facility] [phone]  --  Make sure to check out at the front desk before you leave today.  Please arrive at least 15 minutes prior to your scheduled appointments.  If you had blood work today, I will send you a MyChart message or a letter if results are normal. Otherwise, I will give you a call.  If you had a referral placed, they will call you to set up an appointment. Please give us a call if you don't hear back in the next 2 weeks.  If you need additional refills before your next appointment, please call your pharmacy first.  

## 2022-05-15 NOTE — Progress Notes (Deleted)
    SUBJECTIVE:   CHIEF COMPLAINT / HPI:  No chief complaint on file.   She is established with ID for chronic hepatitis C genotype 1a, planned to do Epclusa x12 weeks.  Last visit I saw her for abdominal mass, ordered abdominal CT which has been scheduled on 11/3 but patient missed the appointment.  PERTINENT  PMH / PSH: CVA, hepatitis C, tobacco use  Patient Care Team: Littie Deeds, MD as PCP - General (Family Medicine)   OBJECTIVE:   There were no vitals taken for this visit.  Physical Exam      04/04/2022    2:20 PM  Depression screen PHQ 2/9  Decreased Interest 0  Down, Depressed, Hopeless 0  PHQ - 2 Score 0  Altered sleeping 0  Tired, decreased energy 0  Change in appetite 0  Feeling bad or failure about yourself  0  Trouble concentrating 0  Moving slowly or fidgety/restless 0  Suicidal thoughts 0  PHQ-9 Score 0  Difficult doing work/chores Not difficult at all     {Show previous vital signs (optional):23777}  {Labs  Heme  Chem  Endocrine  Serology  Results Review (optional):23779}  ASSESSMENT/PLAN:   No problem-specific Assessment & Plan notes found for this encounter.    No follow-ups on file.   Littie Deeds, MD [facility]

## 2022-05-16 ENCOUNTER — Ambulatory Visit (INDEPENDENT_AMBULATORY_CARE_PROVIDER_SITE_OTHER): Payer: BC Managed Care – PPO | Admitting: Family Medicine

## 2022-05-16 ENCOUNTER — Encounter: Payer: Self-pay | Admitting: Family Medicine

## 2022-05-16 VITALS — BP 116/81 | HR 74 | Ht 62.0 in | Wt 132.4 lb

## 2022-05-16 DIAGNOSIS — B182 Chronic viral hepatitis C: Secondary | ICD-10-CM | POA: Diagnosis not present

## 2022-05-16 DIAGNOSIS — Z59819 Housing instability, housed unspecified: Secondary | ICD-10-CM

## 2022-05-16 DIAGNOSIS — Z659 Problem related to unspecified psychosocial circumstances: Secondary | ICD-10-CM | POA: Insufficient documentation

## 2022-05-16 DIAGNOSIS — R1905 Periumbilic swelling, mass or lump: Secondary | ICD-10-CM | POA: Diagnosis not present

## 2022-05-16 DIAGNOSIS — Z5941 Food insecurity: Secondary | ICD-10-CM | POA: Diagnosis not present

## 2022-05-16 DIAGNOSIS — I639 Cerebral infarction, unspecified: Secondary | ICD-10-CM

## 2022-05-16 DIAGNOSIS — Z609 Problem related to social environment, unspecified: Secondary | ICD-10-CM

## 2022-05-16 DIAGNOSIS — Z789 Other specified health status: Secondary | ICD-10-CM

## 2022-05-16 DIAGNOSIS — F1090 Alcohol use, unspecified, uncomplicated: Secondary | ICD-10-CM

## 2022-05-16 MED ORDER — ASPIRIN 325 MG PO TBEC: 325.0000 mg | DELAYED_RELEASE_TABLET | Freq: Every day | ORAL | 3 refills | Status: DC

## 2022-05-16 MED ORDER — ATORVASTATIN CALCIUM 80 MG PO TABS: 80.0000 mg | ORAL_TABLET | Freq: Every day | ORAL | 3 refills | Status: DC

## 2022-05-16 NOTE — Assessment & Plan Note (Signed)
ASA and atorvastatin refilled.  Reminded patient that she needs to reduce her atorvastatin dose due to medication interaction, advised her to take half of her 80 mg tablet when she starts hepatitis C treatment.

## 2022-05-16 NOTE — Assessment & Plan Note (Signed)
Has quit drinking, commended patient

## 2022-05-16 NOTE — Assessment & Plan Note (Signed)
Patient has some financial strain, housing instability, and food insecurity.  Amenable to social work assistance.  She does not have a working phone currently but is planning to reinstate her phone soon. - referral CCM

## 2022-05-16 NOTE — Progress Notes (Signed)
SUBJECTIVE:   CHIEF COMPLAINT / HPI:  Chief Complaint  Patient presents with   Medication Refill    She is established with ID for chronic hepatitis C genotype 1a, planned to do Epclusa x12 weeks.  However, patient never picked up the medication.  She reports she did not hear about the medication because her phone is not in service currently, she has not been able to pay the phone bill.  She reports she has quit drinking alcohol.  Last visit I saw her for abdominal mass, ordered abdominal CT which has been scheduled on 11/3 but patient missed the appointment.  She states that her sister had a flat tire so she was unable to make it to the appointment.  She still denies any abdominal discomfort today.  In addition to difficulty paying the phone bill, she has had difficulty paying rent at times as well.  She has been evicted in the past but has been stable at her current place for 1.5 years.  She does endorse food insecurity as well.  She was previously getting food stamps but not anymore.  She is still working at [employer].  She needs refill for her aspirin and atorvastatin.  PERTINENT  PMH / PSH: CVA, hepatitis C, tobacco use  Patient Care Team: Zola Button, MD as PCP - General (Family Medicine)   OBJECTIVE:   BP 116/81   Pulse 74   Ht 5\' 2"  (1.575 m)   Wt 132 lb 6.4 oz (60.1 kg)   SpO2 98%   BMI 24.22 kg/m   Physical Exam Constitutional:      General: She is not in acute distress. HENT:     Head:     Comments: Poor dentition Cardiovascular:     Rate and Rhythm: Normal rate and regular rhythm.  Pulmonary:     Effort: Pulmonary effort is normal. No respiratory distress.     Comments: Slightly diminished breath sounds throughout Abdominal:     Palpations: Abdomen is soft. There is mass.     Comments: Lower abdominal midline firm mass measuring greater than 10 cm, nontender  Musculoskeletal:     Cervical back: Neck supple.  Neurological:     Mental Status: She is  alert.         05/16/2022    2:38 PM  Depression screen PHQ 2/9  Decreased Interest 0  Down, Depressed, Hopeless 0  PHQ - 2 Score 0  Altered sleeping 0  Tired, decreased energy 1  Change in appetite 0  Feeling bad or failure about yourself  0  Trouble concentrating 0  Moving slowly or fidgety/restless 0  Suicidal thoughts 0  PHQ-9 Score 1  Difficult doing work/chores Not difficult at all     {Show previous vital signs (optional):23777}    ASSESSMENT/PLAN:   Alcohol use Has quit drinking, commended patient  Abdominal mass Abdominal mass remained stable and she remains asymptomatic.  Could be large fibroid but still think she needs further evaluation.  She unfortunately missed her CT appointment so we will reschedule this. - CT A/P rescheduled  Hepatitis C infection Has not yet picked up her medications yet to start treatment due to her phone being out of service.  Provided her with pharmacy tech and pharmacy contact information.  Ischemic stroke ([place]) ASA and atorvastatin refilled.  Reminded patient that she needs to reduce her atorvastatin dose due to medication interaction, advised her to take half of her 80 mg tablet when she starts hepatitis C treatment.  Poor social situation Patient has some financial strain, housing instability, and food insecurity.  Amenable to social work assistance.  She does not have a working phone currently but is planning to reinstate her phone soon. - referral CCM   HCM - Covid vaccine declined - colonoscopy referral previously placed, contact information provided  Return in about 4 weeks (around 06/13/2022) for f/u abdominal mass.   Littie Deeds, MD [facility]

## 2022-05-16 NOTE — Patient Instructions (Addendum)
It was nice seeing you today!  [facility] Address: [address] [place], [place], [place] [address] Phone: [phone]  Pharmacy tech: Clearance Coots , CPhT Specialty Pharmacy Patient [facility] Phone: [phone]  Call GI about your colonoscopy appointment: [facility] Address: [address], [place], [place] [address] Phone: [phone]  Get your CT scan as scheduled.  I am setting you up with one of our social workers.  Stay well, Littie Deeds, MD [facility] [phone]  --  Make sure to check out at the front desk before you leave today.  Please arrive at least 15 minutes prior to your scheduled appointments.  If you had blood work today, I will send you a MyChart message or a letter if results are normal. Otherwise, I will give you a call.  If you had a referral placed, they will call you to set up an appointment. Please give [place] a call if you don't hear back in the next 2 weeks.  If you need additional refills before your next appointment, please call your pharmacy first.

## 2022-05-16 NOTE — Assessment & Plan Note (Addendum)
Abdominal mass remained stable and she remains asymptomatic.  Could be large fibroid but still think she needs further evaluation.  She unfortunately missed her CT appointment so we will reschedule this. - CT A/P rescheduled

## 2022-05-16 NOTE — Assessment & Plan Note (Signed)
Has not yet picked up her medications yet to start treatment due to her phone being out of service.  Provided her with pharmacy tech and pharmacy contact information.

## 2022-05-17 ENCOUNTER — Telehealth: Payer: Self-pay | Admitting: *Deleted

## 2022-05-17 NOTE — Progress Notes (Signed)
  Care Coordination  Outreach Note  05/17/2022 Name: Diana Harris MRN: [id number] DOB: 10/22/1965   Care Coordination Outreach Attempts: An unsuccessful telephone outreach was attempted today to offer the patient information about available care coordination services as a benefit of their health plan.   Follow Up Plan:  Additional outreach attempts will be made to offer the patient care coordination information and services.   Encounter Outcome:  No Answer  Christie Nottingham  Care Coordination Care Guide  Direct Dial: [phone]

## 2022-05-23 ENCOUNTER — Other Ambulatory Visit (HOSPITAL_COMMUNITY): Payer: Self-pay

## 2022-05-23 NOTE — Progress Notes (Signed)
  Care Coordination  Outreach Note  05/23/2022 Name: Diana Harris MRN: [id number] DOB: 12/06/1965   Care Coordination Outreach Attempts: A second unsuccessful outreach was attempted today to offer the patient with information about available care coordination services as a benefit of their health plan.     Follow Up Plan:  Additional outreach attempts will be made to offer the patient care coordination information and services.   Encounter Outcome:  No Answer  Christie Nottingham  Care Coordination Care Guide  Direct Dial: [phone]

## 2022-05-24 ENCOUNTER — Encounter (HOSPITAL_COMMUNITY): Payer: Self-pay

## 2022-05-24 ENCOUNTER — Other Ambulatory Visit (HOSPITAL_COMMUNITY): Payer: Self-pay

## 2022-05-24 ENCOUNTER — Ambulatory Visit (HOSPITAL_COMMUNITY)
Admission: RE | Admit: 2022-05-24 | Discharge: 2022-05-24 | Disposition: A | Discharge status: Home / Self Care | Payer: BC Managed Care – PPO | Source: Ambulatory Visit | Attending: Family Medicine | Admitting: Family Medicine

## 2022-05-24 DIAGNOSIS — R1905 Periumbilic swelling, mass or lump: Secondary | ICD-10-CM | POA: Insufficient documentation

## 2022-05-24 DIAGNOSIS — R1900 Intra-abdominal and pelvic swelling, mass and lump, unspecified site: Secondary | ICD-10-CM | POA: Diagnosis not present

## 2022-05-24 MED ORDER — IOHEXOL 300 MG/ML  SOLN
100.0000 mL | Freq: Once | INTRAMUSCULAR | Status: AC | PRN
  Administered 2022-05-24: 100 mL via INTRAVENOUS

## 2022-05-24 MED ORDER — SODIUM CHLORIDE (PF) 0.9 % IJ SOLN
INTRAMUSCULAR | Status: AC
  Filled 2022-05-24: qty 50

## 2022-05-29 ENCOUNTER — Telehealth: Payer: Self-pay | Admitting: Family Medicine

## 2022-05-29 ENCOUNTER — Other Ambulatory Visit (HOSPITAL_COMMUNITY): Payer: Self-pay

## 2022-05-29 DIAGNOSIS — R1900 Intra-abdominal and pelvic swelling, mass and lump, unspecified site: Secondary | ICD-10-CM

## 2022-05-29 NOTE — Progress Notes (Signed)
  Care Coordination   Note   05/29/2022 Name: Diana Harris MRN: [id number] DOB: Jan 10, 1966  Diana Harris is a 56 y.o. year old female who sees Littie Deeds, MD for primary care. I reached out to Lawerance Cruel by phone today to offer care coordination services.  Ms. Wires was given information about Care Coordination services today including:   The Care Coordination services include support from the care team which includes your Nurse Coordinator, Clinical Social Worker, or Pharmacist.  The Care Coordination team is here to help remove barriers to the health concerns and goals most important to you. Care Coordination services are voluntary, and the patient may decline or stop services at any time by request to their care team member.   Care Coordination Consent Status: Patient agreed to services and verbal consent obtained.   Follow up plan:  Telephone appointment with care coordination team member scheduled for:  05/30/22  Encounter Outcome:  Pt. Scheduled   [facility] Coordination Care Guide  Direct Dial: [phone]

## 2022-05-29 NOTE — Telephone Encounter (Signed)
Spoke with patient to discuss CT scan results.  There is a large complex mass occupying the left hemiabdomen concerning for ovarian malignancy, recommended gynecology referral.  Discussed with patient and referral has been placed.  She also informed me that she went to pick up her hepatitis C medications but they did not have the medications there and apparently medications were mailed to her house but at the wrong address.  I told her just to discussed with the pharmacy tech, she still has the phone number on hand.  I told her I would reach out to the pharmacy tech as well.  She was wondering if she could have the medications delivered to the family medicine clinic to pick up.  Patient also missed call from social worker as her phone was still offline at that time.  She is not able to write down the phone number at this time.  I told her to call our office when she is able to take down a phone number and to reach out to the social worker when she has a chance.

## 2022-05-30 ENCOUNTER — Telehealth: Payer: Self-pay

## 2022-05-30 ENCOUNTER — Ambulatory Visit: Payer: Self-pay | Admitting: Licensed Clinical Social Worker

## 2022-05-30 NOTE — Patient Outreach (Signed)
  Care Coordination   05/30/2022 Name: BERT GIVANS MRN: [id number] DOB: Nov 30, 1965   Care Coordination Outreach Attempts:  An unsuccessful telephone outreach was attempted today to offer the patient information about available care coordination services as a benefit of their health plan.   Follow Up Plan:  No further outreach attempts will be made at this time. We have been unable to contact the patient to offer or enroll patient in care coordination services  Encounter Outcome:  No Answer   Care Coordination Interventions:  No, not indicated    Christen Butter, Kenard Gower, MSW, LCSW-A  Social Worker [facility] Care [facility]  [phone]

## 2022-05-30 NOTE — Telephone Encounter (Signed)
[facility] Patient Advocate Encounter  Patient's medications have been couriered to [facility] from [facility] Specialty pharmacy and will be picked up 06/28/22.  2nd Epclusa box.  Clearance Coots , CPhT Specialty Pharmacy Patient [facility] Phone: [phone] Fax:  [phone]

## 2022-06-16 ENCOUNTER — Other Ambulatory Visit (HOSPITAL_COMMUNITY): Payer: Self-pay

## 2022-06-16 ENCOUNTER — Other Ambulatory Visit: Payer: Self-pay

## 2022-06-19 ENCOUNTER — Telehealth: Payer: Self-pay

## 2022-06-19 NOTE — Telephone Encounter (Signed)
[facility] Patient Advocate Encounter  Patient's medications have been couriered to [facility] from [facility] Specialty pharmacy and will be picked up 06/28/22.  3rd Epclusa box  Clearance Coots , CPhT Specialty Pharmacy Patient [facility] Phone: [phone] Fax:  [phone]

## 2022-06-28 ENCOUNTER — Ambulatory Visit (INDEPENDENT_AMBULATORY_CARE_PROVIDER_SITE_OTHER): Payer: BC Managed Care – PPO | Admitting: Pharmacist

## 2022-06-28 ENCOUNTER — Ambulatory Visit: Payer: Self-pay | Admitting: Family Medicine

## 2022-06-28 ENCOUNTER — Other Ambulatory Visit: Payer: Self-pay

## 2022-06-28 DIAGNOSIS — Z23 Encounter for immunization: Secondary | ICD-10-CM | POA: Diagnosis not present

## 2022-06-28 DIAGNOSIS — B182 Chronic viral hepatitis C: Secondary | ICD-10-CM

## 2022-06-28 DIAGNOSIS — R768 Other specified abnormal immunological findings in serum: Secondary | ICD-10-CM | POA: Diagnosis not present

## 2022-06-28 NOTE — Patient Instructions (Incomplete)
It was nice seeing you today!  Blood work today.  See me in 3 months or whenever is a good for you.  Stay well, Khan Chura, MD [facility] [phone]  --  Make sure to check out at the front desk before you leave today.  Please arrive at least 15 minutes prior to your scheduled appointments.  If you had blood work today, I will send you a MyChart message or a letter if results are normal. Otherwise, I will give you a call.  If you had a referral placed, they will call you to set up an appointment. Please give us a call if you don't hear back in the next 2 weeks.  If you need additional refills before your next appointment, please call your pharmacy first.  

## 2022-06-28 NOTE — Progress Notes (Signed)
06/28/2022  HPI: Diana Harris is a 56 y.o. female who [facility] C follow-up.  Medication: Epclusa x 12 weeks  Start Date: 05/31/22  Hepatitis C Genotype: 1a  Fibrosis Score: F1/F2  Hepatitis C RNA: 913,000 on 03/14/22  Patient Active Problem List   Diagnosis Date Noted   Poor social situation 05/16/2022   Drug interaction 04/19/2022   Alcohol use 04/19/2022   Abdominal mass 04/04/2022   Hepatitis C infection 03/25/2022   Polysubstance abuse (HCC) 02/15/2022   Poor dentition 02/14/2021   CVA (cerebral vascular accident) (HCC) 02/04/2021   Ischemic stroke (HCC) 08/22/2017   Tobacco use disorder 08/22/2017    Patient's Medications  New Prescriptions   No medications on file  Previous Medications   ASPIRIN EC 325 MG TABLET    Take 1 tablet (325 mg total) by mouth daily.   ATORVASTATIN (LIPITOR) 80 MG TABLET    Take 1 tablet (80 mg total) by mouth daily.   SOFOSBUVIR-VELPATASVIR (EPCLUSA) 400-100 MG TABS    Take 1 tablet by mouth daily.  Modified Medications   No medications on file  Discontinued Medications   No medications on file    Allergies: No Known Allergies  Past Medical History: Past Medical History:  Diagnosis Date   Ischemic stroke (HCC) 08/22/2017   Right posterior occipital ischemic stroke/notes 08/22/2017   Tobacco abuse     Social History: Social History   Socioeconomic History   Marital status: Single    Spouse name: Not on file   Number of children: Not on file   Years of education: Not on file   Highest education level: Not on file  Occupational History   Not on file  Tobacco Use   Smoking status: Every Day    Packs/day: 0.30    Years: 40.00    Total pack years: 12.00    Types: Cigarettes   Smokeless tobacco: Never   Tobacco comments:    Trying to cut back   Vaping Use   Vaping Use: Never used  Substance and Sexual Activity   Alcohol use: Yes    Alcohol/week: 6.0 standard drinks of  alcohol    Types: 6 Cans of beer per week    Comment: about 1 beer a day   Drug use: Yes    Types: "Crack" cocaine    Comment: Hx of cocaine use (smoked)   Sexual activity: Not on file  Other Topics Concern   Not on file  Social History Narrative   Not on file   Social Determinants of Health   Financial Resource Strain: Not on file  Food Insecurity: Not on file  Transportation Needs: Not on file  Physical Activity: Not on file  Stress: Not on file  Social Connections: Not on file    Labs: Hepatitis C Lab Results  Component Value Date   HCVGENOTYPE 1a 04/19/2022   FIBROSTAGE F1-F2 04/19/2022   Hepatitis B Lab Results  Component Value Date   HEPBSAG Negative 03/29/2022   HEPBCAB Negative 03/29/2022   Hepatitis A Lab Results  Component Value Date   HAV Negative 03/29/2022   HIV Lab Results  Component Value Date   HIV Non Reactive 03/29/2022   HIV Non Reactive 02/05/2021   HIV Non Reactive 08/22/2017   Lab Results  Component Value Date   CREATININE 0.73 03/29/2022   CREATININE 0.82 02/10/2022   CREATININE 0.73 02/05/2021   CREATININE 0.60 02/04/2021   CREATININE 0.72 02/04/2021  Lab Results  Component Value Date   AST 74 (H) 03/29/2022   AST 100 (H) 02/10/2022   AST 62 (H) 02/05/2021   ALT 71 (H) 04/19/2022   ALT 91 (H) 03/29/2022   ALT 119 (H) 02/10/2022   INR 1.0 04/19/2022   INR 1.1 03/29/2022   INR 1.0 02/10/2022    Assessment: Diana Harris is here today to follow up for her Hepatitis C infection. She started taking Epclusa ~1 month ago. She is tolerating it well without any side effects or issues. She has missed one dose but states that she takes it around 9am otherwise. She has a few days left of her first month. I gave her month 2 and 3 today so she has all of her medication at home. Reminded her to take it for 12 weeks (3 months) total and to keep up the good work taking it every day.   Verified that she is taking only 40 mg of the Lipitor while  on Epclusa. She brought it with her today and verified that she is taking a 1/2 tab of the 80 mg. She received her first Hepatitis B vaccine on 04/04/22. This was brand name Engerix, which is the 3-dose series. We have the Heplisav B vaccine here, which is typically the 2-dose series. She can receive another dose of Heplisav B when she sees Diana Harris in March. Will check Hep C RNA and LFTs today. Asked her to reach out if she needs anything in the meantime. She had a great Christmas with her 16 grandchildren.  Plan: - Continue Epclusa for a total of 12 weeks - Hep C RNA and CMET today - Heplisab B vaccine - F/u with Diana Harris for EOT visit on 09/06/22  Diana Harris L. Jannette Fogo, PharmD, BCIDP, AAHIVP, CPP Clinical Pharmacist Practitioner Infectious Diseases Clinical Pharmacist [facility] 06/28/2022, 10:55 AM

## 2022-06-28 NOTE — Progress Notes (Deleted)
    SUBJECTIVE:   CHIEF COMPLAINT / HPI:  No chief complaint on file.   Since last visit, she has had a CT abdomen pelvis which showed a large complex mass concerning for ovarian malignancy.  Patient was referred to gynecology/oncology.  PERTINENT  PMH / PSH: CVA, hepatitis C, tobacco use  Patient Care Team: Littie Deeds, MD as PCP - General (Family Medicine)   OBJECTIVE:   There were no vitals taken for this visit.  Physical Exam      05/16/2022    2:38 PM  Depression screen PHQ 2/9  Decreased Interest 0  Down, Depressed, Hopeless 0  PHQ - 2 Score 0  Altered sleeping 0  Tired, decreased energy 1  Change in appetite 0  Feeling bad or failure about yourself  0  Trouble concentrating 0  Moving slowly or fidgety/restless 0  Suicidal thoughts 0  PHQ-9 Score 1  Difficult doing work/chores Not difficult at all     {Show previous vital signs (optional):23777}  {Labs  Heme  Chem  Endocrine  Serology  Results Review (optional):23779}  ASSESSMENT/PLAN:   No problem-specific Assessment & Plan notes found for this encounter.    No follow-ups on file.   Littie Deeds, MD [facility]

## 2022-07-01 LAB — HEPATITIS C RNA QUANTITATIVE
HCV Quantitative Log: <1.18 log IU/mL
HCV RNA, PCR, QN: <15 IU/mL

## 2022-07-01 LAB — COMPREHENSIVE METABOLIC PANEL
AG Ratio: 1.3 (calc) (ref 1.0–2.5)
ALT: 13 U/L (ref 6–29)
AST: 18 U/L (ref 10–35)
Albumin: 3.9 g/dL (ref 3.6–5.1)
Alkaline phosphatase (APISO): 57 U/L (ref 37–153)
BUN: 17 mg/dL (ref 7–25)
CO2: 28 mmol/L (ref 20–32)
Calcium: 9.2 mg/dL (ref 8.6–10.4)
Chloride: 106 mmol/L (ref 98–110)
Creat: 0.88 mg/dL (ref 0.50–1.03)
Globulin: 3 g/dL (calc) (ref 1.9–3.7)
Glucose, Bld: 78 mg/dL (ref 65–99)
Potassium: 4 mmol/L (ref 3.5–5.3)
Sodium: 139 mmol/L (ref 135–146)
Total Bilirubin: 0.6 mg/dL (ref 0.2–1.2)
Total Protein: 6.9 g/dL (ref 6.1–8.1)

## 2022-07-11 ENCOUNTER — Other Ambulatory Visit (HOSPITAL_COMMUNITY): Payer: Self-pay

## 2022-08-22 IMAGING — MR MR HEAD W/O CM
11 of 12 series · 44 of 48 positions shown · non-contrast
Comparison: MRI brain 8015, correlation made with CT earlier same
day

CLINICAL DATA: TIA, slurring words and left-sided weakness

EXAM:
MRI HEAD WITHOUT CONTRAST
TECHNIQUE: Multiplanar, multiecho pulse sequences of the brain and surrounding
structures were obtained without intravenous contrast.

[Series 5: DWI · axial · 3.0mm · 0.88mm/px · z∈[-132,-6]mm · 9 of 96 slices shown (1 of 4)]
[im 1/96]
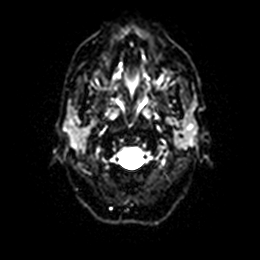
[im 12/96]
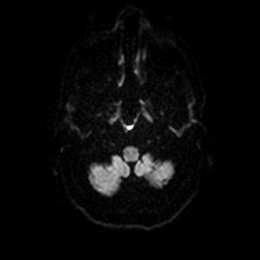
[im 24/96]
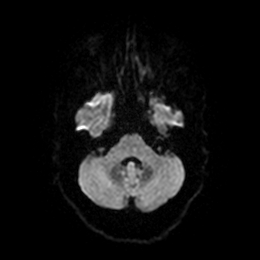
[im 36/96]
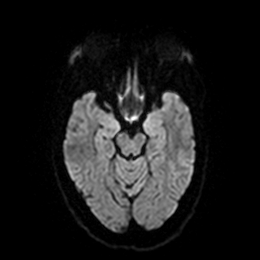
[im 48/96]
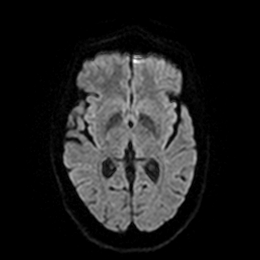
[im 60/96]
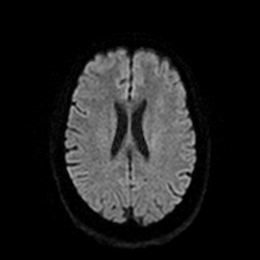
[im 72/96]
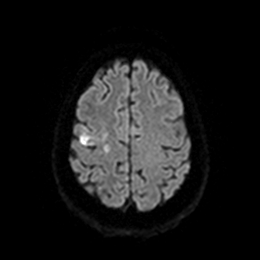
[im 84/96]
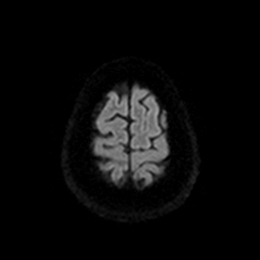
[im 96/96]
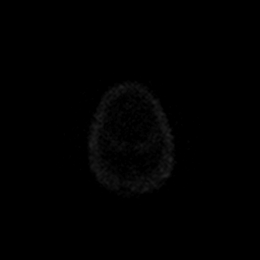

[Series 6: DWI · axial · 3.0mm · 0.88mm/px · z∈[-132,-6]mm · 5 of 48 slices shown (2 of 4)]
[im 1/48]
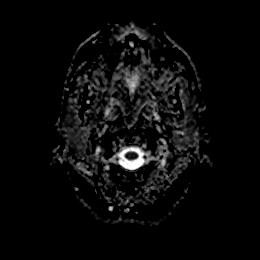
[im 12/48]
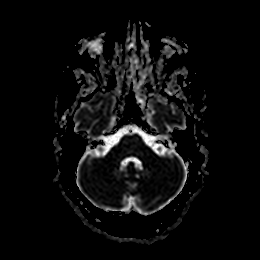
[im 24/48]
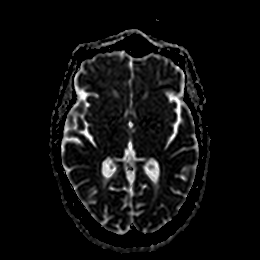
[im 36/48]
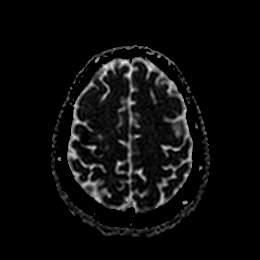
[im 48/48]
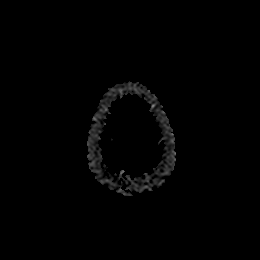

[Series 7: DWI · coronal · 4.0mm · 0.88mm/px · 6 of 72 slices shown (3 of 4)]
[im 1/72]
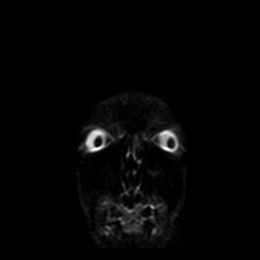
[im 15/72]
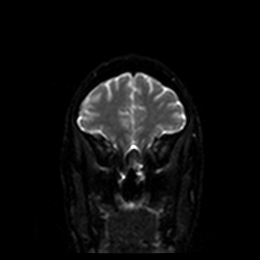
[im 29/72]
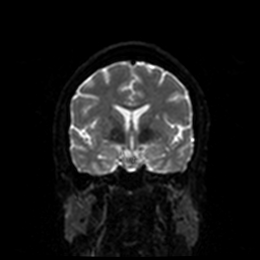
[im 43/72]
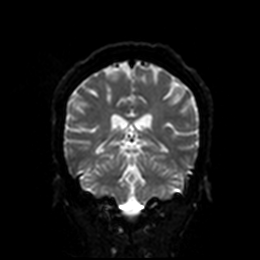
[im 57/72]
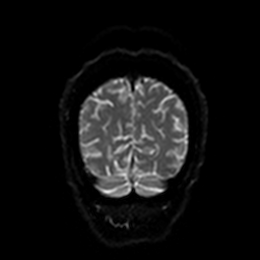
[im 72/72]
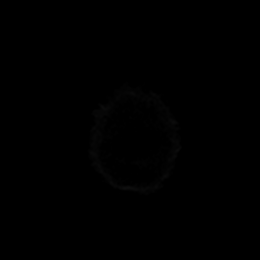

[Series 8: DWI · coronal · 4.0mm · 0.88mm/px · 3 of 36 slices shown (4 of 4)]
[im 1/36]
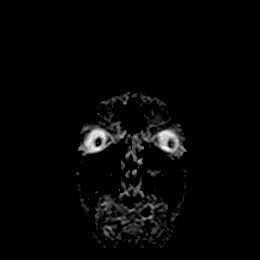
[im 18/36]
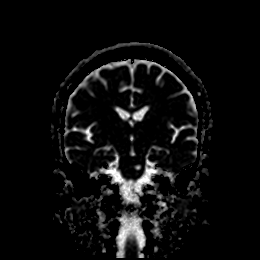
[im 36/36]
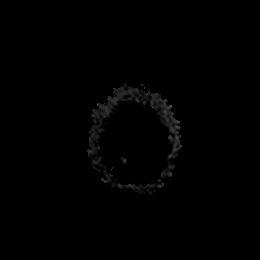

[Series 9: FLAIR · axial · 5.0mm · 0.45mm/px · z∈[-131,-2]mm · 2 of 25 slices shown]
[im 1/25]
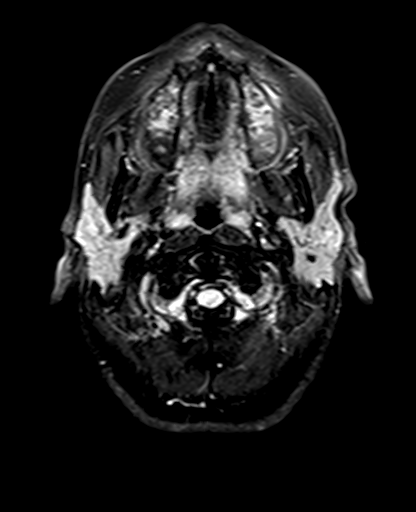
[im 25/25]
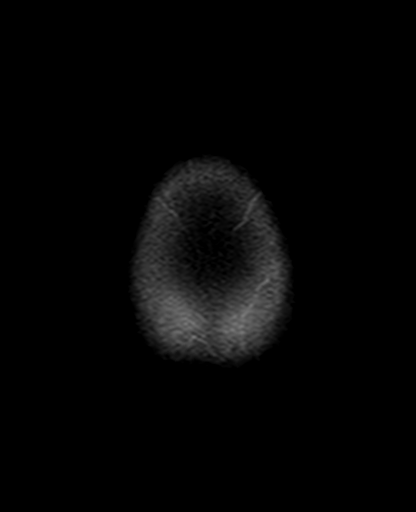

[Series 10: mag_images · axial · 3.0mm · 0.90mm/px · z∈[-135,+2]mm · 4 of 52 slices shown]
[im 1/52]
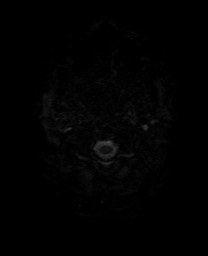
[im 18/52]
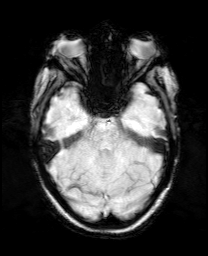
[im 35/52]
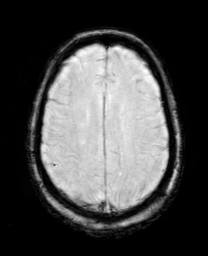
[im 52/52]
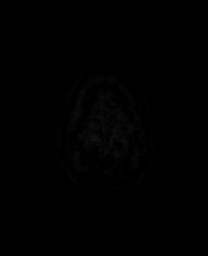

[Series 12: swi_images · axial · 3.0mm · 0.90mm/px · z∈[-135,+2]mm · 4 of 52 slices shown]
[im 1/52]
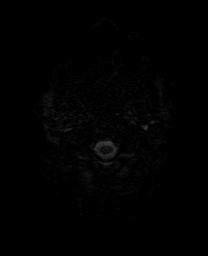
[im 18/52]
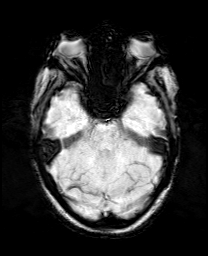
[im 35/52]
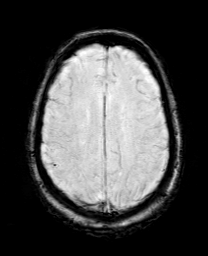
[im 52/52]
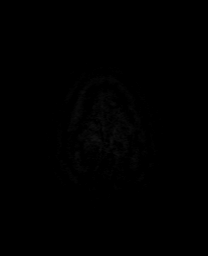

[Series 13: mip_images(sw) · axial · 24.0mm · 0.90mm/px · z∈[-126,-7]mm · 4 of 45 slices shown]
[im 1/45]
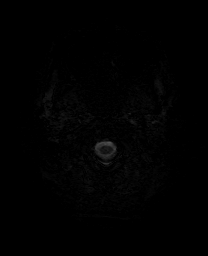
[im 15/45]
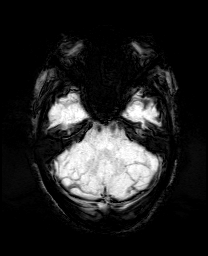
[im 30/45]
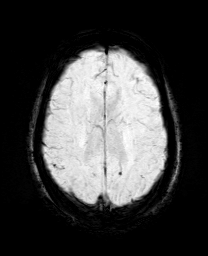
[im 45/45]
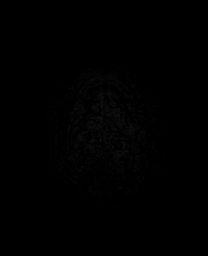

[Series 14: T1 · sagittal · 5.0mm · 0.75mm/px · 2 of 23 slices shown]
[im 1/23]
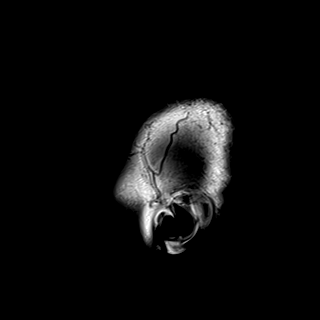
[im 23/23]
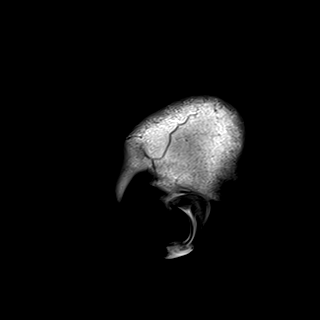

[Series 15: T2 · axial · 5.0mm · 0.72mm/px · z∈[-131,-2]mm · 2 of 25 slices shown (1 of 2)]
[im 1/25]
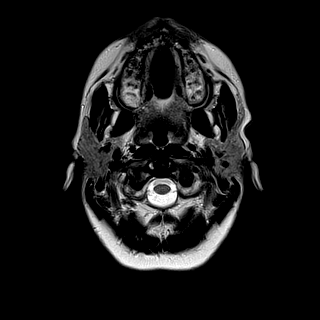
[im 25/25]
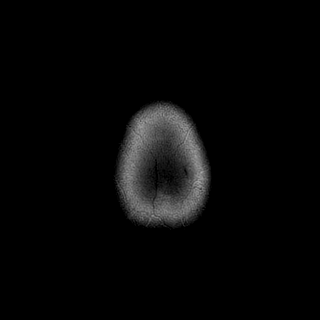

[Series 17: T2 · coronal · 5.0mm · 0.34mm/px · 3 of 30 slices shown (2 of 2)]
[im 1/30]
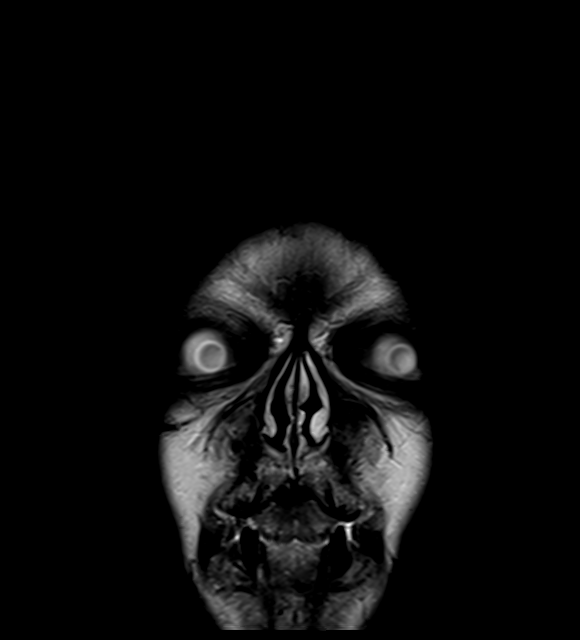
[im 15/30]
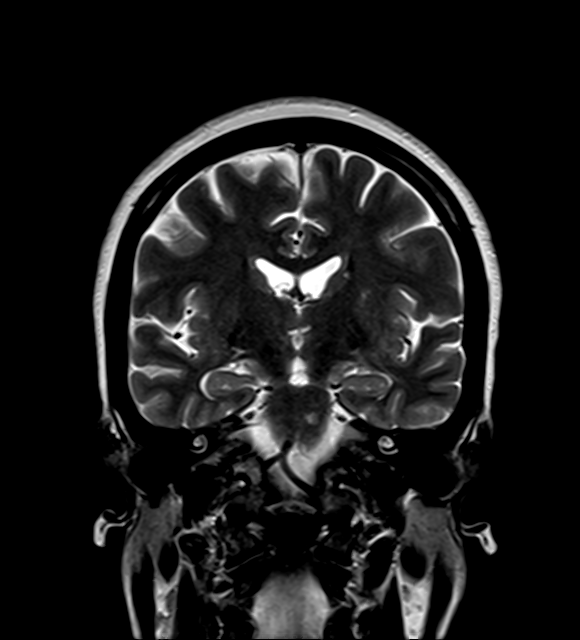
[im 30/30]
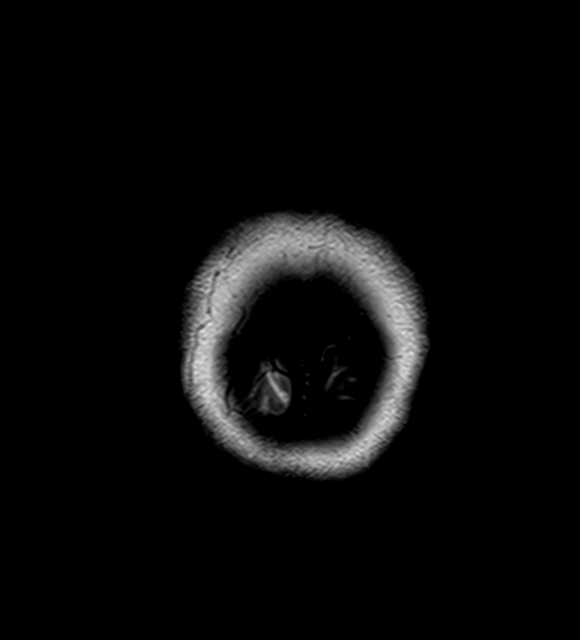

[44 of 48 positions shown; findings below may reference images not displayed]

FINDINGS: Brain: There are foci of diffusion hyperintensity with mixed EDC
involving the right precentral gyrus and adjacent centrum semiovale.

Chronic left pontine infarct. Chronic parasagittal right occipital
infarct. Additional few small foci of T2 hyperintensity in the
supratentorial white matter nonspecific but may reflect minor
chronic microvascular ischemic changes. Focus of susceptibility in
the parasagittal left parietal subcortical white matter is most
compatible with chronic microhemorrhage.

No mass effect. No hydrocephalus or extra-axial collection.
Ventricles are normal in size and configuration.

Vascular: Major vessel flow voids at the skull base are preserved.

Skull and upper cervical spine: Normal marrow signal is preserved.

Sinuses/Orbits: Paranasal sinuses are aerated. Orbits are
unremarkable.

Other: Sella is unremarkable.  Mastoid air cells are clear.
IMPRESSION: Areas of acute, subacute, and chronic infarction along the right
precentral gyrus and adjacent centrum semiovale.

Chronic left pontine and right occipital infarcts.

## 2022-09-06 ENCOUNTER — Ambulatory Visit (INDEPENDENT_AMBULATORY_CARE_PROVIDER_SITE_OTHER): Payer: BC Managed Care – PPO | Admitting: Infectious Diseases

## 2022-09-06 ENCOUNTER — Encounter: Payer: Self-pay | Admitting: Infectious Diseases

## 2022-09-06 ENCOUNTER — Other Ambulatory Visit: Payer: Self-pay

## 2022-09-06 VITALS — BP 134/81 | HR 76 | Temp 97.7°F | Ht 62.0 in | Wt 137.0 lb

## 2022-09-06 DIAGNOSIS — B182 Chronic viral hepatitis C: Secondary | ICD-10-CM | POA: Diagnosis not present

## 2022-09-06 DIAGNOSIS — Z23 Encounter for immunization: Secondary | ICD-10-CM

## 2022-09-06 NOTE — Assessment & Plan Note (Signed)
Diana Harris has completed her hepatitis c treatment with 12 week course of Epclusa once daily x 12 weeks recently. She did very good taking her medication as prescribed and had zero side effects to this.  Good treatment response with HCV RNA. Will have her back in 66mfor SVR check.   Will plan to repeat her liver U/S as well with history of hepatitis steatosis to monitor for any changes.  Her liver function tests have normalized.   Complete hep b series today with final dose of heplisav.

## 2022-09-06 NOTE — Patient Instructions (Signed)
Nice to see you! Glad you are all done!  Next Steps:  Schedule a return visit in late June so we can see you again for one more lab check. Will also discuss repeating a liver ultrasound to see any changes after your treatment.   If all checks out OK and your hep c has been cured you won't need to come back here for care anymore!  We gave you your last Hepatitis B vaccine today.

## 2022-09-06 NOTE — Progress Notes (Signed)
Patient Name: Diana Harris  Date of Birth: [birth year]  MRN: [id number]  PCP: Zola Button, MD  Referring Provider: Zola Button, MD, Ph#: [phone]    CC:  Hep C FU visit   HPI/ROS:  Diana Harris is a 57 y.o. female here for evaluation of treatment for Hepatitis C infection.   Diana Harris has not bene tested for hepatitis c virus before to her knowledge.   Medication: Epclusa x 12 weeks  Start Date: 05/31/22  Hepatitis C Genotype: 1a  Fibrosis Score: F1/F2  Hepatitis C RNA: 913,000 on 03/14/22   LOV with pharmacy team in December 2023 - VL at that time < 15 copies. Here today for EOT visit after completing her epclusa  Does not drink alcohol regu   Review of Systems  Constitutional:  Negative for appetite change, fatigue, fever and unexpected weight change.  Respiratory:  Negative for shortness of breath.   Cardiovascular:  Negative for chest pain and leg swelling.  Gastrointestinal:  Negative for abdominal pain, blood in stool, nausea and vomiting.  Genitourinary:  Negative for difficulty urinating and hematuria.  Musculoskeletal:  Negative for arthralgias.  Skin:  Negative for color change.  Neurological:  Negative for dizziness, tremors and headaches.  Hematological:  Negative for adenopathy.  Psychiatric/Behavioral:  Negative for confusion.    All other systems reviewed and are negative        Past Medical History:  Diagnosis Date   Ischemic stroke ([place]) 08/22/2017   Right posterior occipital ischemic stroke/notes 08/22/2017   Tobacco abuse     Prior to Admission medications   Medication Sig Start Date End Date Taking? Authorizing Provider  aspirin EC 325 MG tablet Take 1 tablet (325 mg total) by mouth daily. 02/14/22  Yes Alcus Dad, MD  atorvastatin (LIPITOR) 80 MG tablet Take 1 tablet (80 mg total) by mouth daily. 02/14/22  Yes Alcus Dad, MD    No Known Allergies  Social History   Tobacco Use   Smoking status: Every Day     Packs/day: 0.25    Years: 40.00    Total pack years: 10.00    Types: Cigarettes   Smokeless tobacco: Never   Tobacco comments:    Trying to cut back   Vaping Use   Vaping Use: Never used  Substance Use Topics   Alcohol use: Not Currently    Alcohol/week: 6.0 standard drinks of alcohol    Types: 6 Cans of beer per week    Comment: about 1 beer a day   Drug use: Not Currently    Types: "Crack" cocaine    Comment: Hx of cocaine use (smoked)    Family History  Problem Relation Age of Onset   Hypertension Mother    Hypertension Father    Lung cancer Sister    Prostate cancer Brother      Objective:   Vitals:   09/06/22 1348  BP: 134/81  Pulse: 76  Temp: 97.7 F (36.5 C)  SpO2: 95%   Constitutional: in no apparent distress and oriented times 3 Eyes: anicteric Cardiovascular: Cor RRR Respiratory: clear Gastrointestinal: Bowel sounds are normal, liver is not enlarged, spleen is not enlarged Musculoskeletal: peripheral pulses normal, no pedal edema, no clubbing or cyanosis Skin: negative for - jaundice, spider hemangioma, telangiectasia, palmar erythema, ecchymosis and atrophy; no porphyria cutanea tarda Lymphatic: no cervical lymphadenopathy   Laboratory: Genotype:  Lab Results  Component Value Date   HCVGENOTYPE 1a 04/19/2022   HCV viral load: No results  found for: "HCVQUANT" Lab Results  Component Value Date   WBC 3.7 03/29/2022   HGB 13.3 03/29/2022   HCT 38.8 03/29/2022   MCV 94 03/29/2022   PLT 189 03/29/2022    Lab Results  Component Value Date   CREATININE 0.88 06/28/2022   BUN 17 06/28/2022   NA 139 06/28/2022   K 4.0 06/28/2022   CL 106 06/28/2022   CO2 28 06/28/2022    Lab Results  Component Value Date   ALT 13 06/28/2022   AST 18 06/28/2022   GGT 73 (H) 04/19/2022   ALKPHOS 77 03/29/2022    Lab Results  Component Value Date   INR 1.0 04/19/2022   BILITOT 0.6 06/28/2022   ALBUMIN 3.6 (L) 03/29/2022   Imaging:  Elastography  04/2022 - hepatic steatosis, kPcal < 5   Assessment & Plan:   Problem List Items Addressed This Visit       Unprioritized   Hepatitis C infection - Primary    Diana Harris has completed her hepatitis c treatment with 12 week course of Epclusa once daily x 12 weeks recently. She did very good taking her medication as prescribed and had zero side effects to this.  Good treatment response with HCV RNA. Will have her back in 29mfor SVR check.   Will plan to repeat her liver U/S as well with history of hepatitis steatosis to monitor for any changes.  Her liver function tests have normalized.   Complete hep b series today with final dose of heplisav.       Relevant Orders   Heplisav-B (HepB-CPG) Vaccine (Completed)   Other Visit Diagnoses     Need for vaccination against hepatitis B virus       Relevant Orders   Heplisav-B (HepB-CPG) Vaccine (Completed)      SJanene Madeira MSN, NP-C [facility] [email or web address] Pager: [phone]: [phone] [phone]

## 2022-12-27 ENCOUNTER — Encounter: Payer: Self-pay | Admitting: Infectious Diseases

## 2022-12-27 ENCOUNTER — Other Ambulatory Visit: Payer: Self-pay

## 2022-12-27 ENCOUNTER — Other Ambulatory Visit (HOSPITAL_COMMUNITY): Payer: Self-pay

## 2022-12-27 ENCOUNTER — Ambulatory Visit (INDEPENDENT_AMBULATORY_CARE_PROVIDER_SITE_OTHER): Payer: Medicaid Other | Admitting: Infectious Diseases

## 2022-12-27 VITALS — BP 145/84 | HR 61 | Temp 98.2°F | Resp 16 | Wt 138.2 lb

## 2022-12-27 DIAGNOSIS — R140 Abdominal distension (gaseous): Secondary | ICD-10-CM

## 2022-12-27 DIAGNOSIS — B182 Chronic viral hepatitis C: Secondary | ICD-10-CM | POA: Diagnosis not present

## 2022-12-27 DIAGNOSIS — R1905 Periumbilic swelling, mass or lump: Secondary | ICD-10-CM

## 2022-12-27 NOTE — Progress Notes (Signed)
Patient Name: Diana Harris  Date of Birth: 20-Dec-1965  MRN: [id number]  PCP: Littie Deeds, MD  Referring Provider: Littie Deeds, MD, Ph#: [phone]    CC:  Hep C cure visit.    HPI/ROS:  Diana Harris is a 57 y.o. female here for evaluation of treatment for Hepatitis C infection.   Ms Ritchie has not bene tested for hepatitis c virus before to her knowledge.   Medication: Epclusa x 12 weeks  Start Date: 05/31/22 - completed March 2023  Hepatitis C Genotype: 1a  Fibrosis Score: F1/F2  Hepatitis C RNA: 913,000 on 03/14/22    HPI: Doing well since last office visit together at EOT for [facility]. She completed Epclusa 3 months ago now. Her main concern today is worsening abdominal bloating and distension. Feels like a sharp pain occasionally that comes quick and goes away quick. No associated diet changes, appetite changes, scale is the same.  She has a strong family history of breast cancer with multiple family members.  No ETOH use.    Review of Systems  Constitutional:  Negative for appetite change, fatigue, fever and unexpected weight change.  Respiratory:  Negative for shortness of breath.   Cardiovascular:  Negative for chest pain and leg swelling.  Gastrointestinal:  Negative for abdominal pain, blood in stool, nausea and vomiting.  Genitourinary:  Negative for difficulty urinating and hematuria.  Musculoskeletal:  Negative for arthralgias.  Skin:  Negative for color change.  Neurological:  Negative for dizziness, tremors and headaches.  Hematological:  Negative for adenopathy.  Psychiatric/Behavioral:  Negative for confusion.    All other systems reviewed and are negative        Past Medical History:  Diagnosis Date   Ischemic stroke (H[facility]) 08/22/2017   Right posterior occipital ischemic stroke/notes 08/22/2017   Tobacco abuse     Prior to Admission medications   Medication Sig Start Date End Date Taking? Authorizing Provider  aspirin EC 325 MG  tablet Take 1 tablet (325 mg total) by mouth daily. 02/14/22  Yes Maury Dus, MD  atorvastatin (LIPITOR) 80 MG tablet Take 1 tablet (80 mg total) by mouth daily. 02/14/22  Yes Maury Dus, MD    No Known Allergies  Social History   Tobacco Use   Smoking status: Every Day    Packs/day: 0.25    Years: 40.00    Additional pack years: 0.00    Total pack years: 10.00    Types: Cigarettes   Smokeless tobacco: Never   Tobacco comments:    Trying to cut back   Vaping Use   Vaping Use: Never used  Substance Use Topics   Alcohol use: Not Currently    Alcohol/week: 6.0 standard drinks of alcohol    Types: 6 Cans of beer per week    Comment: about 1 beer a day   Drug use: Not Currently    Types: "Crack" cocaine    Comment: Hx of cocaine use (smoked)    Family History  Problem Relation Age of Onset   Hypertension Mother    Hypertension Father    Lung cancer Sister    Prostate cancer Brother      Objective:   Vitals:   12/27/22 1352  BP: (!) 145/84  Pulse: 61  Resp: 16  Temp: 98.2 F (36.8 C)  SpO2: 98%   Constitutional: in no apparent distress and oriented times 3 Eyes: anicteric Cardiovascular: Cor RRR Respiratory: clear Gastrointestinal: Abdomen is taught and full. Umbilical hernia observed. Bowel  sounds are normal, liver is not enlarged, spleen is not enlarged. No fluid wave appreciated. No abdominal wall varicosities.  Musculoskeletal: peripheral pulses normal, no pedal edema, no clubbing or cyanosis Skin: negative for - jaundice, spider hemangioma, telangiectasia, palmar erythema, ecchymosis and atrophy; no porphyria cutanea tarda Lymphatic: no cervical lymphadenopathy   Laboratory: Genotype:  Lab Results  Component Value Date   HCVGENOTYPE 1a 04/19/2022   HCV viral load: No results found for: "HCVQUANT" Lab Results  Component Value Date   WBC 3.7 03/29/2022   HGB 13.3 03/29/2022   HCT 38.8 03/29/2022   MCV 94 03/29/2022   PLT 189 03/29/2022     Lab Results  Component Value Date   CREATININE 0.88 06/28/2022   BUN 17 06/28/2022   NA 139 06/28/2022   K 4.0 06/28/2022   CL 106 06/28/2022   CO2 28 06/28/2022    Lab Results  Component Value Date   ALT 13 06/28/2022   AST 18 06/28/2022   GGT 73 (H) 04/19/2022   ALKPHOS 77 03/29/2022    Lab Results  Component Value Date   INR 1.0 04/19/2022   BILITOT 0.6 06/28/2022   ALBUMIN 3.6 (L) 03/29/2022   Imaging:  Elastography 04/2022 - hepatic steatosis, kPcal < 5   Assessment & Plan:   Problem List Items Addressed This Visit       Unprioritized   Abdominal mass    Significant abdominal bloating / distension. No weight changes or altered BM patterns/habits. She does not get her menses any longer and has not for years now.  I see she has a history of an abdominal mass per Dr. Chase Caller notes previously. On exam today she has a taught abdomen that is non-painful. No abdominal wall varicosities or signs of any ascites. Small reducible umbilical hernia.  Will work on getting her access to Medicaid today (which was approved!) and schedule CT scan and GI referral for work up / colonoscopy screening.  Will message her PCP as well with update.  With family h/o GYN/Breast cancers will check CA125, bHCG, CMP.       Hepatitis C infection    Cure visit today for SVR following 12 weeks of Epclusa use. Will call her with results but outside of general liver health/care recommendations she needs no special screenings going forward given she did not have any severe fibrosis or cirrhosis.       Relevant Orders   Hepatitis C RNA quantitative ([facility])   Hepatic function panel   AFP tumor marker   B-HCG Quant   Other Visit Diagnoses     Abdominal bloating    -  Primary   Relevant Orders   Ambulatory referral to Gastroenterology   Beta hCG quant (ref lab)   CA 125   AFP tumor marker   B-HCG Quant   CT ABDOMEN PELVIS W CONTRAST       Rexene Alberts, MSN, NP-C [facility]  [email or web address] Pager: [phone] Office: [phone] [facility] Main Line: [phone]

## 2022-12-27 NOTE — Assessment & Plan Note (Signed)
Significant abdominal bloating / distension. No weight changes or altered BM patterns/habits. She does not get her menses any longer and has not for years now.  I see she has a history of an abdominal mass per Dr. Chase Caller notes previously. On exam today she has a taught abdomen that is non-painful. No abdominal wall varicosities or signs of any ascites. Small reducible umbilical hernia.  Will work on getting her access to Medicaid today (which was approved!) and schedule CT scan and GI referral for work up / colonoscopy screening.  Will message her PCP as well with update.  With family h/o GYN/Breast cancers will check CA125, bHCG, CMP.

## 2022-12-27 NOTE — Patient Instructions (Addendum)
Will call you with your lab results when they come back.   Will plan to refer you to [facility] team for colon cancer screening and to help with your bloating evaluation.  Address: [address], [place], [place] [address] Phone: [phone]   I would also ask you to make an appointment with Dr. Wynelle Link to see if they can help as well.

## 2022-12-27 NOTE — Assessment & Plan Note (Signed)
Cure visit today for SVR following 12 weeks of Epclusa use. Will call her with results but outside of general liver health/care recommendations she needs no special screenings going forward given she did not have any severe fibrosis or cirrhosis.

## 2022-12-30 LAB — HEPATIC FUNCTION PANEL
AG Ratio: 1.6 (calc) (ref 1.0–2.5)
ALT: 16 U/L (ref 6–29)
AST: 15 U/L (ref 10–35)
Albumin: 4.1 g/dL (ref 3.6–5.1)
Alkaline phosphatase (APISO): 54 U/L (ref 37–153)
Bilirubin, Direct: 0.1 mg/dL (ref 0.0–0.2)
Globulin: 2.6 g/dL (calc) (ref 1.9–3.7)
Indirect Bilirubin: 0.4 mg/dL (calc) (ref 0.2–1.2)
Total Bilirubin: 0.5 mg/dL (ref 0.2–1.2)
Total Protein: 6.7 g/dL (ref 6.1–8.1)

## 2022-12-30 LAB — HEPATITIS C RNA QUANTITATIVE
HCV Quantitative Log: <1.18 log IU/mL
HCV RNA, PCR, QN: <15 IU/mL

## 2022-12-30 LAB — HCG, QUANTITATIVE, PREGNANCY: HCG, Total, QN: <5 m[IU]/mL

## 2022-12-30 LAB — AFP TUMOR MARKER: AFP-Tumor Marker: 6.4 ng/mL — ABNORMAL HIGH

## 2022-12-30 LAB — CA 125: CA 125: 8 U/mL (ref <35)

## 2023-01-01 ENCOUNTER — Other Ambulatory Visit: Payer: Self-pay | Admitting: Family Medicine

## 2023-01-01 ENCOUNTER — Telehealth: Payer: Self-pay

## 2023-01-01 DIAGNOSIS — Z419 Encounter for procedure for purposes other than remedying health state, unspecified: Secondary | ICD-10-CM | POA: Diagnosis not present

## 2023-01-01 DIAGNOSIS — R1900 Intra-abdominal and pelvic swelling, mass and lump, unspecified site: Secondary | ICD-10-CM

## 2023-01-01 NOTE — Telephone Encounter (Signed)
-----   Message from Littie Deeds, MD sent at 12/28/2022  1:04 PM EDT ----- Regarding: GYN appt Could one of you please help to schedule this patient's GYN appointment? Her referral was closed as she didn't answer her phone.. thanks in advance.  [facility] Address: [address], [place], [place] [address] Phone: [phone]

## 2023-01-01 NOTE — Telephone Encounter (Signed)
Attempted to reach the GYN office. No answer. LVM for the office to call me. To try and make an appt for the patient. Due to pelvic mass. Aquilla Solian, CMA

## 2023-01-02 ENCOUNTER — Telehealth: Payer: Self-pay

## 2023-01-02 NOTE — Telephone Encounter (Signed)
Spoke with patient informed of GYN appt at [facility] [address] July 9th at 9:35am. Patient understood and took down information.  Aquilla Solian, CMA

## 2023-01-02 NOTE — Telephone Encounter (Signed)
-----   Message from Vonna Drafts, MD sent at 01/01/2023  3:08 PM EDT ----- Regarding: RE: GYN appt I have placed a new urgent referral to Gyn with instructions to try a different facility other than [facility]  Thanks atif ----- Message ----- From: Aquilla Solian, CMA Sent: 01/01/2023  12:51 PM EDT To: Vonna Drafts, MD Subject: FW: GYN appt                                   A [facility] Rep. Called me back and said that they are booked out until October. She also mentioned that the providers there or leaving basically. And that they are referring out to PA's for their patients, Didn't know what you wanted to do next.  ----- Message ----- From: Littie Deeds, MD Sent: 12/28/2022   1:07 PM EDT To: Bevelyn Ngo Blue Pool Subject: GYN appt                                       Could one of you please help to schedule this patient's GYN appointment? Her referral was closed as she didn't answer her phone.. thanks in advance.  [facility] Address: [address], [place], [place] [address] Phone: [phone]

## 2023-01-02 NOTE — Telephone Encounter (Signed)
Spoke with patient and informed of GYN appt July 9th at 9:35am. [address], [place] . Patient understood . Diana Harris, CMA

## 2023-01-19 ENCOUNTER — Ambulatory Visit (HOSPITAL_COMMUNITY): Payer: Medicaid Other

## 2023-01-24 ENCOUNTER — Ambulatory Visit (HOSPITAL_BASED_OUTPATIENT_CLINIC_OR_DEPARTMENT_OTHER)
Admission: RE | Admit: 2023-01-24 | Discharge: 2023-01-24 | Disposition: A | Discharge status: Home / Self Care | Payer: Medicaid Other | Source: Ambulatory Visit | Attending: Infectious Diseases | Admitting: Infectious Diseases

## 2023-01-24 DIAGNOSIS — K7689 Other specified diseases of liver: Secondary | ICD-10-CM | POA: Diagnosis not present

## 2023-01-24 DIAGNOSIS — R140 Abdominal distension (gaseous): Secondary | ICD-10-CM | POA: Insufficient documentation

## 2023-01-24 DIAGNOSIS — K429 Umbilical hernia without obstruction or gangrene: Secondary | ICD-10-CM | POA: Diagnosis not present

## 2023-01-24 DIAGNOSIS — D259 Leiomyoma of uterus, unspecified: Secondary | ICD-10-CM | POA: Diagnosis not present

## 2023-01-24 MED ORDER — IOHEXOL 300 MG/ML  SOLN
100.0000 mL | Freq: Once | INTRAMUSCULAR | Status: AC | PRN
  Administered 2023-01-24: 75 mL via INTRAVENOUS

## 2023-01-31 NOTE — Progress Notes (Signed)
Please call Diana Harris to let her know that her CT scan shows an ongoing large mass in her abdomen that was seen back in November. This is why she feels pregnant and bloated all the time. The mass may be coming from her ovary from what the radiology team suspected.   She has an upcoming appointment with gynecology August 9th with Dr. Shawnie Pons to evaluate this further - please remind her of that appointment details so she follow up this result and they can help to figure out what the mass is.

## 2023-02-01 ENCOUNTER — Telehealth: Payer: Self-pay

## 2023-02-01 NOTE — Telephone Encounter (Signed)
-----   Message from Berkley sent at 01/31/2023  5:30 PM EDT ----- Please call Diana Harris to let her know that her CT scan shows an ongoing large mass in her abdomen that was seen back in November. This is why she feels pregnant and bloated all the time. The mass may be coming from her ovary from what the radiology team suspected.   She has an upcoming appointment with gynecology August 9th with Dr. Shawnie Pons to evaluate this further - please remind her of that appointment details so she follow up this result and they can help to figure out what the mass is.

## 2023-02-01 NOTE — Telephone Encounter (Signed)
Spoke with Diana Harris, relayed result note from Rexene Alberts, NP. Reminded her of appointment on 8/9 and provided her with OBGYN phone number to confirm location. Patient verbalized understanding and has no further questions.   Sandie Ano, RN

## 2023-02-09 ENCOUNTER — Encounter: Payer: Medicaid Other | Admitting: Family Medicine

## 2023-03-06 ENCOUNTER — Ambulatory Visit: Payer: Medicaid Other | Admitting: Obstetrics and Gynecology

## 2023-03-06 ENCOUNTER — Encounter: Payer: Self-pay | Admitting: Obstetrics and Gynecology

## 2023-03-06 ENCOUNTER — Other Ambulatory Visit: Payer: Self-pay

## 2023-03-06 VITALS — BP 138/89 | HR 65 | Wt 139.0 lb

## 2023-03-06 DIAGNOSIS — R1900 Intra-abdominal and pelvic swelling, mass and lump, unspecified site: Secondary | ICD-10-CM | POA: Insufficient documentation

## 2023-03-06 NOTE — Progress Notes (Signed)
Diana Harris presents for evaluation of pelvic mass. Has been present for almost a yr now.  W/U has included labs and CT scans. Last CT scan uncertain if uterine or ovarian in origin.   She thinks it might be getting bigger She denies any bowel or bladder dysfunction Not sexual active  H/O CVA x 2.   PE AF VSS Lungs clear Heart RRR Abd soft, + BS ,abd/pelvic mass affect noted, @ 18 weeks.  A/P Pelvic mass  Discussed with pt. Since last CT uncertain if uterine vs ovarian. Will check MRI of pelvis F/U to discuss results and further Tx options as per test results.

## 2023-03-13 ENCOUNTER — Ambulatory Visit (HOSPITAL_COMMUNITY): Payer: Medicaid Other

## 2023-03-23 ENCOUNTER — Ambulatory Visit (HOSPITAL_COMMUNITY)
Admission: RE | Admit: 2023-03-23 | Discharge: 2023-03-23 | Disposition: A | Discharge status: Home / Self Care | Payer: Medicaid Other | Source: Ambulatory Visit | Attending: Obstetrics and Gynecology | Admitting: Obstetrics and Gynecology

## 2023-03-23 DIAGNOSIS — R1900 Intra-abdominal and pelvic swelling, mass and lump, unspecified site: Secondary | ICD-10-CM | POA: Diagnosis not present

## 2023-03-23 MED ORDER — GADOBUTROL 1 MMOL/ML IV SOLN
6.0000 mL | Freq: Once | INTRAVENOUS | Status: AC | PRN
  Administered 2023-03-23: 6 mL via INTRAVENOUS

## 2023-03-27 ENCOUNTER — Encounter (HOSPITAL_COMMUNITY): Payer: Self-pay | Admitting: Emergency Medicine

## 2023-03-27 ENCOUNTER — Ambulatory Visit (INDEPENDENT_AMBULATORY_CARE_PROVIDER_SITE_OTHER): Payer: Medicaid Other

## 2023-03-27 ENCOUNTER — Ambulatory Visit (HOSPITAL_COMMUNITY)
Admission: EM | Admit: 2023-03-27 | Discharge: 2023-03-27 | Disposition: A | Discharge status: Home / Self Care | Payer: Medicaid Other | Attending: Family Medicine | Admitting: Family Medicine

## 2023-03-27 DIAGNOSIS — M79674 Pain in right toe(s): Secondary | ICD-10-CM

## 2023-03-27 DIAGNOSIS — G8929 Other chronic pain: Secondary | ICD-10-CM

## 2023-03-27 MED ORDER — IBUPROFEN 800 MG PO TABS: 800.0000 mg | ORAL_TABLET | Freq: Three times a day (TID) | ORAL | 0 refills | Status: AC | PRN

## 2023-03-27 NOTE — ED Triage Notes (Signed)
Pt right foot middle toe swollen and painful that started 3 weeks ago. Pt states she hit toe two years ago at work and had off and on pain since.

## 2023-03-27 NOTE — ED Provider Notes (Signed)
[facility]    CSN: [id number] Arrival date & time: 03/27/23  1257      History   Chief Complaint Chief Complaint  Patient presents with   Toe Injury    HPI Diana Harris is a 57 y.o. female.   HPI Here for pain in her right third toe in the last 2 months or so, then it got swollen about 2-3 weeks ago. No recent trauma, but she states she hit that toe about 2 years ago at work.  No fever   no history of diabetes.   Past Medical History:  Diagnosis Date   Ischemic stroke (HCC) 08/22/2017   Right posterior occipital ischemic stroke/notes 08/22/2017   Tobacco abuse     Patient Active Problem List   Diagnosis Date Noted   Pelvic mass 03/06/2023   Poor social situation 05/16/2022   Drug interaction 04/19/2022   Alcohol use 04/19/2022   Abdominal mass 04/04/2022   Hepatitis C infection 03/25/2022   Polysubstance abuse (HCC) 02/15/2022   Poor dentition 02/14/2021   CVA (cerebral vascular accident) (HCC) 02/04/2021   Ischemic stroke (HCC) 08/22/2017   Tobacco use disorder 08/22/2017    Past Surgical History:  Procedure Laterality Date   CESAREAN SECTION     CESAREAN SECTION  1985    OB History     Gravida  4   Para  4   Term  0   Preterm  4   AB      Living  4      SAB      IAB      Ectopic      Multiple      Live Births               Home Medications    Prior to Admission medications   Medication Sig Start Date End Date Taking? Authorizing Provider  ibuprofen (ADVIL) 800 MG tablet Take 1 tablet (800 mg total) by mouth every 8 (eight) hours as needed (pain). 03/27/23  Yes Zenia Resides, MD  aspirin EC 325 MG tablet Take 1 tablet (325 mg total) by mouth daily. 05/16/22   Littie Deeds, MD  atorvastatin (LIPITOR) 80 MG tablet Take 1 tablet (80 mg total) by mouth daily. 05/16/22   Littie Deeds, MD  Sofosbuvir-Velpatasvir (EPCLUSA) 400-100 MG TABS Take 1 tablet by mouth daily. Patient not taking: Reported on 09/06/2022  04/24/22   Jennette Kettle, RPH-CPP    Family History Family History  Problem Relation Age of Onset   Hypertension Mother    Hypertension Father    Lung cancer Sister    Prostate cancer Brother     Social History Social History   Tobacco Use   Smoking status: Every Day    Current packs/day: 0.25    Average packs/day: 0.3 packs/day for 40.0 years (10.0 ttl pk-yrs)    Types: Cigarettes   Smokeless tobacco: Never   Tobacco comments:    Trying to cut back   Vaping Use   Vaping status: Never Used  Substance Use Topics   Alcohol use: Not Currently    Comment: socially   Drug use: Not Currently    Types: "Crack" cocaine    Comment: Hx of cocaine use (smoked)     Allergies   Patient has no known allergies.   Review of Systems Review of Systems   Physical Exam Triage Vital Signs ED Triage Vitals  Encounter Vitals Group     BP 03/27/23 1350 Marland Kitchen)  172/99     Systolic BP Percentile --      Diastolic BP Percentile --      Pulse Rate 03/27/23 1350 (!) 59     Resp 03/27/23 1350 17     Temp 03/27/23 1350 97.6 F (36.4 C)     Temp Source 03/27/23 1350 Oral     SpO2 03/27/23 1350 97 %     Weight --      Height --      Head Circumference --      Peak Flow --      Pain Score 03/27/23 1348 9     Pain Loc --      Pain Education --      Exclude from Growth Chart --    No data found.  Updated Vital Signs BP (!) 162/100 (BP Location: Right Arm)   Pulse (!) 59   Temp 97.6 F (36.4 C) (Oral)   Resp 17   SpO2 97%   Visual Acuity Right Eye Distance:   Left Eye Distance:   Bilateral Distance:    Right Eye Near:   Left Eye Near:    Bilateral Near:     Physical Exam Vitals reviewed.  Constitutional:      General: She is not in acute distress.    Appearance: She is not toxic-appearing.  Musculoskeletal:     Comments: There is some swelling and tenderness of the right knee third toe.  There is no erythema or ulceration.    Skin:    Coloration: Skin is not  pale.  Neurological:     General: No focal deficit present.     Mental Status: She is alert and oriented to person, place, and time.  Psychiatric:        Behavior: Behavior normal.      UC Treatments / Results  Labs (all labs ordered are listed, but only abnormal results are displayed) Labs Reviewed - No data to display  EKG   Radiology No results found.  Procedures Procedures (including critical care time)  Medications Ordered in UC Medications - No data to display  Initial Impression / Assessment and Plan / UC Course  I have reviewed the triage vital signs and the nursing notes.  Pertinent labs & imaging results that were available during my care of the patient were reviewed by me and considered in my medical decision making (see chart for details).    All sugars have been normal in epic.  There is possible old fracture at the end of her distal phalanx of the third toe.  Buddy taping is done and postop shoe was provided.  She is given contact information for podiatry.  Ibuprofen is sent in for pain relief.  Final Clinical Impressions(s) / UC Diagnoses   Final diagnoses:  Chronic toe pain, right foot     Discharge Instructions      There is some possible old fracture on your end toe bone.  Take ibuprofen 800 mg--1 tab every 8 hours as needed for pain.  Call podiatry to get in with them for them to evaluate you further.  Also follow-up with your primary care about this issue.     ED Prescriptions     Medication Sig Dispense Auth. Provider   ibuprofen (ADVIL) 800 MG tablet Take 1 tablet (800 mg total) by mouth every 8 (eight) hours as needed (pain). 21 tablet Bradey Luzier, Janace Aris, MD      I have reviewed the PDMP during this encounter.  Zenia Resides, MD 03/27/23 [phone]

## 2023-03-27 NOTE — Discharge Instructions (Signed)
There is some possible old fracture on your end toe bone.  Take ibuprofen 800 mg--1 tab every 8 hours as needed for pain.  Call podiatry to get in with them for them to evaluate you further.  Also follow-up with your primary care about this issue.

## 2023-04-02 ENCOUNTER — Other Ambulatory Visit: Payer: Self-pay | Admitting: Obstetrics and Gynecology

## 2023-04-02 DIAGNOSIS — R1900 Intra-abdominal and pelvic swelling, mass and lump, unspecified site: Secondary | ICD-10-CM

## 2023-04-02 NOTE — Progress Notes (Signed)
MRI results reviewed with pt. Results reviewed by GYN ONC as well Recommend further evaluation by their services. Pt informed of need for GYN Onc evaluation Referral placed.

## 2023-04-03 ENCOUNTER — Telehealth: Payer: Self-pay

## 2023-04-03 NOTE — Telephone Encounter (Addendum)
-----   Message from Hermina Staggers sent at 04/02/2023  3:44 PM EDT ----- Please refer pt to GYN ONC. I have spoken to pt and GYN ONC I have placed referral as well. Please cancel further appts at [facility]. Thanks Casimiro Needle     Cancelled upcoming appt with Dr. Vergie Living. Called Gyn/Onc at [facility]. They state scheduler will be back in office tomorrow and will contact patient with appt. Our office will follow up over the next few days to be sure this is scheduled.

## 2023-04-04 ENCOUNTER — Encounter: Payer: Self-pay | Admitting: Psychiatry

## 2023-04-04 ENCOUNTER — Telehealth: Payer: Self-pay

## 2023-04-04 NOTE — Telephone Encounter (Signed)
Spoke with Diana Harris regarding her referral to GYN oncology. She has an appointment scheduled with Dr. Alvester Morin on 04/09/23 at 11:15. Patient agrees to date and time. She has been provided with office address and location. She is also aware of our mask and visitor policy. Patient verbalized understanding and will call with any questions.    In reviewing SDOH questions, pt states she often worries about having enough food. She states she just signed up for food stamps. (Didn't say when this was done) Referral to social worker done.

## 2023-04-05 ENCOUNTER — Inpatient Hospital Stay: Payer: Medicaid Other | Attending: Psychiatry

## 2023-04-05 ENCOUNTER — Encounter: Payer: Self-pay | Admitting: Podiatry

## 2023-04-05 ENCOUNTER — Ambulatory Visit (INDEPENDENT_AMBULATORY_CARE_PROVIDER_SITE_OTHER): Payer: Medicaid Other | Admitting: Podiatry

## 2023-04-05 DIAGNOSIS — Z803 Family history of malignant neoplasm of breast: Secondary | ICD-10-CM | POA: Insufficient documentation

## 2023-04-05 DIAGNOSIS — F17210 Nicotine dependence, cigarettes, uncomplicated: Secondary | ICD-10-CM | POA: Insufficient documentation

## 2023-04-05 DIAGNOSIS — R772 Abnormality of alphafetoprotein: Secondary | ICD-10-CM | POA: Insufficient documentation

## 2023-04-05 DIAGNOSIS — Z7982 Long term (current) use of aspirin: Secondary | ICD-10-CM | POA: Insufficient documentation

## 2023-04-05 DIAGNOSIS — L989 Disorder of the skin and subcutaneous tissue, unspecified: Secondary | ICD-10-CM | POA: Insufficient documentation

## 2023-04-05 DIAGNOSIS — Z79899 Other long term (current) drug therapy: Secondary | ICD-10-CM | POA: Insufficient documentation

## 2023-04-05 DIAGNOSIS — Z808 Family history of malignant neoplasm of other organs or systems: Secondary | ICD-10-CM | POA: Insufficient documentation

## 2023-04-05 DIAGNOSIS — M869 Osteomyelitis, unspecified: Secondary | ICD-10-CM

## 2023-04-05 DIAGNOSIS — M2041 Other hammer toe(s) (acquired), right foot: Secondary | ICD-10-CM

## 2023-04-05 DIAGNOSIS — Z8042 Family history of malignant neoplasm of prostate: Secondary | ICD-10-CM | POA: Insufficient documentation

## 2023-04-05 DIAGNOSIS — Z8673 Personal history of transient ischemic attack (TIA), and cerebral infarction without residual deficits: Secondary | ICD-10-CM | POA: Insufficient documentation

## 2023-04-05 DIAGNOSIS — Z801 Family history of malignant neoplasm of trachea, bronchus and lung: Secondary | ICD-10-CM | POA: Insufficient documentation

## 2023-04-05 DIAGNOSIS — R1900 Intra-abdominal and pelvic swelling, mass and lump, unspecified site: Secondary | ICD-10-CM | POA: Insufficient documentation

## 2023-04-05 DIAGNOSIS — B1920 Unspecified viral hepatitis C without hepatic coma: Secondary | ICD-10-CM | POA: Insufficient documentation

## 2023-04-05 MED ORDER — AMOXICILLIN-POT CLAVULANATE 500-125 MG PO TABS: 1.0000 | ORAL_TABLET | Freq: Two times a day (BID) | ORAL | 0 refills | Status: DC

## 2023-04-05 NOTE — Progress Notes (Signed)
  Subjective:  Patient ID: Diana Harris, female    DOB: [birth year],  MRN: [id number] HPI Chief Complaint  Patient presents with   Toe Pain    3rd toe right - painful and swollen x couple months, no injury, Urgent Care xrayed, put in surgical shoe and sent here for treatment (xrays in [facility])   New Patient (Initial Visit)    57 y.o. female presents with the above complaint.   ROS: Denies fever chills nausea vomit muscle aches pains.  Past Medical History:  Diagnosis Date   Ischemic stroke (HCC) 08/22/2017   Right posterior occipital ischemic stroke/notes 08/22/2017   Tobacco abuse    Past Surgical History:  Procedure Laterality Date   CESAREAN SECTION     CESAREAN SECTION  1985    Current Outpatient Medications:    amoxicillin-clavulanate (AUGMENTIN) 500-125 MG tablet, Take 1 tablet by mouth 2 (two) times daily., Disp: 20 tablet, Rfl: 0   aspirin EC 325 MG tablet, Take 1 tablet (325 mg total) by mouth daily., Disp: 90 tablet, Rfl: 3   atorvastatin (LIPITOR) 80 MG tablet, Take 1 tablet (80 mg total) by mouth daily., Disp: 90 tablet, Rfl: 3   ibuprofen (ADVIL) 800 MG tablet, Take 1 tablet (800 mg total) by mouth every 8 (eight) hours as needed (pain)., Disp: 21 tablet, Rfl: 0  No Known Allergies Review of Systems Objective:  There were no vitals filed for this visit.  General: Well developed, nourished, in no acute distress, alert and oriented x3   Dermatological: Skin is warm, dry and supple bilateral. Nails x 10 are well maintained; remaining integument appears unremarkable at this time. There are no open sores, no preulcerative lesions, no rash or signs of infection present.  Third toe right foot is swollen and mildly erythematous tender to the touch.  No open lesions or wounds.  Vascular: Dorsalis Pedis artery and Posterior Tibial artery pedal pulses are 2/4 bilateral with immedate capillary fill time. Pedal hair growth present. No varicosities and no lower extremity edema  present bilateral.   Neruologic: Grossly intact via light touch bilateral. Vibratory intact via tuning fork bilateral. Protective threshold with Semmes Wienstein monofilament intact to all pedal sites bilateral. Patellar and Achilles deep tendon reflexes 2+ bilateral. No Babinski or clonus noted bilateral.   Musculoskeletal: No gross boney pedal deformities bilateral. No pain, crepitus, or limitation noted with foot and ankle range of motion bilateral. Muscular strength 5/5 in all groups tested bilateral.  Gait: Unassisted, Nonantalgic.    Radiographs:  Radiographs reviewed from urgent care demonstrates some osteolysis at the proximal lateral aspect of the distal phalanx very well could be osteomyelitis or osteolysis.  Assessment & Plan:   Assessment: Possible cellulitis osteomyelitis third digit right foot  Plan: Started her on Augmentin today 875 1 p.o. twice daily continue use of the [employer] shoe     Lorraine Terriquez T. [place], [place]

## 2023-04-05 NOTE — Progress Notes (Signed)
[facility] Clinical Social Work  Clinical Social Work was referred by medical provider for assessment of psychosocial needs. Clinical Social Worker contacted patient by phone to offer support and assess for needs. Patient reported previous challenges with accessing food. Patient started receving SNAP benefits in October of 2024 and feels it is sufficient. CSW informed patient of [employer] [employer] at the [facility].  Marguerita Merles, LCSWA Clinical Social Worker [facility]

## 2023-04-09 ENCOUNTER — Inpatient Hospital Stay (HOSPITAL_BASED_OUTPATIENT_CLINIC_OR_DEPARTMENT_OTHER): Payer: Medicaid Other | Admitting: Gynecologic Oncology

## 2023-04-09 ENCOUNTER — Inpatient Hospital Stay: Payer: Medicaid Other | Admitting: Psychiatry

## 2023-04-09 ENCOUNTER — Inpatient Hospital Stay: Payer: Medicaid Other

## 2023-04-09 ENCOUNTER — Encounter: Payer: Self-pay | Admitting: Psychiatry

## 2023-04-09 VITALS — BP 142/80 | HR 55 | Temp 97.7°F | Resp 19 | Wt 141.2 lb

## 2023-04-09 DIAGNOSIS — Z7982 Long term (current) use of aspirin: Secondary | ICD-10-CM | POA: Diagnosis not present

## 2023-04-09 DIAGNOSIS — B1920 Unspecified viral hepatitis C without hepatic coma: Secondary | ICD-10-CM | POA: Diagnosis not present

## 2023-04-09 DIAGNOSIS — K6289 Other specified diseases of anus and rectum: Secondary | ICD-10-CM | POA: Diagnosis not present

## 2023-04-09 DIAGNOSIS — Z8042 Family history of malignant neoplasm of prostate: Secondary | ICD-10-CM | POA: Diagnosis not present

## 2023-04-09 DIAGNOSIS — Z801 Family history of malignant neoplasm of trachea, bronchus and lung: Secondary | ICD-10-CM | POA: Diagnosis not present

## 2023-04-09 DIAGNOSIS — R1900 Intra-abdominal and pelvic swelling, mass and lump, unspecified site: Secondary | ICD-10-CM

## 2023-04-09 DIAGNOSIS — L989 Disorder of the skin and subcutaneous tissue, unspecified: Secondary | ICD-10-CM | POA: Diagnosis not present

## 2023-04-09 DIAGNOSIS — Z79899 Other long term (current) drug therapy: Secondary | ICD-10-CM | POA: Diagnosis not present

## 2023-04-09 DIAGNOSIS — Z808 Family history of malignant neoplasm of other organs or systems: Secondary | ICD-10-CM | POA: Diagnosis not present

## 2023-04-09 DIAGNOSIS — F17210 Nicotine dependence, cigarettes, uncomplicated: Secondary | ICD-10-CM | POA: Diagnosis not present

## 2023-04-09 DIAGNOSIS — R772 Abnormality of alphafetoprotein: Secondary | ICD-10-CM | POA: Diagnosis not present

## 2023-04-09 DIAGNOSIS — Z803 Family history of malignant neoplasm of breast: Secondary | ICD-10-CM | POA: Diagnosis not present

## 2023-04-09 DIAGNOSIS — Z8673 Personal history of transient ischemic attack (TIA), and cerebral infarction without residual deficits: Secondary | ICD-10-CM | POA: Diagnosis not present

## 2023-04-09 DIAGNOSIS — K629 Disease of anus and rectum, unspecified: Secondary | ICD-10-CM

## 2023-04-09 LAB — LACTATE DEHYDROGENASE: LDH: 130 U/L (ref 98–192)

## 2023-04-09 LAB — CEA (ACCESS): CEA (CHCC): 4.37 ng/mL (ref 0.00–5.00)

## 2023-04-09 NOTE — Progress Notes (Signed)
Patient here for a pre-operative appointment prior to her scheduled surgery on 05/08/2023. She is scheduled for a exploratory laparotomy, total abdominal hysterectomy, bilateral salpingo-oophorectomy, possible debulking versus staging if a cancer or precancer is identified.  The surgery was discussed in detail.  See after visit summary for additional details. Visual aids used to discuss items related to surgery including the incentive spirometer and sequential compression stockings.      Discussed post-op pain management in detail including the aspects of the enhanced recovery pathway.  We discussed the use of tylenol and ibuprofen post-op and to monitor for a maximum of 4,000 mg in a 24 hour period.  Also prescribed sennakot to be used after surgery and to hold if having loose stools.  Discussed bowel regimen in detail.     Discussed the use of SCDs and measures to take at home to prevent DVT including frequent mobility.  Reportable signs and symptoms of DVT discussed. Post-operative instructions discussed and expectations for after surgery. Incisional care discussed as well including reportable signs and symptoms including erythema, drainage, wound separation.     30 minutes spent with the patient.  Verbalizing understanding of material discussed. No needs or concerns voiced at the end of the visit.   Advised patient to call for any needs.  Advised that her post-operative medications had been prescribed and could be picked up at any time.    This appointment is included in the global surgical bundle as pre-operative teaching and has no charge.

## 2023-04-09 NOTE — Progress Notes (Signed)
GYNECOLOGIC ONCOLOGY NEW PATIENT CONSULTATION  Date of Service: 04/09/2023 Referring Provider: Nettie Elm, MD   ASSESSMENT AND PLAN: Diana Harris is a 57 y.o. woman with 27cm abdominopelvic mass of adnexal versus uterine etiology.  We reviewed that the exact etiology of the pelvic mass is unclear, but could include a benign, borderline, or malignant process.  It is also unclear if this is from the ovary or the uterus.  She does have uterine fibroids.  The left ovary is not visualized so this mass may be arising from the left ovary.  The recommended treatment is surgical excision to make a definitive diagnosis.  Her CA125 is within normal limits.  Her AFP is mildly elevated.  Unsure at this time if this is related to the adnexal mass or due to other hepatic reasons in the setting of her history of hepatitis C and alcohol use.  Recommend obtaining some additional tumor markers including CEA, CA 19-9, inhibin B, and LDH.  Additionally, on exam today, noted a pink lesion of the perianal tissue.  Biopsy obtained.  We will follow-up these results to see if any additional workup needed in case of joint procedure indicated.  Because the mass is large, laparotomy will be required to remove the mass intact.  We did discuss considering possible partial drainage of the cystic portion if on laparotomy the mass looks reassuring.  This would just be to reduce the size and improve visualization, but would still perform a laparotomy to be able to remove the mass intact.  In the event of malignancy or borderline tumor on frozen section, we will perform indicated staging procedures. We discussed that these procedures may include omentectomy pelvic and/or para-aortic lymphadenectomy, peritoneal biopsies. We would also remove any tissue concerning for metastatic disease which could require additional procedures including bowel surgery.  Patient was consented for: Total abdominal hysterectomy, bilateral  salpingo-oophorectomy, possible debulking versus staging on 05/08/2023.  The risks of surgery were discussed in detail and she understands these to including but not limited to bleeding requiring a blood transfusion, infection, injury to adjacent organs (including but not limited to the bowels, bladder, ureters, nerves, blood vessels), thromboembolic events, wound separation, hernia, vaginal cuff separation, possible risk of lymphedema and lymphocyst if lymphadenectomy performed, unforseen complication, possible need for re-exploration, and medical complications such as heart attack, stroke, pneumonia.  If the patient experiences any of these events, she understands that her hospitalization or recovery may be prolonged and that she may need to take additional medications for a prolonged period. The patient will receive DVT and antibiotic prophylaxis as indicated. She voiced a clear understanding. She had the opportunity to ask questions and informed consent was obtained today. She wishes to proceed.  We will obtain preoperative clearance from her PCP in the setting of history of stroke.  Will ensure that patient is safe to hold her 325 mg aspirin for 1 week prior to surgery. Her METs are >4.  All preoperative instructions were reviewed. Postoperative expectations were also reviewed. Written handouts were provided to the patient.   A copy of this note was sent to the patient's referring provider.  Clide Cliff, MD Gynecologic Oncology   Medical Decision Making I personally spent  TOTAL 60 minutes face-to-face and non-face-to-face in the care of this patient, which includes all pre, intra, and post visit time on the date of service.  ------------  CC: Abdominal mass  HISTORY OF PRESENT ILLNESS:  Diana Harris is a 57 y.o. woman who is  seen in consultation at the request of Nettie Elm, MD for evaluation of abdominal mass.  Patient presented to OB/GYN for evaluation of pelvic mass.   This was previously noted on a CT abdomen/pelvis in November 2023.  At that time the scan noted a large complex mass with predominantly cystic/fluid component and thick peripheral rim of lobulated soft tissue arising from the pelvis and extending superiorly occupied in the left hemiabdomen, measuring 21 x 12 x 26 cm.  No adenopathy was noted at that time.  A repeat scan was done on 01/24/2023 at which time it measured 22.6 x 13 x 27.8 cm (no significant change in size).  She is also noted to have multiple large uterine fibroids and no adenopathy.  She has previously had a CA125 which was normal at 8 on 12/27/2022, and an elevated AFP to 6.4 at that time.  Beta-hCG was negative.  Following her visit with her OB/GYN she underwent an MRI pelvis on 03/23/2023 which showed a partially imaged large cystic lesion with multiple solid areas and multiple uterine leiomyomas.  Patient presents today with her friend.  She reports that she has noted hardness on 1 side of her abdomen for about a year.  Denies any change in her bowel or bladder habits, early satiety, bloating.  Reports weight gain in her stomach only.  Has had some sharp pains on her right side on and off.  When they come they only last a couple seconds.  Happens about once a week.  Patient follows with Rexene Alberts, NP - ID for treatment of Hepatitis C, s/p treatment completed in 2023. Last had negative quant 12/27/22.  She follows with Dr. Gerlene Burdock sun as her primary care doctor.  She had a history of stroke in 2022.  She reports slurring of words and issues walking.  Since, she notes that everything is returned to normal.  She takes 325 mg aspirin daily.    PAST MEDICAL HISTORY: Past Medical History:  Diagnosis Date   Hepatitis C    Ischemic stroke (HCC) 08/22/2017   Right posterior occipital ischemic stroke/notes 08/22/2017   Tobacco abuse     PAST SURGICAL HISTORY: Past Surgical History:  Procedure Laterality Date   CESAREAN SECTION      CESAREAN SECTION  1985    OB/GYN HISTORY: OB History  Gravida Para Term Preterm AB Living  4 4 0 4   4  SAB IAB Ectopic Multiple Live Births          4    # Outcome Date GA Lbr Len/2nd Weight Sex Type Anes PTL Lv  4 Preterm 1982     Vag-Spont None  LIV  3 Preterm      CS-LTranv   LIV  2 Preterm      CS-LTranv   LIV  1 Preterm      Vag-Spont   LIV      Age at menarche: 89 Age at menopause: 66 Hx of HRT: no Hx of STI: no Last pap: 04/04/2022 NILM, HPV HR negative History of abnormal pap smears: no  SCREENING STUDIES:  Last mammogram: none Last colonoscopy: none  MEDICATIONS:  Current Outpatient Medications:    aspirin EC 325 MG tablet, Take 1 tablet (325 mg total) by mouth daily., Disp: 90 tablet, Rfl: 3   atorvastatin (LIPITOR) 80 MG tablet, Take 1 tablet (80 mg total) by mouth daily., Disp: 90 tablet, Rfl: 3   ibuprofen (ADVIL) 800 MG tablet, Take 1 tablet (800 mg total) by  mouth every 8 (eight) hours as needed (pain)., Disp: 21 tablet, Rfl: 0   amoxicillin-clavulanate (AUGMENTIN) 500-125 MG tablet, Take 1 tablet by mouth 2 (two) times daily., Disp: 20 tablet, Rfl: 0  ALLERGIES: No Known Allergies  FAMILY HISTORY: Family History  Problem Relation Age of Onset   Hypertension Mother    Hypertension Father    Breast cancer Sister    Prostate cancer Brother    Lung cancer Brother    Brain cancer Brother    Colon cancer Neg Hx    Ovarian cancer Neg Hx    Endometrial cancer Neg Hx    Pancreatic cancer Neg Hx     SOCIAL HISTORY: Social History   Socioeconomic History   Marital status: Single    Spouse name: Not on file   Number of children: Not on file   Years of education: Not on file   Highest education level: Not on file  Occupational History   Not on file  Tobacco Use   Smoking status: Every Day    Current packs/day: 0.25    Average packs/day: 0.3 packs/day for 40.0 years (10.0 ttl pk-yrs)    Types: Cigarettes   Smokeless tobacco: Never   Tobacco  comments:    Trying to cut back   Vaping Use   Vaping status: Never Used  Substance and Sexual Activity   Alcohol use: Yes    Alcohol/week: 12.0 standard drinks of alcohol    Types: 12 Cans of beer per week   Drug use: Not Currently    Types: "Crack" cocaine    Comment: Hx of cocaine use (smoked)   Sexual activity: Yes    Partners: Male  Other Topics Concern   Not on file  Social History Narrative   Not on file   Social Determinants of Health   Financial Resource Strain: Not on file  Food Insecurity: Food Insecurity Present (04/04/2023)   Hunger Vital Sign    Worried About Running Out of Food in the Last Year: Often true    Ran Out of Food in the Last Year: Often true  Transportation Needs: No Transportation Needs (04/04/2023)   PRAPARE - Administrator, Civil Service (Medical): No    Lack of Transportation (Non-Medical): No  Physical Activity: Not on file  Stress: Not on file  Social Connections: Not on file  Intimate Partner Violence: Not At Risk (04/04/2023)   Humiliation, Afraid, Rape, and Kick questionnaire    Fear of Current or Ex-Partner: No    Emotionally Abused: No    Physically Abused: No    Sexually Abused: No    REVIEW OF SYSTEMS: New patient intake form was reviewed.  Complete 10-system review is negative except for the following: none  PHYSICAL EXAM: BP (!) 142/80 (BP Location: Right Arm)   Pulse (!) 55   Temp 97.7 F (36.5 C) (Oral)   Resp 19   Wt 141 lb 3.2 oz (64 kg)   SpO2 100%   BMI 25.83 kg/m  Constitutional: No acute distress. Neuro/Psych: Alert, oriented.  Head and Neck: Normocephalic, atraumatic. Neck symmetric without masses. Sclera anicteric.  Respiratory: Normal work of breathing. Clear to auscultation bilaterally. Cardiovascular: Regular rate and rhythm, no murmurs, rubs, or gallops. Abdomen: Normoactive bowel sounds. Soft, non-distended, non-tender to palpation. Firm mass palpated in the abdomen feeling the majority of the  abdomen to the upper abdomen particularly on the left.  Extremities: Grossly normal range of motion. Warm, well perfused. No edema bilaterally. Skin:  No rashes or lesions. Lymphatic: No cervical, supraclavicular, or inguinal adenopathy. Genitourinary: External genitalia without lesions. Urethral meatus without lesions or prolapse. On speculum exam, vagina and cervix without lesions, cervix deviated to the left.  Bimanual exam reveals normal cervix.  Uterus able to be discretely palpated separate from large firm mass filling abdomen.  Pink lesion noted on the perianal tissue at about 8-9 o'clock.  Rectovaginal exam without discrete perianal mass or rectal mass.   Exam chaperoned by Warner Mccreedy, NP    PERIANAL BIOPSY  Diana Harris is a 57 y.o. woman who presents today for a biopsy, see details of the visit above.  The procedure was explained to the patient and verbal consent was obtained prior to the procedure.  The skin was cleaned with Betadine.  0.5 ml of 2% lidocaine was injected at the planned biopsy site.  A tischler biopsy forcep was used to obtain the biopsy specimen.  Hemostasis was achieved with silver nitrate.  Patient tolerated the procedure well.   LABORATORY AND RADIOLOGIC DATA: Outside medical records were reviewed to synthesize the above history, along with the history and physical obtained during the visit.  Outside laboratory, pathology, and imaging reports were reviewed, with pertinent results below.  I personally reviewed the outside images.  WBC  Date Value Ref Range Status  03/29/2022 3.7 3.4 - 10.8 x10E3/uL Final  02/10/2022 7.5 4.0 - 10.5 K/uL Final   Hemoglobin  Date Value Ref Range Status  03/29/2022 13.3 11.1 - 15.9 g/dL Final   Hematocrit  Date Value Ref Range Status  03/29/2022 38.8 34.0 - 46.6 % Final   Platelets  Date Value Ref Range Status  03/29/2022 189 150 - 450 x10E3/uL Final   Magnesium  Date Value Ref Range Status  02/10/2022 2.1 1.7 - 2.4  mg/dL Final    Comment:    Performed at [facility], [address], [place], [place] [address]   Creat  Date Value Ref Range Status  06/28/2022 0.88 0.50 - 1.03 mg/dL Final   AST  Date Value Ref Range Status  12/27/2022 15 10 - 35 U/L Final   ALT  Date Value Ref Range Status  12/27/2022 16 6 - 29 U/L Final  04/19/2022 71 (H) 6 - 29 U/L Final   CA 125  Date Value Ref Range Status  12/27/2022 8 <35 U/mL Final    Comment:    . This test was performed using the Siemens  Chemiluminescent method. Values obtained from different assay methods cannot be used  interchangeably. CA 125 levels, regardless of  value, should not be interpreted as absolute evidence of the presence or absence of disease. .    AFP-Tumor Marker  Date Value Ref Range Status  12/27/2022 6.4 (H) ng/mL Final    Comment:    Reference Range:   <6.1 The use of AFP as a tumor marker in pregnant females is not recommended. . . This test was performed using the Beckman Coulter chemiluminescent method. Values obtained from different assay methods cannot be used interchangeably. AFP levels, regardless of value, should not be interpreted as absolute evidence of the presence or absence of disease. .    Diagnosis  Date Value Ref Range Status  04/04/2022   Final   - Negative for intraepithelial lesion or malignancy (NILM)   MR PELVIS W WO CONTRAST 03/23/2023  Narrative CLINICAL DATA:  Fibroids suspected. Pelvic mass. Abdominal distension and pain.  EXAM: MRI PELVIS WITHOUT AND WITH CONTRAST  TECHNIQUE:  Multiplanar multisequence MR imaging of the pelvis was performed both before and after administration of intravenous contrast.  CONTRAST:  6mL GADAVIST GADOBUTROL 1 MMOL/ML IV SOLN  COMPARISON:  CT scan abdomen and pelvis from 01/24/2023.  FINDINGS: Urinary Tract:  No abnormality visualized.  Bowel:  Unremarkable visualized pelvic bowel loops.  Vascular/Lymphatic: No pathologically  enlarged lymph nodes. No significant vascular abnormality seen.  Reproductive: Anteverted uterus with endometrial thickness of 4-5 mm. No focal endometrial lesion. There are several hypointense uterine lesions with largest measuring up to 5.1 x 6.9 cm arising exophytically from the posterior uterus, favored to represent uterine leiomyomas.  Left ovary is visualized and appear grossly unremarkable. Right ovary is not distinctly visualized.  There is a partially imaged large cystic lesion with multiple solid areas predominantly along the right wall of the lesion. This was better evaluated on the prior CT scan from 01/24/2023 and favored to represent ovarian neoplastic lesion.  Other: The lesion exerts external mass effect on the bowel which are pushed to the right. Bilateral kidneys, spleen, pancreas and liver appear grossly unremarkable on the basis of localizer images.  Musculoskeletal: No suspicious bone lesions identified.  IMPRESSION: 1. Partially imaged large cystic lesion with multiple solid areas predominantly along the right wall of the lesion. This was better evaluated on the prior CT scan from 01/24/2023 and favored to represent ovarian neoplastic lesion. 2. Multiple uterine leiomyomas with largest measuring up to 5.1 x 6.9 cm arising exophytically from the posterior uterus.   Electronically Signed By: Jules Schick M.D. On: 03/30/2023 14:24   CT ABDOMEN PELVIS W CONTRAST 01/24/2023  Narrative CLINICAL DATA:  Abdominal distension. Right-sided abdominal pain. Large complex mass occupying the entire left hemiabdomen on an abdomen and pelvis CT dated 05/24/2022 with imaging findings concerning for ovarian malignancy.  EXAM: CT ABDOMEN AND PELVIS WITH CONTRAST  TECHNIQUE: Multidetector CT imaging of the abdomen and pelvis was performed using the standard protocol following bolus administration of intravenous contrast.  RADIATION DOSE REDUCTION: This exam was  performed according to the departmental dose-optimization program which includes automated exposure control, adjustment of the mA and/or kV according to patient size and/or use of iterative reconstruction technique.  CONTRAST:  75mL OMNIPAQUE IOHEXOL 300 MG/ML  SOLN  COMPARISON:  05/24/2022.  FINDINGS: Lower chest: Unremarkable.  Hepatobiliary: Stable small lateral segment left lobe liver cyst. This does not need imaging follow-up. Unremarkable gallbladder.  Pancreas: Unremarkable. No pancreatic ductal dilatation or surrounding inflammatory changes.  Spleen: Normal in size without focal abnormality.  Adrenals/Urinary Tract: Adrenal glands are unremarkable. Kidneys are normal, without renal calculi, focal lesion, or hydronephrosis. Bladder is unremarkable.  Stomach/Bowel: The colon and small bowel are markedly displaced into the lateral right abdomen by cystic and solid mass in the mid and left abdomen. No dilated bowel loops. Normal-appearing appendix. Unremarkable stomach.  Vascular/Lymphatic: Atheromatous arterial calcifications without aneurysm. No enlarged lymph nodes.  Reproductive: Multiple large uterine fibroids are again demonstrated. The largest measures 6.0 cm.  A huge cystic and solid mass is again demonstrated extending from the lower pelvis into the upper abdomen involving the left side of the abdomen more than the right. In the axial plane, this currently measures 22.6 x 13.0 cm on image number 44/2 at the level where it previously measured 21.2 x 12.3 cm. In the sagittal plane, this measures 27.8 cm in length on image number 74/6 at the level where it previously measured 26.9 cm in length.  Other: No free peritoneal fluid or peritoneal nodularity.  Tiny umbilical hernia containing fat.  Musculoskeletal: Minimal lumbar spine degenerative changes.  IMPRESSION: 1. Huge cystic and solid mass extending from the lower pelvis into the upper abdomen involving  the left side of the abdomen more than the right. This has not significantly changed in size since 05/24/2022. This remains highly suspicious for an ovarian neoplasm. 2. Multiple large uterine fibroids. 3. Tiny umbilical hernia containing fat.   Electronically Signed By: Beckie Salts M.D. On: 01/30/2023 22:10

## 2023-04-09 NOTE — Patient Instructions (Signed)
Today Dr. Alvester Morin took a biopsy from close to your anus (opening where the stool comes out). You will be contacted with these results. When you use the bathroom, rinse the area with a peri bottle and pat dry. You may have some light bleeding from this area.   We will need to obtain medical clearance from your medical primary care doctor.  Preparing for your Surgery  Plan for surgery on May 08, 2023 with Dr. Clide Cliff at [facility]. You will be scheduled for exploratory laparotomy (open larger incision), total abdominal hysterectomy (removal of the uterus and cervix), bilateral salpingo-oophorectomy (removal of the ovaries and fallopian tubes), possible debulking versus staging if a cancer or precancer is identified.   Pre-operative Testing -You will receive a phone call from presurgical testing at [facility] to arrange for a pre-operative appointment and lab work.  -Bring your insurance card, copy of an advanced directive if applicable, medication list  -At that visit, you will be asked to sign a consent for a possible blood transfusion in case a transfusion becomes necessary during surgery.  The need for a blood transfusion is rare but having consent is a necessary part of your care.     -We will check with your PCP but we typically recommend holding your aspirin for one week before surgery. We will let you know when to resume.  -Do not take supplements such as fish oil (omega 3), red yeast rice, turmeric before your surgery. You want to avoid medications with aspirin in them including headache powders such as BC or Goody's), Excedrin migraine.  Day Before Surgery at Home -You will be asked to take in a light diet the day before surgery. You will be advised you can have clear liquids up until 3 hours before your surgery.    Eat a light diet the day before surgery.  Examples including soups, broths, toast, yogurt, mashed potatoes.  AVOID GAS PRODUCING FOODS AND  BEVERAGES. Things to avoid include carbonated beverages (fizzy beverages, sodas), raw fruits and raw vegetables (uncooked), or beans.   If your bowels are filled with gas, your surgeon will have difficulty visualizing your pelvic organs which increases your surgical risks.  Your role in recovery Your role is to become active as soon as directed by your doctor, while still giving yourself time to heal.  Rest when you feel tired. You will be asked to do the following in order to speed your recovery:  - Cough and breathe deeply. This helps to clear and expand your lungs and can prevent pneumonia after surgery.  - STAY ACTIVE WHEN YOU GET HOME. Do mild physical activity. Walking or moving your legs help your circulation and body functions return to normal. Do not try to get up or walk alone the first time after surgery.   -If you develop swelling on one leg or the other, pain in the back of your leg, redness/warmth in one of your legs, please call the office or go to the Emergency Room to have a doppler to rule out a blood clot. For shortness of breath, chest pain-seek care in the Emergency Room as soon as possible. - Actively manage your pain. Managing your pain lets you move in comfort. We will ask you to rate your pain on a scale of zero to 10. It is your responsibility to tell your doctor or nurse where and how much you hurt so your pain can be treated.  Special Considerations -If you are diabetic,  you may be placed on insulin after surgery to have closer control over your blood sugars to promote healing and recovery.  This does not mean that you will be discharged on insulin.  If applicable, your oral antidiabetics will be resumed when you are tolerating a solid diet.  -Your final pathology results from surgery should be available around one week after surgery and the results will be relayed to you when available.  -Dr. Antionette Char is the surgeon that assists your GYN Oncologist with  surgery.  If you end up staying the night, the next day after your surgery you will either see Dr. Pricilla Holm, Dr. Alvester Morin, or Dr. Antionette Char.  -FMLA forms can be faxed to [phone] and please allow 5-7 business days for completion.  Pain Management After Surgery -Make sure that you have Tylenol and Ibuprofen IF YOU ARE ABLE TO TAKE THESE MEDICATIONS at home to use on a regular basis after surgery for pain control. We recommend alternating the medications every hour to six hours since they work differently and are processed in the body differently for pain relief.  Bowel Regimen -You will be prescribed Sennakot-S to take nightly to prevent constipation especially if you are taking the narcotic pain medication intermittently.  It is important to prevent constipation and drink adequate amounts of liquids. You can stop taking this medication when you are not taking pain medication and you are back on your normal bowel routine.  Risks of Surgery Risks of surgery are low but include bleeding, infection, damage to surrounding structures, re-operation, blood clots, and very rarely death.   Blood Transfusion Information (For the consent to be signed before surgery)  We will be checking your blood type before surgery so in case of emergencies, we will know what type of blood you would need.                                            WHAT IS A BLOOD TRANSFUSION?  A transfusion is the replacement of blood or some of its parts. Blood is made up of multiple cells which provide different functions. Red blood cells carry oxygen and are used for blood loss replacement. White blood cells fight against infection. Platelets control bleeding. Plasma helps clot blood. Other blood products are available for specialized needs, such as hemophilia or other clotting disorders. BEFORE THE TRANSFUSION  Who gives blood for transfusions?  You may be able to donate blood to be used at a later date on yourself  (autologous donation). Relatives can be asked to donate blood. This is generally not any safer than if you have received blood from a stranger. The same precautions are taken to ensure safety when a relative's blood is donated. Healthy volunteers who are fully evaluated to make sure their blood is safe. This is blood bank blood. Transfusion therapy is the safest it has ever been in the practice of medicine. Before blood is taken from a donor, a complete history is taken to make sure that person has no history of diseases nor engages in risky social behavior (examples are intravenous drug use or sexual activity with multiple partners). The donor's travel history is screened to minimize risk of transmitting infections, such as malaria. The donated blood is tested for signs of infectious diseases, such as HIV and hepatitis. The blood is then tested to be sure it is compatible with  you in order to minimize the chance of a transfusion reaction. If you or a relative donates blood, this is often done in anticipation of surgery and is not appropriate for emergency situations. It takes many days to process the donated blood. RISKS AND COMPLICATIONS Although transfusion therapy is very safe and saves many lives, the main dangers of transfusion include:  Getting an infectious disease. Developing a transfusion reaction. This is an allergic reaction to something in the blood you were given. Every precaution is taken to prevent this. The decision to have a blood transfusion has been considered carefully by your caregiver before blood is given. Blood is not given unless the benefits outweigh the risks.  AFTER SURGERY INSTRUCTIONS  Return to work: 4-6 weeks if applicable  You will have a white honeycomb dressing over your larger incision. This dressing can be removed 5 days after surgery and you do not need to reapply a new dressing. Once you remove the dressing, you will notice that you have the surgical glue  (dermabond) on the incision and this will peel off on its own. You can get this dressing wet in the shower the days after surgery prior to removal on the 5th day.   Activity: 1. Be up and out of the bed during the day.  Take a nap if needed.  You may walk up steps but be careful and use the hand rail.  Stair climbing will tire you more than you think, you may need to stop part way and rest.   2. No lifting or straining for 6 weeks over 10 pounds. No pushing, pulling, straining for 6 weeks.  3. No driving for around 1 week(s).  Do not drive if you are taking narcotic pain medicine and make sure that your reaction time has returned.   4. You can shower as soon as the next day after surgery. Shower daily.  Use your regular soap and water (not directly on the incision) and pat your incision(s) dry afterwards; don't rub.  No tub baths or submerging your body in water until cleared by your surgeon. If you have the soap that was given to you by pre-surgical testing that was used before surgery, you do not need to use it afterwards because this can irritate your incisions.   5. No sexual activity and nothing in the vagina for 10-12 weeks.  6. You may experience a small amount of clear drainage from your incisions, which is normal.  If the drainage persists, increases, or changes color please call the office.  7. Do not use creams, lotions, or ointments such as neosporin on your incisions after surgery until advised by your surgeon because they can cause removal of the dermabond glue on your incisions.    8. You may experience vaginal spotting after surgery or when the stitches at the top of the vagina begin to dissolve.  The spotting is normal but if you experience heavy bleeding, call our office.  9. Take Tylenol or ibuprofen first for pain if you are able to take these medications and only use narcotic pain medication for severe pain not relieved by the Tylenol or Ibuprofen.  Monitor your Tylenol intake  to a max of 4,000 mg in a 24 hour period. You can alternate these medications after surgery.  Diet: 1. Low sodium Heart Healthy Diet is recommended but you are cleared to resume your normal (before surgery) diet after your procedure.  2. It is safe to use a laxative, such as  Miralax or Colace, if you have difficulty moving your bowels. You have been prescribed Sennakot-S to take at bedtime every evening after surgery to keep bowel movements regular and to prevent constipation.    Wound Care: 1. Keep clean and dry.  Shower daily.  Reasons to call the Doctor: Fever - Oral temperature greater than 100.4 degrees Fahrenheit Foul-smelling vaginal discharge Difficulty urinating Nausea and vomiting Increased pain at the site of the incision that is unrelieved with pain medicine. Difficulty breathing with or without chest pain New calf pain especially if only on one side Sudden, continuing increased vaginal bleeding with or without clots.   Contacts: For questions or concerns you should contact:  Dr. Clide Cliff at [phone]  Warner Mccreedy, NP at [phone]  After Hours: call [phone] and have the GYN Oncologist paged/contacted (after 5 pm or on the weekends). You will speak with an after hours RN and let he or she know you have had surgery.  Messages sent via mychart are for non-urgent matters and are not responded to after hours so for urgent needs, please call the after hours number.

## 2023-04-10 ENCOUNTER — Telehealth: Payer: Self-pay | Admitting: *Deleted

## 2023-04-10 LAB — CANCER ANTIGEN 19-9: CA 19-9: 20 U/mL (ref 0–35)

## 2023-04-10 NOTE — Telephone Encounter (Signed)
Per Dr Alvester Morin, fax surgical optimization form to the patient's PCP office

## 2023-04-11 LAB — INHIBIN B: Inhibin B: 73.4 pg/mL

## 2023-04-12 LAB — SURGICAL PATHOLOGY

## 2023-04-16 ENCOUNTER — Telehealth: Payer: Self-pay

## 2023-04-16 ENCOUNTER — Telehealth: Payer: Self-pay | Admitting: *Deleted

## 2023-04-16 NOTE — Telephone Encounter (Signed)
Received call from Dr. Quitaque Blas office in regards to surgical clearance.   Surgical clearance form is in PCP box for review.   Please let me know if an apt is needed.   Surgery is scheduled for 11/5.

## 2023-04-16 NOTE — Telephone Encounter (Signed)
Spoke with Ms. Bjelland and relayed message from Warner Mccreedy, NP that the biopsy that was taken near her anus at the office visit in negative for precancer or cancer, "good news" it's showing inflammation. Pt verbalized understanding and thanked the office for calling.

## 2023-04-16 NOTE — Telephone Encounter (Signed)
-----   Message from Doylene Bode sent at 04/16/2023 10:54 AM EDT ----- Could you please let her know the biopsy taken near her anus at her office visit is negative for precancer or cancer? Good news. Showing inflammation.

## 2023-04-19 ENCOUNTER — Ambulatory Visit: Payer: Medicaid Other | Admitting: Podiatry

## 2023-04-19 DIAGNOSIS — M869 Osteomyelitis, unspecified: Secondary | ICD-10-CM

## 2023-04-19 NOTE — Progress Notes (Signed)
  Subjective:  Patient ID: Diana Harris, female    DOB: 30-Dec-1965,  MRN: [id number]  Chief Complaint  Patient presents with   Wound Check    RIGHT 3RD TOE HAVING SWELLING, NO DRAINAGE, DENIES N/V/F/C/SOB, HAVING SOME PAIN    57 y.o. female presents with concern for significant swelling and pain in the right third toe.  Concern for possible osteomyelitis.  Previously had some cellulitis that was treated with antibiotic.  She denies any wound on the toe no drainage.  Does have a history of stubbing the toe badly in 2023 however no recent trauma to the toe.  Has been wearing postop shoe.  Patient denies a history of diabetes type 2.  Previously on Augmentin  Past Medical History:  Diagnosis Date   Hepatitis C    Ischemic stroke (HCC) 08/22/2017   Right posterior occipital ischemic stroke/notes 08/22/2017   Tobacco abuse     No Known Allergies  ROS: Negative except as per HPI above  Objective:  General: AAO x3, NAD  Dermatological: No open wound present on the right third toe however there is significant edema of the right third toe and some dorsal darkening.  Pain on palpation of the distal aspect of the digit.  Vascular:  Dorsalis Pedis artery and Posterior Tibial artery pedal pulses are 2/4 bilateral.  Capillary fill time < 3 sec to all digits.   Neruologic: Grossly intact via light touch bilateral. Protective threshold intact to all sites bilateral.   Musculoskeletal: No gross boney pedal deformities bilateral. No pain, crepitus, or limitation noted with foot and ankle range of motion bilateral. Muscular strength 5/5 in all groups tested bilateral.  Gait: Unassisted, Nonantalgic.   No images are attached to the encounter.  Date: 03/27/2023 Radiographs: Focal bony erosion along the lateral base of the distal phalanx of the third toe possible osteomyelitis  Assessment:   1. Osteomyelitis of toe (HCC)      Plan:  Patient was evaluated and treated and all questions  answered.  # Right third toe edema and pain concern for possible osteomyelitis -Recommend proceeding with ESR and CRP lab testing to evaluate for possible bone infection provided orders for this -Ordered MRI of the right toes to evaluate for osteomyelitis third toe -Patient will follow-up to discuss results of after MRI and blood testing -If concern for osteomyelitis may have to consider partial or total amputation of the third toe on the right foot -Finish oral antibiotics and then will monitor off any antibiotics -If she has worsening redness, wound open or develops nausea vomiting fever chills report to the emergency department for evaluation         Corinna Gab, DPM [facility] / [facility]

## 2023-04-24 NOTE — Patient Instructions (Signed)
SURGICAL WAITING ROOM VISITATION Patients having surgery or a procedure may have no more than 2 support people in the waiting area - these visitors may rotate.    Children under the age of 50 must have an adult with them who is not the patient.  If the patient needs to stay at the hospital during part of their recovery, the visitor guidelines for inpatient rooms apply. Pre-op nurse will coordinate an appropriate time for 1 support person to accompany patient in pre-op.  This support person may not rotate.    Please refer to the [facility] website for the visitor guidelines for Inpatients (after your surgery is over and you are in a regular room).       Your procedure is scheduled on: 05-08-23   Report to [facility] Main Entrance    Report to admitting at 12:15 PM   Call this number if you have problems the morning of surgery [phone]   Follow a light diet the day before surgery (avoid gas producing foods)   Do not eat food :After Midnight.   After Midnight you may have the following liquids until 11:30 AM DAY OF SURGERY  Water Non-Citrus Juices (without pulp, NO RED-Apple, White grape, White cranberry) Black Coffee (NO MILK/CREAM OR CREAMERS, sugar ok)  Clear Tea (NO MILK/CREAM OR CREAMERS, sugar ok) regular and decaf                             Plain Jell-O (NO RED)                                           Fruit ices (not with fruit pulp, NO RED)                                     Popsicles (NO RED)                                                               Sports drinks like Gatorade (NO RED)                       If you have questions, please contact your surgeon's office.   FOLLOW  ANY ADDITIONAL PRE OP INSTRUCTIONS YOU RECEIVED FROM YOUR SURGEON'S OFFICE!!!     Oral Hygiene is also important to reduce your risk of infection.                                    Remember - BRUSH YOUR TEETH THE MORNING OF SURGERY WITH YOUR REGULAR TOOTHPASTE   Do NOT  smoke after Midnight   Take these medicines the morning of surgery with A SIP OF WATER:   Atorvastatin  Stop all vitamins and herbal supplements 7 days before surgery                              You may not have  any metal on your body including hair pins, jewelry, and body piercing             Do not wear make-up, lotions, powders, perfumes, or deodorant  Do not wear nail polish including gel and S&S, artificial/acrylic nails, or any other type of covering on natural nails including finger and toenails. If you have artificial nails, gel coating, etc. that needs to be removed by a nail salon please have this removed prior to surgery or surgery may need to be canceled/ delayed if the surgeon/ anesthesia feels like they are unable to be safely monitored.   Do not shave  48 hours prior to surgery.           Do not bring valuables to the hospital. [facility] IS NOT RESPONSIBLE   FOR VALUABLES.   Contacts, dentures or bridgework may not be worn into surgery.   Bring small overnight bag day of surgery.   DO NOT BRING YOUR HOME MEDICATIONS TO THE HOSPITAL. PHARMACY WILL DISPENSE MEDICATIONS LISTED ON YOUR MEDICATION LIST TO YOU DURING YOUR ADMISSION IN THE HOSPITAL!    Special Instructions: Bring a copy of your healthcare power of attorney and living will documents the day of surgery if you haven't scanned them before.              Please read over the following fact sheets you were given: IF YOU HAVE QUESTIONS ABOUT YOUR PRE-OP INSTRUCTIONS PLEASE CALL [phone] [place]  If you received a COVID test during your pre-op visit  it is requested that you wear a mask when out in public, stay away from anyone that may not be feeling well and notify your surgeon if you develop symptoms. If you test positive for Covid or have been in contact with anyone that has tested positive in the last 10 days please notify you surgeon.  [place] - Preparing for Surgery Before surgery, you can play an  important role.  Because skin is not sterile, your skin needs to be as free of germs as possible.  You can reduce the number of germs on your skin by washing with CHG (chlorahexidine gluconate) soap before surgery.  CHG is an antiseptic cleaner which kills germs and bonds with the skin to continue killing germs even after washing. Please DO NOT use if you have an allergy to CHG or antibacterial soaps.  If your skin becomes reddened/irritated stop using the CHG and inform your nurse when you arrive at Short Stay. Do not shave (including legs and underarms) for at least 48 hours prior to the first CHG shower.  You may shave your face/neck.  Please follow these instructions carefully:  1.  Shower with CHG Soap the night before surgery and the  morning of surgery.  2.  If you choose to wash your hair, wash your hair first as usual with your normal  shampoo.  3.  After you shampoo, rinse your hair and body thoroughly to remove the shampoo.                             4.  Use CHG as you would any other liquid soap.  You can apply chg directly to the skin and wash.  Gently with a scrungie or clean washcloth.  5.  Apply the CHG Soap to your body ONLY FROM THE NECK DOWN.   Do   not use on face/ open  Wound or open sores. Avoid contact with eyes, ears mouth and   genitals (private parts).                       Wash face,  Genitals (private parts) with your normal soap.             6.  Wash thoroughly, paying special attention to the area where your    surgery  will be performed.  7.  Thoroughly rinse your body with warm water from the neck down.  8.  DO NOT shower/wash with your normal soap after using and rinsing off the CHG Soap.                9.  Pat yourself dry with a clean towel.            10.  Wear clean pajamas.            11.  Place clean sheets on your bed the night of your first shower and do not  sleep with pets. Day of Surgery : Do not apply any lotions/deodorants the  morning of surgery.  Please wear clean clothes to the hospital/surgery center.  FAILURE TO FOLLOW THESE INSTRUCTIONS MAY RESULT IN THE CANCELLATION OF YOUR SURGERY  PATIENT SIGNATURE_________________________________  NURSE SIGNATURE__________________________________  ________________________________________________________________________    WHAT IS A BLOOD TRANSFUSION? Blood Transfusion Information  A transfusion is the replacement of blood or some of its parts. Blood is made up of multiple cells which provide different functions. Red blood cells carry oxygen and are used for blood loss replacement. White blood cells fight against infection. Platelets control bleeding. Plasma helps clot blood. Other blood products are available for specialized needs, such as hemophilia or other clotting disorders. BEFORE THE TRANSFUSION  Who gives blood for transfusions?  Healthy volunteers who are fully evaluated to make sure their blood is safe. This is blood bank blood. Transfusion therapy is the safest it has ever been in the practice of medicine. Before blood is taken from a donor, a complete history is taken to make sure that person has no history of diseases nor engages in risky social behavior (examples are intravenous drug use or sexual activity with multiple partners). The donor's travel history is screened to minimize risk of transmitting infections, such as malaria. The donated blood is tested for signs of infectious diseases, such as HIV and hepatitis. The blood is then tested to be sure it is compatible with you in order to minimize the chance of a transfusion reaction. If you or a relative donates blood, this is often done in anticipation of surgery and is not appropriate for emergency situations. It takes many days to process the donated blood. RISKS AND COMPLICATIONS Although transfusion therapy is very safe and saves many lives, the main dangers of transfusion include:  Getting an  infectious disease. Developing a transfusion reaction. This is an allergic reaction to something in the blood you were given. Every precaution is taken to prevent this. The decision to have a blood transfusion has been considered carefully by your caregiver before blood is given. Blood is not given unless the benefits outweigh the risks. AFTER THE TRANSFUSION Right after receiving a blood transfusion, you will usually feel much better and more energetic. This is especially true if your red blood cells have gotten low (anemic). The transfusion raises the level of the red blood cells which carry oxygen, and this usually causes an energy increase. The nurse administering  the transfusion will monitor you carefully for complications. HOME CARE INSTRUCTIONS  No special instructions are needed after a transfusion. You may find your energy is better. Speak with your caregiver about any limitations on activity for underlying diseases you may have. SEEK MEDICAL CARE IF:  Your condition is not improving after your transfusion. You develop redness or irritation at the intravenous (IV) site. SEEK IMMEDIATE MEDICAL CARE IF:  Any of the following symptoms occur over the next 12 hours: Shaking chills. You have a temperature by mouth above 102 F (38.9 C), not controlled by medicine. Chest, back, or muscle pain. People around you feel you are not acting correctly or are confused. Shortness of breath or difficulty breathing. Dizziness and fainting. You get a rash or develop hives. You have a decrease in urine output. Your urine turns a dark color or changes to pink, red, or brown. Any of the following symptoms occur over the next 10 days: You have a temperature by mouth above 102 F (38.9 C), not controlled by medicine. Shortness of breath. Weakness after normal activity. The white part of the eye turns yellow (jaundice). You have a decrease in the amount of urine or are urinating less often. Your urine  turns a dark color or changes to pink, red, or brown. Document Released: 06/16/2000 Document Revised: 09/11/2011 Document Reviewed: 02/03/2008 [facility] Patient Information 2014 [place], [place].  _______________________________________________________________________

## 2023-04-24 NOTE — Progress Notes (Signed)
COVID Vaccine Completed:  Date of COVID positive in last 90 days:  PCP - Vonna Drafts, MD Cardiologist -   Chest x-ray -  EKG -  Stress Test -  ECHO - 02-04-21 Epic Cardiac Cath -  Pacemaker/ICD device last checked: Spinal Cord Stimulator:  Bowel Prep -   Sleep Study -  CPAP -   Fasting Blood Sugar -  Checks Blood Sugar _____ times a day  Last dose of GLP1 agonist-  N/A GLP1 instructions:  N/A   Last dose of SGLT-2 inhibitors-  N/A SGLT-2 instructions: N/A   Blood Thinner Instructions:  Time Aspirin Instructions: Last Dose:  Activity level:  Can go up a flight of stairs and perform activities of daily living without stopping and without symptoms of chest pain or shortness of breath.  Able to exercise without symptoms  Unable to go up a flight of stairs without symptoms of     Anesthesia review:   Patient denies shortness of breath, fever, cough and chest pain at PAT appointment  Patient verbalized understanding of instructions that were given to them at the PAT appointment. Patient was also instructed that they will need to review over the PAT instructions again at home before surgery.

## 2023-04-25 ENCOUNTER — Telehealth: Payer: Self-pay | Admitting: *Deleted

## 2023-04-25 NOTE — Telephone Encounter (Signed)
Asher Muir with [facility] LVM on nurse line in regards to patients upcoming surgery.   She reports the patient noted taking Aspirin 325mg  per day.   The surgeon is requesting to change to 81mg  per day 7 days before surgery date.   Will forward to PCP.   Asher Muir: [phone]

## 2023-04-25 NOTE — Telephone Encounter (Signed)
Received PCP clearance.

## 2023-04-25 NOTE — Telephone Encounter (Signed)
Called patient's PCP office, Dr. Barb Merino and left a message on nurse line to relay message from Warner Mccreedy, NP to see if PCP would switch her to 81 mg of Aspirin for 7 days prior to her surgery on 11/4 pre-op instead of 325 mg.

## 2023-04-26 ENCOUNTER — Encounter (HOSPITAL_COMMUNITY)
Admission: RE | Admit: 2023-04-26 | Discharge: 2023-04-26 | Disposition: A | Discharge status: Home / Self Care | Payer: Medicaid Other | Source: Ambulatory Visit | Attending: Anesthesiology | Admitting: Anesthesiology

## 2023-04-26 DIAGNOSIS — Z01818 Encounter for other preprocedural examination: Secondary | ICD-10-CM | POA: Insufficient documentation

## 2023-04-26 NOTE — Telephone Encounter (Signed)
Received returned call from [place] at [facility].   Advised of provider message regarding change in Aspirin dosage.   She is also asking when patient should restart aspirin post procedure.   Please advise. I can call Asher Muir back with update. Thanks.   Asher Muir: [phone].  Veronda Prude, RN

## 2023-04-26 NOTE — Telephone Encounter (Signed)
Spoke with Dahlia Client from Dr. Molli Hazard office and she relayed message from him that it's ok for patient to take 81 mg of aspirin 7 days prior to her surgery on 11/5 with Dr. Alvester Morin. Dahlia Client advised that she would reach back out to Dr. Barb Merino and ask when the patient can resume back on her 325 mg Aspirin post operatively and call the office back.

## 2023-04-26 NOTE — Telephone Encounter (Signed)
Spoke with Diana Harris regarding her change in Asprin therapy prior surgery. She is to stop 325mg  Asprin and switch to 81mg  on Tuesday 10/29. She should take 81mg  daily with her last dose being Monday 11/4. We will let her know when to resume.  Pt wrote down these instructions and read the instructions back properly. She has no further questions or concerns at this time

## 2023-04-26 NOTE — Telephone Encounter (Signed)
Returned call to [place]. Advised of provider message.   No further questions at this time.   Veronda Prude, RN

## 2023-04-27 ENCOUNTER — Ambulatory Visit: Payer: Medicaid Other | Admitting: Obstetrics and Gynecology

## 2023-04-30 NOTE — Progress Notes (Addendum)
Diana Harris did not come to her PST appt today. I called her and we did a phone interview. She will come to our office on Friday 05-04-23 at 1300  to have labs, EKG and talk to admitting. She voiced understanding to enter the front of [facility] and go to admitting.   he patient was identified using 2 approved identifiers. All issues noted in this document were discussed and addressed, Diana Harris voiced understanding and agreement with all preoperative instructions.   The patient was instructed to call Admitting Office [phone] or [phone]) to complete their Pre-surgical Interview.     COVID Vaccine received:  []  No [x]  Yes Date of any COVID positive Test in last 90 days:  None  PCP - Dr. Wynelle Link?  Patient had no other info Cardiologist -  ID- Rexene Alberts, NP  Chest x-ray -02-10-2022  1v  Epic  EKG - (02-11-2022 Epic)   will repeat 05-04-2023  Stress Test -  ECHO - 02-04-2021  Epic Cardiac Cath -   PCR screen: []  Ordered & Completed [x]   No Order but Needs PROFEND     []   N/A for this surgery  Surgery Plan:  [x]  Ambulatory   []  Outpatient in bed  []  Admit Anesthesia:    [x]  General  []  Spinal  []   Choice []   MAC  Bowel Prep - []  No  [x]   Yes ______  Pacemaker / ICD device [x]  No []  Yes   Spinal Cord Stimulator:[x]  No []  Yes       History of Sleep Apnea? [x]  No []  Yes   CPAP used?- [x]  No []  Yes    Does the patient monitor blood sugar?   [x]  N/A   []  No []  Yes  Patient has: [x]  NO Hx DM   []  Pre-DM   []  DM1  []   DM2       Blood Thinner / Instructions: none Aspirin Instructions:She is to stop 325mg  Asprin and switch to 81mg  on Tuesday 10/29. She should take 81mg  daily with her last dose being Monday 11/4 Judithe Modest RN note 04-26-2023)   Patient said she was aware of this already.  ERAS Protocol Ordered: []  No  [x]  Yes PRE-SURGERY []  ENSURE  []  G2   [x]  No Drink Ordered Patient is to be NPO after: 1130  Dental hx: []  Dentures:  []  N/A      []  Bridge or Partial:                    [x]  Loose or Damaged teeth:   VERY POOR DENTITION  Comments: Patient will come in on Friday 05-04-2023 at 1300 for labs and EKG  Activity level: Patient is able to climb a flight of stairs without difficulty; [x]  No CP  [x]  No SOB.  Patient can perform ADLs without assistance.   Anesthesia review: Hep C, s/p ischemic CVA 08-22-2017, Polysubstance abuse (?CIWA), smoker  Patient denies shortness of breath, fever, cough and chest pain at PAT appointment.  Patient verbalized understanding and agreement to the Pre-Surgical Instructions that were given to them at this PAT appointment. Patient was also educated of the need to review these PAT instructions again prior to his/her surgery.I reviewed the appropriate phone numbers to call if they have any and questions or concerns.

## 2023-05-01 ENCOUNTER — Encounter: Payer: Self-pay | Admitting: Podiatry

## 2023-05-02 ENCOUNTER — Encounter (HOSPITAL_COMMUNITY): Payer: Self-pay

## 2023-05-02 ENCOUNTER — Other Ambulatory Visit: Payer: Self-pay

## 2023-05-02 ENCOUNTER — Encounter (HOSPITAL_COMMUNITY)
Admission: RE | Admit: 2023-05-02 | Discharge: 2023-05-02 | Disposition: A | Discharge status: Home / Self Care | Payer: Medicaid Other | Source: Ambulatory Visit | Attending: Family Medicine | Admitting: Family Medicine

## 2023-05-02 DIAGNOSIS — Z01818 Encounter for other preprocedural examination: Secondary | ICD-10-CM | POA: Diagnosis not present

## 2023-05-02 HISTORY — DX: Other psychoactive substance abuse, uncomplicated: F19.10

## 2023-05-02 NOTE — Patient Instructions (Signed)
SURGICAL WAITING ROOM VISITATION Patients having surgery or a procedure may have no more than 2 support people in the waiting area - these visitors may rotate in the visitor waiting room.   Due to an increase in RSV and influenza rates and associated hospitalizations, children ages 43 and under may not visit patients in [facility] hospitals. If the patient needs to stay at the hospital during part of their recovery, the visitor guidelines for inpatient rooms apply.  PRE-OP VISITATION  Pre-op nurse will coordinate an appropriate time for 1 support person to accompany the patient in pre-op.  This support person may not rotate.  This visitor will be contacted when the time is appropriate for the visitor to come back in the pre-op area.  Please refer to the [facility] website for the visitor guidelines for Inpatients (after your surgery is over and you are in a regular room).  You are not required to quarantine at this time prior to your surgery. However, you must do this: Hand Hygiene often Do NOT share personal items Notify your provider if you are in close contact with someone who has COVID or you develop fever 100.4 or greater, new onset of sneezing, cough, sore throat, shortness of breath or body aches.  If you test positive for Covid or have been in contact with anyone that has tested positive in the last 10 days please notify you surgeon.    Your procedure is scheduled on:  Tuesday  May 08, 2023  Report to [facility] Main Entrance: [place] entrance where the [employer] is available.   Report to admitting at: 12:15  AM  Call this number if you have any questions or problems the morning of surgery [phone]  Do not eat food after Midnight the night prior to your surgery/procedure.  After Midnight you may have the following liquids until   11:30  AM  DAY OF SURGERY  Clear Liquid Diet Water Black Coffee (sugar ok, NO MILK/CREAM OR CREAMERS)  Tea (sugar ok, NO  MILK/CREAM OR CREAMERS) regular and decaf                             Plain Jell-O  with no fruit (NO RED)                                           Fruit ices (not with fruit pulp, NO RED)                                     Popsicles (NO RED)                                                                  Juice: NO CITRUS JUICES: only apple, WHITE grape, WHITE cranberry Sports drinks like Gatorade or Powerade (NO RED)                 FOLLOW ANY ADDITIONAL PRE OP INSTRUCTIONS YOU RECEIVED FROM YOUR SURGEON'S OFFICE!!!  Eat a light diet the day before surgery.  Examples including soups, broths, toast, yogurt, mashed potatoes.  AVOID GAS PRODUCING FOODS. Things to avoid include carbonated beverages (fizzy beverages, sodas), raw fruits and raw vegetables (uncooked), or beans.     Oral Hygiene is also important to reduce your risk of infection.        Remember - BRUSH YOUR TEETH THE MORNING OF SURGERY WITH YOUR REGULAR TOOTHPASTE  Do NOT smoke after Midnight the night before surgery.  STOP TAKING all Vitamins, Herbs and supplements 1 week before your surgery.   Take ONLY these medicines the morning of surgery with A SIP OF WATER: none                   You may not have any metal on your body including hair pins, jewelry, and body piercing  Do not wear make-up, lotions, powders, perfumes or deodorant  Do not wear nail polish including gel and S&S, artificial / acrylic nails, or any other type of covering on natural nails including finger and toenails. If you have artificial nails, gel coating, etc., that needs to be removed by a nail salon, Please have this removed prior to surgery. Not doing so may mean that your surgery could be cancelled or delayed if the Surgeon or anesthesia staff feels like they are unable to monitor you safely.   Do not shave 48 hours prior to surgery to avoid nicks in your skin which may contribute to postoperative infections.   Contacts, Hearing Aids, dentures  or bridgework may not be worn into surgery. DENTURES WILL BE REMOVED PRIOR TO SURGERY PLEASE DO NOT APPLY "Poly grip" OR ADHESIVES!!!  You may bring a small overnight bag with you on the day of surgery, only pack items that are not valuable. [facility] IS NOT RESPONSIBLE   FOR VALUABLES THAT ARE LOST OR STOLEN.   Do not bring your home medications to the hospital. The Pharmacy will dispense medications listed on your medication list to you during your admission in the Hospital.  Please read over the following fact sheets you were given: IF YOU HAVE QUESTIONS ABOUT YOUR PRE-OP INSTRUCTIONS, PLEASE CALL [phone]   [facility] - Preparing for Surgery Before surgery, you can play an important role.  Because skin is not sterile, your skin needs to be as free of germs as possible.  You can reduce the number of germs on your skin by washing with CHG (chlorahexidine gluconate) soap before surgery.  CHG is an antiseptic cleaner which kills germs and bonds with the skin to continue killing germs even after washing. Please DO NOT use if you have an allergy to CHG or antibacterial soaps.  If your skin becomes reddened/irritated stop using the CHG and inform your nurse when you arrive at Short Stay. Do not shave (including legs and underarms) for at least 48 hours prior to the first CHG shower.  You may shave your face/neck.  Please follow these instructions carefully:  1.  Shower with CHG Soap the night before surgery and the  morning of surgery.  2.  If you choose to wash your hair, wash your hair first as usual with your normal  shampoo.  3.  After you shampoo, rinse your hair and body thoroughly to remove the shampoo.                             4.  Use CHG as you would any other liquid soap.  You can  apply chg directly to the skin and wash.  Gently with a scrungie or clean washcloth.  5.  Apply the CHG Soap to your body ONLY FROM THE NECK DOWN.   Do not use on face/ open                            Wound or open sores. Avoid contact with eyes, ears mouth and genitals (private parts).                       Wash face,  Genitals (private parts) with your normal soap.             6.  Wash thoroughly, paying special attention to the area where your  surgery  will be performed.  7.  Thoroughly rinse your body with warm water from the neck down.  8.  DO NOT shower/wash with your normal soap after using and rinsing off the CHG Soap.            9.  Pat yourself dry with a clean towel.            10.  Wear clean pajamas.            11.  Place clean sheets on your bed the night of your first shower and do not  sleep with pets.  ON THE DAY OF SURGERY : Do not apply any lotions/deodorants the morning of surgery.  Please wear clean clothes to the hospital/surgery center.     FAILURE TO FOLLOW THESE INSTRUCTIONS MAY RESULT IN THE CANCELLATION OF YOUR SURGERY  PATIENT SIGNATURE_________________________________  NURSE SIGNATURE__________________________________  ________________________________________________________________________

## 2023-05-04 ENCOUNTER — Encounter (HOSPITAL_COMMUNITY)
Admission: RE | Admit: 2023-05-04 | Discharge: 2023-05-04 | Disposition: A | Discharge status: Home / Self Care | Payer: Medicaid Other | Source: Ambulatory Visit | Attending: Psychiatry | Admitting: Psychiatry

## 2023-05-04 VITALS — BP 155/97 | HR 68 | Temp 97.6°F | Resp 16 | Ht 62.0 in | Wt 142.0 lb

## 2023-05-04 DIAGNOSIS — I2510 Atherosclerotic heart disease of native coronary artery without angina pectoris: Secondary | ICD-10-CM | POA: Diagnosis not present

## 2023-05-04 DIAGNOSIS — R1900 Intra-abdominal and pelvic swelling, mass and lump, unspecified site: Secondary | ICD-10-CM

## 2023-05-04 DIAGNOSIS — R9431 Abnormal electrocardiogram [ECG] [EKG]: Secondary | ICD-10-CM | POA: Insufficient documentation

## 2023-05-04 DIAGNOSIS — I6319 Cerebral infarction due to embolism of other precerebral artery: Secondary | ICD-10-CM

## 2023-05-04 DIAGNOSIS — Z79891 Long term (current) use of opiate analgesic: Secondary | ICD-10-CM

## 2023-05-04 DIAGNOSIS — Z01818 Encounter for other preprocedural examination: Secondary | ICD-10-CM

## 2023-05-04 LAB — COMPREHENSIVE METABOLIC PANEL
ALT: 21 U/L (ref 0–44)
AST: 20 U/L (ref 15–41)
Albumin: 3.8 g/dL (ref 3.5–5.0)
Alkaline Phosphatase: 61 U/L (ref 38–126)
Anion gap: 8 (ref 5–15)
BUN: 14 mg/dL (ref 6–20)
CO2: 23 mmol/L (ref 22–32)
Calcium: 8.9 mg/dL (ref 8.9–10.3)
Chloride: 108 mmol/L (ref 98–111)
Creatinine, Ser: 0.62 mg/dL (ref 0.44–1.00)
GFR, Estimated: >60 mL/min (ref >60)
Glucose, Bld: 101 mg/dL — ABNORMAL HIGH (ref 70–99)
Potassium: 3.5 mmol/L (ref 3.5–5.1)
Sodium: 139 mmol/L (ref 135–145)
Total Bilirubin: 0.6 mg/dL (ref 0.3–1.2)
Total Protein: 6.9 g/dL (ref 6.5–8.1)

## 2023-05-04 LAB — CBC
HCT: 38.7 % (ref 36.0–46.0)
Hemoglobin: 13.2 g/dL (ref 12.0–15.0)
MCH: 31.7 pg (ref 26.0–34.0)
MCHC: 34.1 g/dL (ref 30.0–36.0)
MCV: 93 fL (ref 80.0–100.0)
Platelets: 194 10*3/uL (ref 150–400)
RBC: 4.16 MIL/uL (ref 3.87–5.11)
RDW: 13.5 % (ref 11.5–15.5)
WBC: 6.6 10*3/uL (ref 4.0–10.5)
nRBC: 0 % (ref 0.0–0.2)

## 2023-05-04 LAB — TYPE AND SCREEN
ABO/RH(D): O POS
Antibody Screen: NEGATIVE

## 2023-05-06 ENCOUNTER — Ambulatory Visit
Admission: RE | Admit: 2023-05-06 | Discharge: 2023-05-06 | Disposition: A | Discharge status: Home / Self Care | Payer: Medicaid Other | Source: Ambulatory Visit | Attending: Podiatry | Admitting: Podiatry

## 2023-05-06 DIAGNOSIS — M869 Osteomyelitis, unspecified: Secondary | ICD-10-CM

## 2023-05-07 ENCOUNTER — Telehealth: Payer: Self-pay

## 2023-05-07 NOTE — Telephone Encounter (Signed)
Telephone call to check on pre-operative status.  Patient compliant with pre-operative instructions.  Reinforced nothing to eat after midnight. Clear liquids until 1115. Patient to arrive at 1215. No questions or concerns voiced.  Instructed to call for any needs. 

## 2023-05-08 ENCOUNTER — Encounter (HOSPITAL_COMMUNITY): Payer: Self-pay | Admitting: Psychiatry

## 2023-05-08 ENCOUNTER — Inpatient Hospital Stay (HOSPITAL_COMMUNITY): Payer: Medicaid Other

## 2023-05-08 ENCOUNTER — Other Ambulatory Visit: Payer: Self-pay

## 2023-05-08 ENCOUNTER — Encounter (HOSPITAL_COMMUNITY)
Admission: RE | Disposition: A | Discharge status: Home / Self Care | Payer: Self-pay | Source: Ambulatory Visit | Attending: Psychiatry

## 2023-05-08 ENCOUNTER — Ambulatory Visit (HOSPITAL_COMMUNITY)
Admission: RE | Admit: 2023-05-08 | Discharge: 2023-05-08 | Disposition: A | Discharge status: Home / Self Care | Payer: Medicaid Other | Source: Ambulatory Visit | Attending: Psychiatry | Admitting: Psychiatry

## 2023-05-08 DIAGNOSIS — R1900 Intra-abdominal and pelvic swelling, mass and lump, unspecified site: Secondary | ICD-10-CM | POA: Diagnosis present

## 2023-05-08 DIAGNOSIS — Z538 Procedure and treatment not carried out for other reasons: Secondary | ICD-10-CM | POA: Diagnosis not present

## 2023-05-08 DIAGNOSIS — F17210 Nicotine dependence, cigarettes, uncomplicated: Secondary | ICD-10-CM | POA: Diagnosis not present

## 2023-05-08 LAB — PROTIME-INR
INR: 1.3 — ABNORMAL HIGH (ref 0.8–1.2)
Prothrombin Time: 16 s — ABNORMAL HIGH (ref 11.4–15.2)

## 2023-05-08 LAB — ABO/RH: ABO/RH(D): O POS

## 2023-05-08 SURGERY — EXPLORATORY LAPAROTOMY
Anesthesia: General

## 2023-05-08 MED ORDER — DEXAMETHASONE SODIUM PHOSPHATE 4 MG/ML IJ SOLN: 4.0000 mg | INTRAMUSCULAR | Status: DC

## 2023-05-08 MED ORDER — ONDANSETRON HCL 4 MG/2ML IJ SOLN
INTRAMUSCULAR | Status: AC
  Filled 2023-05-08: qty 2

## 2023-05-08 MED ORDER — DEXAMETHASONE SODIUM PHOSPHATE 10 MG/ML IJ SOLN
INTRAMUSCULAR | Status: AC
  Filled 2023-05-08: qty 1

## 2023-05-08 MED ORDER — HEPARIN SODIUM (PORCINE) 5000 UNIT/ML IJ SOLN
5000.0000 [IU] | INTRAMUSCULAR | Status: AC
  Administered 2023-05-08: 5000 [IU] via SUBCUTANEOUS
  Filled 2023-05-08: qty 1

## 2023-05-08 MED ORDER — LIDOCAINE HCL (PF) 2 % IJ SOLN
INTRAMUSCULAR | Status: AC
  Filled 2023-05-08: qty 5

## 2023-05-08 MED ORDER — ACETAMINOPHEN 500 MG PO TABS
1000.0000 mg | ORAL_TABLET | ORAL | Status: AC
  Administered 2023-05-08: 1000 mg via ORAL
  Filled 2023-05-08: qty 2

## 2023-05-08 MED ORDER — PROPOFOL 10 MG/ML IV BOLUS
INTRAVENOUS | Status: AC
  Filled 2023-05-08: qty 20

## 2023-05-08 MED ORDER — CHLORHEXIDINE GLUCONATE 0.12 % MT SOLN
15.0000 mL | Freq: Once | OROMUCOSAL | Status: AC
  Administered 2023-05-08: 15 mL via OROMUCOSAL

## 2023-05-08 MED ORDER — CEFAZOLIN SODIUM-DEXTROSE 2-4 GM/100ML-% IV SOLN
2.0000 g | INTRAVENOUS | Status: DC
  Filled 2023-05-08: qty 100

## 2023-05-08 MED ORDER — PROPOFOL 1000 MG/100ML IV EMUL
INTRAVENOUS | Status: AC
  Filled 2023-05-08: qty 100

## 2023-05-08 MED ORDER — FENTANYL CITRATE (PF) 100 MCG/2ML IJ SOLN
INTRAMUSCULAR | Status: AC
  Filled 2023-05-08: qty 2

## 2023-05-08 MED ORDER — METRONIDAZOLE 500 MG/100ML IV SOLN
500.0000 mg | INTRAVENOUS | Status: DC
  Filled 2023-05-08: qty 100

## 2023-05-08 MED ORDER — ORAL CARE MOUTH RINSE: 15.0000 mL | Freq: Once | OROMUCOSAL | Status: AC

## 2023-05-08 MED ORDER — ROCURONIUM BROMIDE 10 MG/ML (PF) SYRINGE
PREFILLED_SYRINGE | INTRAVENOUS | Status: AC
  Filled 2023-05-08: qty 10

## 2023-05-08 MED ORDER — SCOPOLAMINE 1 MG/3DAYS TD PT72
1.0000 | MEDICATED_PATCH | TRANSDERMAL | Status: DC
  Administered 2023-05-08: 1.5 mg via TRANSDERMAL
  Filled 2023-05-08: qty 1

## 2023-05-08 MED ORDER — POVIDONE-IODINE 10 % EX SWAB
2.0000 | Freq: Once | CUTANEOUS | Status: AC
  Administered 2023-05-08: 2 via TOPICAL

## 2023-05-08 MED ORDER — MIDAZOLAM HCL 2 MG/2ML IJ SOLN
INTRAMUSCULAR | Status: AC
  Filled 2023-05-08: qty 2

## 2023-05-08 MED ORDER — DEXMEDETOMIDINE HCL IN NACL 80 MCG/20ML IV SOLN
INTRAVENOUS | Status: AC
  Filled 2023-05-08: qty 20

## 2023-05-08 NOTE — Progress Notes (Signed)
NP came by pre op and informed patient that surgery would need to be rescheduled.   Pt. Upset but understanding.   IV discontinued.   1536 Called Dr. Alvester Morin and requested a discharge order to be placed  Surgery Cancelled at 1530

## 2023-05-08 NOTE — Discharge Instructions (Addendum)
Our office will reach out with a new surgery date. You are fine to resume your normal activities.  You can take the aspirin 325 mg daily but you will need to change back down to aspirin 81 mg one week before surgery and not take it the day of surgery.  Contacts: For questions or concerns you should contact:   Dr. Clide Cliff at [phone]   Warner Mccreedy, NP at [phone]   After Hours: call [phone] and have the GYN Oncologist paged/contacted (after 5 pm or on the weekends). You will speak with an after hours RN and let he or she know you have had surgery.   Messages sent via mychart are for non-urgent matters and are not responded to after hours so for urgent needs, please call the after hours number.

## 2023-05-08 NOTE — Progress Notes (Signed)
GYN Oncology Progress Note  Informed by Dr. Alvester Morin of need to postpone patient's surgery to another date. Due to the extensive nature and unforseen time required for Dr. Quinby Blas existing case, there would not be enough allocated time to allow for Diana Harris's case today. Patient is understanding of this. She will be discharged home and contacted by our office with a new surgery date.

## 2023-05-08 NOTE — Anesthesia Preprocedure Evaluation (Addendum)
Anesthesia Evaluation    Reviewed: Allergy & Precautions, Patient's Chart, lab work & pertinent test results  Airway        Dental   Pulmonary Current Smoker and Patient abstained from smoking.          Cardiovascular negative cardio ROS      Neuro/Psych CVA  negative psych ROS   GI/Hepatic negative GI ROS,,,(+) Hepatitis -, C  Endo/Other  negative endocrine ROS    Renal/GU negative Renal ROS     Musculoskeletal   Abdominal   Peds  Hematology   Anesthesia Other Findings   Reproductive/Obstetrics                             Anesthesia Physical Anesthesia Plan  ASA: 3  Anesthesia Plan: General   Post-op Pain Management: Tylenol PO (pre-op)* and Toradol IV (intra-op)*   Induction: Intravenous  PONV Risk Score and Plan: 3 and Dexamethasone, Ondansetron and Midazolam  Airway Management Planned: Oral ETT  Additional Equipment: None  Intra-op Plan:   Post-operative Plan: Extubation in OR  Informed Consent:   Plan Discussed with: CRNA  Anesthesia Plan Comments: (- 2 IV's  - Pt cancelled and will reschedule)       Anesthesia Quick Evaluation

## 2023-05-10 ENCOUNTER — Telehealth: Payer: Self-pay

## 2023-05-10 NOTE — Telephone Encounter (Signed)
Per Warner Mccreedy NP,I reached out to Ms.Gibler this morning regarding a new surgery date of 05/15/23. Pt aware of starting the 81mg  Aspirin today with last dose being on 11/11. She voiced an understanding reporting she has already taken the Aspirin 325 this morning. Advised her starting tomorrow start back on the Aspirin 81mg . She voiced an understanding. Aware our office will call her on Monday for pre-op information.

## 2023-05-10 NOTE — Telephone Encounter (Signed)
-----   Message from Doylene Bode sent at 05/09/2023  4:20 PM EST ----- Please let patient know her new surgery date is Nov 12. She should only be taking the aspirin 81 mg starting now and last dose being the day before surgery (Nov 11).

## 2023-05-10 NOTE — Progress Notes (Signed)
Patient phoned to give updated information on surgery.  Date of Surgery - 05-15-23  Arrival Time -8:45 and  check in at admitting.    NPO Status - patient reminded to not eat solid food after midnight.  From midnight until 8:00 AM may have clear liquids.  Medications morning of surgery - Atorvastatin.  Patient states that Diana Harris will start the Aspirin 81 mg tomorrow and took last dose of Aspirin 325 05-10-23.  No change in medical history, allergies per patient.  Transportation home - Patient to be admitted after surgery.  All questions answered and patient stated understanding

## 2023-05-14 ENCOUNTER — Telehealth: Payer: Self-pay | Admitting: *Deleted

## 2023-05-14 NOTE — Telephone Encounter (Signed)
Telephone call to check on pre-operative status.  Patient compliant with pre-operative instructions.  Reinforced nothing to eat after midnight. Clear liquids until 0745. Patient to arrive at 0845.  No questions or concerns voiced.  Instructed to call for any needs.  °

## 2023-05-14 NOTE — Telephone Encounter (Signed)
Attempted to reach patient for pre-op call. Left voicemail requesting call back to [phone].

## 2023-05-15 ENCOUNTER — Encounter (HOSPITAL_COMMUNITY)
Admission: RE | Disposition: A | Discharge status: Home / Self Care | Payer: Self-pay | Source: Home / Self Care | Attending: Psychiatry

## 2023-05-15 ENCOUNTER — Inpatient Hospital Stay (HOSPITAL_COMMUNITY)
Admission: RE | Admit: 2023-05-15 | Discharge: 2023-05-20 | DRG: 742 | Disposition: A | Discharge status: Home / Self Care | Payer: Medicaid Other | Attending: Psychiatry | Admitting: Psychiatry

## 2023-05-15 ENCOUNTER — Inpatient Hospital Stay (HOSPITAL_COMMUNITY): Payer: Medicaid Other | Admitting: Anesthesiology

## 2023-05-15 ENCOUNTER — Encounter (HOSPITAL_COMMUNITY): Payer: Self-pay | Admitting: Psychiatry

## 2023-05-15 ENCOUNTER — Other Ambulatory Visit: Payer: Self-pay

## 2023-05-15 DIAGNOSIS — D251 Intramural leiomyoma of uterus: Secondary | ICD-10-CM | POA: Diagnosis present

## 2023-05-15 DIAGNOSIS — N83202 Unspecified ovarian cyst, left side: Secondary | ICD-10-CM | POA: Diagnosis present

## 2023-05-15 DIAGNOSIS — K660 Peritoneal adhesions (postprocedural) (postinfection): Secondary | ICD-10-CM | POA: Diagnosis present

## 2023-05-15 DIAGNOSIS — F17200 Nicotine dependence, unspecified, uncomplicated: Secondary | ICD-10-CM | POA: Diagnosis present

## 2023-05-15 DIAGNOSIS — N736 Female pelvic peritoneal adhesions (postinfective): Secondary | ICD-10-CM | POA: Diagnosis present

## 2023-05-15 DIAGNOSIS — R1900 Intra-abdominal and pelvic swelling, mass and lump, unspecified site: Principal | ICD-10-CM | POA: Diagnosis present

## 2023-05-15 DIAGNOSIS — K682 Retroperitoneal fibrosis: Secondary | ICD-10-CM | POA: Diagnosis present

## 2023-05-15 DIAGNOSIS — R141 Gas pain: Secondary | ICD-10-CM

## 2023-05-15 DIAGNOSIS — R1907 Generalized intra-abdominal and pelvic swelling, mass and lump: Secondary | ICD-10-CM | POA: Diagnosis not present

## 2023-05-15 DIAGNOSIS — B370 Candidal stomatitis: Secondary | ICD-10-CM | POA: Diagnosis not present

## 2023-05-15 DIAGNOSIS — D252 Subserosal leiomyoma of uterus: Secondary | ICD-10-CM | POA: Diagnosis present

## 2023-05-15 HISTORY — PX: HYSTERECTOMY ABDOMINAL WITH SALPINGO-OOPHORECTOMY: SHX6792

## 2023-05-15 LAB — TYPE AND SCREEN
ABO/RH(D): O POS
Antibody Screen: NEGATIVE

## 2023-05-15 SURGERY — HYSTERECTOMY, ABDOMINAL, WITH SALPINGO-OOPHORECTOMY
Anesthesia: General | Laterality: Bilateral

## 2023-05-15 MED ORDER — POVIDONE-IODINE 10 % EX SWAB
2.0000 | Freq: Once | CUTANEOUS | Status: AC
  Administered 2023-05-15: 2 via TOPICAL

## 2023-05-15 MED ORDER — OXYCODONE HCL 5 MG PO TABS
ORAL_TABLET | ORAL | Status: AC
  Filled 2023-05-15: qty 1

## 2023-05-15 MED ORDER — MIDAZOLAM HCL 5 MG/5ML IJ SOLN
INTRAMUSCULAR | Status: DC | PRN
  Administered 2023-05-15: 2 mg via INTRAVENOUS

## 2023-05-15 MED ORDER — SCOPOLAMINE 1 MG/3DAYS TD PT72
1.0000 | MEDICATED_PATCH | TRANSDERMAL | Status: DC
  Administered 2023-05-15: 1.5 mg via TRANSDERMAL
  Filled 2023-05-15: qty 1

## 2023-05-15 MED ORDER — SODIUM CHLORIDE 0.9% FLUSH: 3.0000 mL | Freq: Two times a day (BID) | INTRAVENOUS | Status: DC

## 2023-05-15 MED ORDER — CEFAZOLIN SODIUM-DEXTROSE 2-4 GM/100ML-% IV SOLN
2.0000 g | INTRAVENOUS | Status: AC
  Administered 2023-05-15: 2 g via INTRAVENOUS
  Filled 2023-05-15: qty 100

## 2023-05-15 MED ORDER — HYDROMORPHONE HCL 1 MG/ML IJ SOLN
0.5000 mg | INTRAMUSCULAR | Status: DC | PRN
  Administered 2023-05-16: 0.5 mg via INTRAVENOUS
  Filled 2023-05-15: qty 0.5

## 2023-05-15 MED ORDER — LIDOCAINE 2% (20 MG/ML) 5 ML SYRINGE
INTRAMUSCULAR | Status: DC | PRN
  Administered 2023-05-15: 50 mg via INTRAVENOUS

## 2023-05-15 MED ORDER — BUPIVACAINE HCL 0.25 % IJ SOLN
INTRAMUSCULAR | Status: DC | PRN
  Administered 2023-05-15: 50 mL

## 2023-05-15 MED ORDER — PROPOFOL 10 MG/ML IV BOLUS
INTRAVENOUS | Status: DC | PRN
  Administered 2023-05-15: 200 mg via INTRAVENOUS

## 2023-05-15 MED ORDER — FENTANYL CITRATE (PF) 100 MCG/2ML IJ SOLN
INTRAMUSCULAR | Status: DC | PRN
  Administered 2023-05-15: 100 ug via INTRAVENOUS
  Administered 2023-05-15 (×3): 50 ug via INTRAVENOUS

## 2023-05-15 MED ORDER — LORAZEPAM 1 MG PO TABS: 1.0000 mg | ORAL_TABLET | ORAL | Status: AC | PRN

## 2023-05-15 MED ORDER — THIAMINE MONONITRATE 100 MG PO TABS
100.0000 mg | ORAL_TABLET | Freq: Every day | ORAL | Status: DC
Start: 2023-05-15 — End: 2023-05-20
  Administered 2023-05-15 – 2023-05-20 (×6): 100 mg via ORAL
  Filled 2023-05-15 (×6): qty 1

## 2023-05-15 MED ORDER — OXYCODONE HCL 5 MG PO TABS
5.0000 mg | ORAL_TABLET | Freq: Once | ORAL | Status: AC | PRN
  Administered 2023-05-15: 5 mg via ORAL

## 2023-05-15 MED ORDER — KCL IN DEXTROSE-NACL 20-5-0.45 MEQ/L-%-% IV SOLN
INTRAVENOUS | Status: DC
Start: 2023-05-15 — End: 2023-05-16

## 2023-05-15 MED ORDER — OXYCODONE HCL 5 MG/5ML PO SOLN: 5.0000 mg | Freq: Once | ORAL | Status: AC | PRN

## 2023-05-15 MED ORDER — FENTANYL CITRATE PF 50 MCG/ML IJ SOSY: 25.0000 ug | PREFILLED_SYRINGE | INTRAMUSCULAR | Status: DC | PRN

## 2023-05-15 MED ORDER — LABETALOL HCL 5 MG/ML IV SOLN
INTRAVENOUS | Status: DC | PRN
  Administered 2023-05-15 (×4): 5 mg via INTRAVENOUS

## 2023-05-15 MED ORDER — CHEWING GUM (ORBIT) SUGAR FREE
1.0000 | CHEWING_GUM | Freq: Three times a day (TID) | ORAL | Status: AC
  Administered 2023-05-16 – 2023-05-18 (×9): 1 via ORAL
  Filled 2023-05-15: qty 1

## 2023-05-15 MED ORDER — BUPIVACAINE LIPOSOME 1.3 % IJ SUSP
INTRAMUSCULAR | Status: AC
  Filled 2023-05-15: qty 20

## 2023-05-15 MED ORDER — ENOXAPARIN SODIUM 40 MG/0.4ML IJ SOSY
40.0000 mg | PREFILLED_SYRINGE | INTRAMUSCULAR | Status: DC
Start: 2023-05-16 — End: 2023-05-20
  Administered 2023-05-16 – 2023-05-20 (×5): 40 mg via SUBCUTANEOUS
  Filled 2023-05-15 (×4): qty 0.4

## 2023-05-15 MED ORDER — ONDANSETRON HCL 4 MG/2ML IJ SOLN
INTRAMUSCULAR | Status: AC
Start: 2023-05-15 — End: ?
  Filled 2023-05-15: qty 2

## 2023-05-15 MED ORDER — 0.9 % SODIUM CHLORIDE (POUR BTL) OPTIME
TOPICAL | Status: DC | PRN
Start: 2023-05-15 — End: 2023-05-15
  Administered 2023-05-15: 2000 mL

## 2023-05-15 MED ORDER — OXYCODONE HCL 5 MG PO TABS
5.0000 mg | ORAL_TABLET | ORAL | Status: DC | PRN
  Administered 2023-05-16 – 2023-05-19 (×7): 5 mg via ORAL
  Filled 2023-05-15 (×7): qty 1

## 2023-05-15 MED ORDER — PHENYLEPHRINE 80 MCG/ML (10ML) SYRINGE FOR IV PUSH (FOR BLOOD PRESSURE SUPPORT)
PREFILLED_SYRINGE | INTRAVENOUS | Status: AC
Start: 2023-05-15 — End: ?
  Filled 2023-05-15: qty 10

## 2023-05-15 MED ORDER — SUGAMMADEX SODIUM 200 MG/2ML IV SOLN
INTRAVENOUS | Status: DC | PRN
  Administered 2023-05-15: 200 mg via INTRAVENOUS

## 2023-05-15 MED ORDER — FOLIC ACID 1 MG PO TABS
1.0000 mg | ORAL_TABLET | Freq: Every day | ORAL | Status: DC
  Administered 2023-05-15 – 2023-05-20 (×6): 1 mg via ORAL
  Filled 2023-05-15 (×6): qty 1

## 2023-05-15 MED ORDER — ATORVASTATIN CALCIUM 20 MG PO TABS
80.0000 mg | ORAL_TABLET | Freq: Every day | ORAL | Status: DC
  Administered 2023-05-16 – 2023-05-20 (×5): 80 mg via ORAL
  Filled 2023-05-15 (×5): qty 4

## 2023-05-15 MED ORDER — PREGABALIN 75 MG PO CAPS
75.0000 mg | ORAL_CAPSULE | Freq: Two times a day (BID) | ORAL | Status: DC
  Administered 2023-05-16 – 2023-05-20 (×9): 75 mg via ORAL
  Filled 2023-05-15 (×9): qty 1

## 2023-05-15 MED ORDER — PROPOFOL 10 MG/ML IV BOLUS
INTRAVENOUS | Status: AC
  Filled 2023-05-15: qty 20

## 2023-05-15 MED ORDER — IBUPROFEN 400 MG PO TABS
600.0000 mg | ORAL_TABLET | Freq: Four times a day (QID) | ORAL | Status: DC
Start: 2023-05-16 — End: 2023-05-20
  Administered 2023-05-16 – 2023-05-20 (×16): 600 mg via ORAL
  Filled 2023-05-15 (×16): qty 1

## 2023-05-15 MED ORDER — CHLORHEXIDINE GLUCONATE 0.12 % MT SOLN
15.0000 mL | Freq: Once | OROMUCOSAL | Status: AC
  Administered 2023-05-15: 15 mL via OROMUCOSAL

## 2023-05-15 MED ORDER — LACTATED RINGERS IV SOLN: INTRAVENOUS | Status: DC | PRN

## 2023-05-15 MED ORDER — ADULT MULTIVITAMIN W/MINERALS CH
1.0000 | ORAL_TABLET | Freq: Every day | ORAL | Status: DC
Start: 2023-05-15 — End: 2023-05-20
  Administered 2023-05-15 – 2023-05-20 (×6): 1 via ORAL
  Filled 2023-05-15 (×6): qty 1

## 2023-05-15 MED ORDER — BUPIVACAINE LIPOSOME 1.3 % IJ SUSP
INTRAMUSCULAR | Status: DC | PRN
  Administered 2023-05-15: 20 mL

## 2023-05-15 MED ORDER — LABETALOL HCL 5 MG/ML IV SOLN
INTRAVENOUS | Status: AC
  Filled 2023-05-15: qty 4

## 2023-05-15 MED ORDER — ONDANSETRON HCL 4 MG PO TABS: 4.0000 mg | ORAL_TABLET | Freq: Four times a day (QID) | ORAL | Status: DC | PRN

## 2023-05-15 MED ORDER — SENNOSIDES-DOCUSATE SODIUM 8.6-50 MG PO TABS
2.0000 | ORAL_TABLET | Freq: Every day | ORAL | Status: DC
  Administered 2023-05-15 – 2023-05-19 (×5): 2 via ORAL
  Filled 2023-05-15 (×5): qty 2

## 2023-05-15 MED ORDER — ORAL CARE MOUTH RINSE: 15.0000 mL | Freq: Once | OROMUCOSAL | Status: AC

## 2023-05-15 MED ORDER — SODIUM CHLORIDE (PF) 0.9 % IJ SOLN
INTRAMUSCULAR | Status: AC
  Filled 2023-05-15: qty 50

## 2023-05-15 MED ORDER — ACETAMINOPHEN 500 MG PO TABS
1000.0000 mg | ORAL_TABLET | ORAL | Status: AC
  Administered 2023-05-15: 1000 mg via ORAL
  Filled 2023-05-15: qty 2

## 2023-05-15 MED ORDER — KETAMINE HCL 10 MG/ML IJ SOLN
INTRAMUSCULAR | Status: DC | PRN
  Administered 2023-05-15 (×2): 25 mg via INTRAVENOUS

## 2023-05-15 MED ORDER — ROCURONIUM BROMIDE 10 MG/ML (PF) SYRINGE
PREFILLED_SYRINGE | INTRAVENOUS | Status: AC
  Filled 2023-05-15: qty 10

## 2023-05-15 MED ORDER — SODIUM CHLORIDE FLUSH 0.9 % IV SOLN
INTRAVENOUS | Status: DC | PRN
  Administered 2023-05-15: 50 mL

## 2023-05-15 MED ORDER — FENTANYL CITRATE (PF) 250 MCG/5ML IJ SOLN
INTRAMUSCULAR | Status: AC
  Filled 2023-05-15: qty 5

## 2023-05-15 MED ORDER — BUPIVACAINE HCL 0.25 % IJ SOLN
INTRAMUSCULAR | Status: AC
  Filled 2023-05-15: qty 1

## 2023-05-15 MED ORDER — ENSURE ENLIVE PO LIQD
237.0000 mL | Freq: Two times a day (BID) | ORAL | Status: DC
  Administered 2023-05-16 – 2023-05-20 (×8): 237 mL via ORAL

## 2023-05-15 MED ORDER — ONDANSETRON HCL 4 MG/2ML IJ SOLN: 4.0000 mg | Freq: Four times a day (QID) | INTRAMUSCULAR | Status: DC | PRN

## 2023-05-15 MED ORDER — METRONIDAZOLE 500 MG/100ML IV SOLN
500.0000 mg | INTRAVENOUS | Status: AC
  Administered 2023-05-15: 500 mg via INTRAVENOUS
  Filled 2023-05-15: qty 100

## 2023-05-15 MED ORDER — MIDAZOLAM HCL 2 MG/2ML IJ SOLN
INTRAMUSCULAR | Status: AC
  Filled 2023-05-15: qty 2

## 2023-05-15 MED ORDER — LIDOCAINE HCL (PF) 2 % IJ SOLN
INTRAMUSCULAR | Status: AC
  Filled 2023-05-15: qty 5

## 2023-05-15 MED ORDER — NON FORMULARY
1.0000 [IU] | Freq: Three times a day (TID) | Status: DC
Start: 2023-05-16 — End: 2023-05-15

## 2023-05-15 MED ORDER — HYDROMORPHONE HCL 2 MG/ML IJ SOLN
INTRAMUSCULAR | Status: AC
  Filled 2023-05-15: qty 1

## 2023-05-15 MED ORDER — ONDANSETRON HCL 4 MG/2ML IJ SOLN
INTRAMUSCULAR | Status: DC | PRN
  Administered 2023-05-15: 4 mg via INTRAVENOUS

## 2023-05-15 MED ORDER — DEXAMETHASONE SODIUM PHOSPHATE 4 MG/ML IJ SOLN
4.0000 mg | INTRAMUSCULAR | Status: AC
  Administered 2023-05-15: 10 mg via INTRAVENOUS

## 2023-05-15 MED ORDER — TRAMADOL HCL 50 MG PO TABS
100.0000 mg | ORAL_TABLET | Freq: Four times a day (QID) | ORAL | Status: DC | PRN
  Administered 2023-05-15: 100 mg via ORAL
  Filled 2023-05-15: qty 2

## 2023-05-15 MED ORDER — HEPARIN SODIUM (PORCINE) 5000 UNIT/ML IJ SOLN
5000.0000 [IU] | INTRAMUSCULAR | Status: AC
  Administered 2023-05-15: 5000 [IU] via SUBCUTANEOUS
  Filled 2023-05-15: qty 1

## 2023-05-15 MED ORDER — KETAMINE HCL 50 MG/5ML IJ SOSY
PREFILLED_SYRINGE | INTRAMUSCULAR | Status: AC
  Filled 2023-05-15: qty 5

## 2023-05-15 MED ORDER — THIAMINE HCL 100 MG/ML IJ SOLN
100.0000 mg | Freq: Every day | INTRAMUSCULAR | Status: DC
Start: 2023-05-15 — End: 2023-05-20
  Filled 2023-05-15: qty 2

## 2023-05-15 MED ORDER — HYDROMORPHONE HCL 1 MG/ML IJ SOLN
INTRAMUSCULAR | Status: DC | PRN
  Administered 2023-05-15: .4 mg via INTRAVENOUS
  Administered 2023-05-15: .6 mg via INTRAVENOUS

## 2023-05-15 MED ORDER — DEXAMETHASONE SODIUM PHOSPHATE 10 MG/ML IJ SOLN
INTRAMUSCULAR | Status: AC
  Filled 2023-05-15: qty 1

## 2023-05-15 MED ORDER — ROCURONIUM BROMIDE 100 MG/10ML IV SOLN
INTRAVENOUS | Status: DC | PRN
  Administered 2023-05-15: 10 mg via INTRAVENOUS
  Administered 2023-05-15: 50 mg via INTRAVENOUS
  Administered 2023-05-15 (×2): 20 mg via INTRAVENOUS

## 2023-05-15 SURGICAL SUPPLY — 75 items
ADH SKN CLS APL DERMABOND .7 (GAUZE/BANDAGES/DRESSINGS) ×2
AGENT HMST KT MTR STRL THRMB (HEMOSTASIS)
APL PRP STRL LF DISP 70% ISPRP (MISCELLANEOUS) ×2
ATTRACTOMAT 16X20 MAGNETIC DRP (DRAPES) ×1 IMPLANT
BAG COUNTER SPONGE SURGICOUNT (BAG) IMPLANT
BAG SPNG CNTER NS LX DISP (BAG)
BINDER ABDOMINAL 12 ML 46-62 (SOFTGOODS) ×1 IMPLANT
BLADE EXTENDED COATED 6.5IN (ELECTRODE) ×2 IMPLANT
CHLORAPREP W/TINT 26 (MISCELLANEOUS) ×2 IMPLANT
CLIP TI LARGE 6 (CLIP) ×2 IMPLANT
CLIP TI MEDIUM 6 (CLIP) ×2 IMPLANT
CLIP TI MEDIUM LARGE 6 (CLIP) ×2 IMPLANT
CNTNR URN SCR LID CUP LEK RST (MISCELLANEOUS) ×1 IMPLANT
CONT SPEC 4OZ STRL OR WHT (MISCELLANEOUS) ×2
COVER SURGICAL LIGHT HANDLE (MISCELLANEOUS) ×2 IMPLANT
DERMABOND ADVANCED .7 DNX12 (GAUZE/BANDAGES/DRESSINGS) ×1 IMPLANT
DRAPE INCISE IOBAN 66X45 STRL (DRAPES) IMPLANT
DRAPE SHEET LG 3/4 BI-LAMINATE (DRAPES) ×2 IMPLANT
DRAPE SURG IRRIG POUCH 19X23 (DRAPES) ×2 IMPLANT
DRAPE WARM FLUID 44X44 (DRAPES) ×2 IMPLANT
DRSG OPSITE POSTOP 4X10 (GAUZE/BANDAGES/DRESSINGS) ×1 IMPLANT
DRSG OPSITE POSTOP 4X6 (GAUZE/BANDAGES/DRESSINGS) IMPLANT
DRSG OPSITE POSTOP 4X8 (GAUZE/BANDAGES/DRESSINGS) IMPLANT
ELECT REM PT RETURN 15FT ADLT (MISCELLANEOUS) ×2 IMPLANT
GAUZE 4X4 16PLY ~~LOC~~+RFID DBL (SPONGE) IMPLANT
GLOVE BIO SURGEON STRL SZ 6 (GLOVE) ×4 IMPLANT
GLOVE BIO SURGEON STRL SZ 6.5 (GLOVE) ×3 IMPLANT
GLOVE BIOGEL PI IND STRL 6.5 (GLOVE) ×2 IMPLANT
GOWN STRL REUS W/ TWL LRG LVL3 (GOWN DISPOSABLE) ×4 IMPLANT
GOWN STRL REUS W/TWL LRG LVL3 (GOWN DISPOSABLE) ×4
HEMOSTAT ARISTA ABSORB 3G PWDR (HEMOSTASIS) IMPLANT
KIT BASIN OR (CUSTOM PROCEDURE TRAY) ×2 IMPLANT
KIT TURNOVER KIT A (KITS) IMPLANT
LIGASURE IMPACT 36 18CM CVD LR (INSTRUMENTS) ×1 IMPLANT
LOOP VESSEL MAXI BLUE (MISCELLANEOUS) IMPLANT
NDL HYPO 21X1.5 SAFETY (NEEDLE) ×2 IMPLANT
NEEDLE HYPO 21X1.5 SAFETY (NEEDLE) ×4
NS IRRIG 1000ML POUR BTL (IV SOLUTION) ×4 IMPLANT
PACK GENERAL/GYN (CUSTOM PROCEDURE TRAY) ×2 IMPLANT
RELOAD PROXIMATE 75MM BLUE (ENDOMECHANICALS)
RELOAD PROXIMATE TA60MM BLUE (ENDOMECHANICALS)
RELOAD STAPLE 60 BLU REG PROX (ENDOMECHANICALS) IMPLANT
RELOAD STAPLE 75 3.8 BLU REG (ENDOMECHANICALS) IMPLANT
RETRACTOR WND ALEXIS 18 MED (MISCELLANEOUS) IMPLANT
RETRACTOR WND ALEXIS 25 LRG (MISCELLANEOUS) IMPLANT
RTRCTR WOUND ALEXIS 18CM MED (MISCELLANEOUS)
RTRCTR WOUND ALEXIS 25CM LRG (MISCELLANEOUS) ×2
SHEET LAVH (DRAPES) ×2 IMPLANT
SLEEVE SUCTION CATH 165 (SLEEVE) ×1 IMPLANT
SOL PREP POV-IOD 4OZ 10% (MISCELLANEOUS) ×1 IMPLANT
STAPLER GUN LINEAR PROX 60 (STAPLE) IMPLANT
STAPLER PROXIMATE 75MM BLUE (STAPLE) IMPLANT
STAPLER SKIN PROX WIDE 3.9 (STAPLE) IMPLANT
SURGIFLO W/THROMBIN 8M KIT (HEMOSTASIS) IMPLANT
SUT MNCRL AB 4-0 PS2 18 (SUTURE) ×4 IMPLANT
SUT PDS AB 1 TP1 54 (SUTURE) ×4 IMPLANT
SUT SILK 3 0 SH CR/8 (SUTURE) IMPLANT
SUT VIC AB 0 CT1 36 (SUTURE) ×19 IMPLANT
SUT VIC AB 2-0 CT1 27 (SUTURE) ×4
SUT VIC AB 2-0 CT1 36 (SUTURE) ×3 IMPLANT
SUT VIC AB 2-0 CT1 TAPERPNT 27 (SUTURE) ×4 IMPLANT
SUT VIC AB 2-0 CT2 27 (SUTURE) ×2 IMPLANT
SUT VIC AB 2-0 SH 18 (SUTURE) IMPLANT
SUT VIC AB 2-0 SH 27 (SUTURE) ×4
SUT VIC AB 2-0 SH 27X BRD (SUTURE) ×2 IMPLANT
SUT VIC AB 3-0 CTX 36 (SUTURE) IMPLANT
SUT VIC AB 3-0 SH 18 (SUTURE) IMPLANT
SUT VIC AB 3-0 SH 27 (SUTURE) ×2
SUT VIC AB 3-0 SH 27X BRD (SUTURE) ×2 IMPLANT
SUT VIC AB 4-0 PS2 18 (SUTURE) ×1 IMPLANT
SYR 30ML LL (SYRINGE) ×4 IMPLANT
TOWEL OR 17X26 10 PK STRL BLUE (TOWEL DISPOSABLE) ×2 IMPLANT
TOWEL OR NON WOVEN STRL DISP B (DISPOSABLE) ×2 IMPLANT
TRAY FOLEY MTR SLVR 16FR STAT (SET/KITS/TRAYS/PACK) ×2 IMPLANT
UNDERPAD 30X36 HEAVY ABSORB (UNDERPADS AND DIAPERS) ×2 IMPLANT

## 2023-05-15 NOTE — Op Note (Signed)
GYNECOLOGIC ONCOLOGY OPERATIVE NOTE  Date of Service: 05/15/2023  Preoperative Diagnosis: Adnexal mass  Postoperative Diagnosis: Left ovarian mass, Adhesions, Retroperitoneal fibrosis  Procedures: Total abdominal hysterectomy, bilateral salpingo-oophorectomy, left ureterolysis, lysis of adhesions ((Modifer 22: increased duration of the procedure by >60 min due to complexity due adhesive disease requiring extensive meticulous lysis of adhesions, necessitating additional instrumentation for retraction and safe exposure)  Surgeon: Clide Cliff, MD  Assistants: Antionette Char, MD and (an MD assistant was necessary for tissue manipulation, management of robotic instrumentation, retraction and positioning due to the complexity of the case and hospital policies)  Anesthesia: General  Estimated Blood Loss: 200 mL    Fluids: 1000 ml, crystalloid  Urine Output: 375 ml, clear yellow  Findings: On abdominal exam, cystic and solid mass extending from the pelvis to the upper abdomen.  On entry to abdomen, no ascites.  Controlled cyst drainage with clear serous cyst fluid.  Large mass arising from the left adnexa.  Mass retroperitonealized, extending cephalad into the left paracolic gutter and inferiorly into the left perirectal space.  Adhesions of the rectum to the posterior uterus initially occluding the posterior cul-de-sac. Uterus mildly enlarged with multiple small subserosal fibroids and one larger intramural posterior fibroid. Right adnexa initially not visualized.  Ultimately identified to be adherent to the right ovarian fossa.  Bladder densely adherent to the anterior uterus.  Fibrosis in the left retroperitoneal space with the ureter adherent to the mass requiring complete ureterolysis from the pelvic brim to insertion the bladder.  Upper abdominal survey with smooth diaphragm, liver surface, stomach, bowel, and omentum.  No palpable pelvic or para-aortic lymphadenopathy.  IOFS c/w benign  stromal tumor.  Specimens:  ID Type Source Tests Collected by Time Destination  1 : Left tube and ovary Tissue PATH Gyn tumor resection SURGICAL PATHOLOGY Clide Cliff, MD 05/15/2023 1238   2 : Uterus, cervix, right tube and ovary Tissue PATH Gyn tumor resection SURGICAL PATHOLOGY Clide Cliff, MD 05/15/2023 1317   A : Pelvic Washings Body Fluid PATH Cytology Pelvic Washing CYTOLOGY - NON PAP Clide Cliff, MD 05/15/2023 1143     Complications:  None  Indications for Procedure: LUCINE HILFIKER is a 57 y.o. woman with a 27 cm abdominal pelvic cystic and solid abdominal pelvic mass with elevated and inhibin B.  Prior to the procedure, all risks, benefits, and alternatives were discussed and informed surgical consent was signed.  Procedure: Patient was taken to the operating room where general anesthesia was achieved.  She was positioned in dorsal lithotomy and prepped and draped.  A foley catheter was inserted into the bladder.  A vertical midline incision was made with the scalpel and the abdomen was entered sharply.  The abdomen and pelvis were surveyed with findings as documented above.  Pelvic washings were obtained.  A pursestring stitch was placed into the mass.  Using a gallbladder trocar, the cystic component of the mass was drained in a controlled fashion.  The gallbladder trocar was removed and the pursestring suture tied down.  A wound protector was placed followed by the Bookwalter retractor.  The bowels were packed into the upper abdomen.  The sigmoid colon was noted to be adherent to the pelvic mass.  Initial lysis of adhesions was performed with Metzenbaum scissors.  This was extended cephalad along the paracolic gutter.  The left round ligament was then cauterized and transected with the LigaSure device.  The broad ligament was opened posteriorly.  The retroperitoneum was dissected and noted to be  densely adherent to the pelvic mass.  Blunt dissection was performed to  separate the mass from the lateral pelvic structures.  The left ureter was identified.  The left infundibulopelvic ligament was isolated, clamped, cauterized, and transected with the LigaSure device followed by a free tie of 0 Vicryl.  The mass was then further mobilized off of the left sidewall with a combination of blunt and sharp dissection.  Complete ureterolysis was performed to mobilize the ureter off of the pelvic mass.  The posterior peritoneum was incised towards the pelvis.  At this time additional adhesions of the rectum to the posterior uterus were encountered which were off the posterior cul-de-sac.  These adhesions were lysed meticulously with Metzenbaum scissors and blunt dissection.  Once the rectum in the posterior cul-de-sac was completely mobilized, the adnexal mass was further isolated at the uterine cornua with electrocautery.  The utero-ovarian ligament and proximal fallopian tube were clamped.  This pedicle was then cauterized and transected with the LigaSure with the left tube and ovary handed off the field for frozen pathology.  Attention was turned to the right.  The right round ligament was cauterized and transected with the LigaSure.  The right retroperitoneum was entered and the right ureter identified.  Initially the right adnexa was not visualized.  Additional adhesions of the rectum to the posterior cul-de-sac and right pelvic sidewall were lysed sharply with Metzenbaums.  Following this lyse adhesions to the right ovary was noted to be adherent to the right ovarian fossa.  The right infundibulopelvic ligament was isolated, clamped, cauterized, and transected with the LigaSure device followed by 0 Vicryl free tie.  The peritoneum is then incised posteriorly to mobilize the adnexa off of the pelvic sidewall.  The broad ligament was then opened anteriorly and the bladder flap developed.  The bladder was noted to be densely adherent to the left anterior uterus.  The bladder was  dissected off of the uterus with a combination of sharp and blunt dissection.  With the bladder mobilized, the right uterine artery was skeletonized, clamped, transected, and suture-ligated.  An identical procedure was performed on the contralateral side.  Sequential clamps were used to transect the remainder of the broad and cardinal ligaments.  Each pedicle was suture-ligated.  Two curved clamps were placed below the cervix and the cervix and uterus were amputated from the vagina. The vaginal cuff was closed with 2 Heaney stitches at the apices and several figure-of-eight stitches in the midline of 0 Vicryl.  The abdomen and pelvis were inspected.  Additional electrocautery was used in the retroperitoneal space of the left paracolic gutter.  The left infundibulopelvic ligament was grasped and elevated and an additional tie of 0 Vicryl was placed around the pedicle for additional hemostasis. All operative sites were then found to be hemostatic. The fascia was closed with two running stitches of #1 PDS, tied separately in the midline. Exparel was injected at the incision site in standard fashion. The subcutaneous tissues were irrigated and hemostasis achieved. The subcutaneous space was approximated with 2-0 Vicryl in a running fashion. The skin was closed with a running 4-0 Vicryl in a deep dermal fashion followed by 4-0 monocryl in a subcuticular fashion.  The incision was then covered with surgical glue and a dressing.  Patient tolerated the procedure well. Sponge, lap, and instrument counts were correct.  2 gm of Ancef and 500mg  of metronidazole was administered prior to skin incision for routine perioperative antibiotics.  Patient was extubated and taken to  the PACU in stable condition.  Clide Cliff, MD Gynecologic Oncology

## 2023-05-15 NOTE — Anesthesia Preprocedure Evaluation (Signed)
Anesthesia Evaluation  Patient identified by MRN, date of birth, ID band Patient awake    Reviewed: Allergy & Precautions, H&P , NPO status , Patient's Chart, lab work & pertinent test results  Airway Mallampati: II   Neck ROM: full    Dental   Pulmonary Current Smoker   breath sounds clear to auscultation       Cardiovascular negative cardio ROS  Rhythm:regular Rate:Normal     Neuro/Psych CVA    GI/Hepatic ,,,(+)     substance abuse  alcohol use and marijuana use, Hepatitis -, C  Endo/Other    Renal/GU      Musculoskeletal   Abdominal   Peds  Hematology   Anesthesia Other Findings   Reproductive/Obstetrics                             Anesthesia Physical Anesthesia Plan  ASA: 3  Anesthesia Plan: General   Post-op Pain Management:    Induction: Intravenous  PONV Risk Score and Plan: 2 and Ondansetron, Dexamethasone, Midazolam and Treatment may vary due to age or medical condition  Airway Management Planned: Oral ETT  Additional Equipment:   Intra-op Plan:   Post-operative Plan: Extubation in OR  Informed Consent: I have reviewed the patients History and Physical, chart, labs and discussed the procedure including the risks, benefits and alternatives for the proposed anesthesia with the patient or authorized representative who has indicated his/her understanding and acceptance.     Dental advisory given  Plan Discussed with: CRNA, Anesthesiologist and Surgeon  Anesthesia Plan Comments:        Anesthesia Quick Evaluation

## 2023-05-15 NOTE — Transfer of Care (Signed)
Immediate Anesthesia Transfer of Care Note  Patient: Diana Harris  Procedure(s) Performed: TOTAL HYSTERECTOMY ABDOMINAL WITH SALPINGO-OOPHORECTOMY, LEFT URETEROLYSIS , LYSIS OF ADHESIONS (Bilateral)  Patient Location: PACU  Anesthesia Type:General  Level of Consciousness: awake, drowsy, and patient cooperative  Airway & Oxygen Therapy: Patient Spontanous Breathing and Patient connected to face mask oxygen  Post-op Assessment: Report given to RN and Post -op Vital signs reviewed and stable  Post vital signs: Reviewed and stable  Last Vitals:  Vitals Value Taken Time  BP 179/98 05/15/23 1441  Temp    Pulse 85 05/15/23 1445  Resp 20 05/15/23 1445  SpO2 100 % 05/15/23 1445  Vitals shown include unfiled device data.  Last Pain:  Vitals:   05/15/23 0922  TempSrc:   PainSc: 0-No pain         Complications: No notable events documented.

## 2023-05-15 NOTE — Anesthesia Postprocedure Evaluation (Signed)
Anesthesia Post Note  Patient: Diana Harris  Procedure(s) Performed: TOTAL HYSTERECTOMY ABDOMINAL WITH SALPINGO-OOPHORECTOMY, LEFT URETEROLYSIS , LYSIS OF ADHESIONS (Bilateral)     Patient location during evaluation: PACU Anesthesia Type: General Level of consciousness: awake and alert Pain management: pain level controlled Vital Signs Assessment: post-procedure vital signs reviewed and stable Respiratory status: spontaneous breathing, nonlabored ventilation, respiratory function stable and patient connected to nasal cannula oxygen Cardiovascular status: blood pressure returned to baseline and stable Postop Assessment: no apparent nausea or vomiting Anesthetic complications: no   No notable events documented.  Last Vitals:  Vitals:   05/15/23 1530 05/15/23 1545  BP: (!) 144/77 (!) 146/84  Pulse: 68 68  Resp: 10 13  Temp:    SpO2: 97% 96%    Last Pain:  Vitals:   05/15/23 1530  TempSrc:   PainSc: 4                  Theodor Mustin S

## 2023-05-15 NOTE — Brief Op Note (Signed)
05/15/2023  2:33 PM  PATIENT:  Valentino Saxon Totten  57 y.o. female  PRE-OPERATIVE DIAGNOSIS:  PELVIC MASS  POST-OPERATIVE DIAGNOSIS:  PELVIC MASS  PROCEDURE:  Procedure(s): TOTAL HYSTERECTOMY ABDOMINAL WITH SALPINGO-OOPHORECTOMY, LEFT URETEROLYSIS , LYSIS OF ADHESIONS (Bilateral)  SURGEON:  Surgeons and Role:    Clide Cliff, MD - Primary    * Antionette Char, MD - Assisting   ANESTHESIA:   general  EBL:  200 mL   BLOOD ADMINISTERED:none  DRAINS: none   LOCAL MEDICATIONS USED:  BUPIVICAINE  and OTHER EXPAREL  SPECIMEN:   ID Type Source Tests Collected by Time Destination  1 : Left tube and ovary Tissue PATH Gyn tumor resection SURGICAL PATHOLOGY Clide Cliff, MD 05/15/2023 1238   2 : Uterus, cervix, right tube and ovary Tissue PATH Gyn tumor resection SURGICAL PATHOLOGY Clide Cliff, MD 05/15/2023 1317   A : Pelvic Washings Body Fluid PATH Cytology Pelvic Washing CYTOLOGY - NON PAP Clide Cliff, MD 05/15/2023 1143      DISPOSITION OF SPECIMEN:  PATHOLOGY  COUNTS:  YES  TOURNIQUET:  * No tourniquets in log *  DICTATION: .Note written in EPIC  PLAN OF CARE: Admit to inpatient   PATIENT DISPOSITION:  PACU - hemodynamically stable.   Delay start of Pharmacological VTE agent (>24hrs) due to surgical blood loss or risk of bleeding: no

## 2023-05-15 NOTE — Discharge Instructions (Signed)
AFTER SURGERY INSTRUCTIONS   Return to work: 4-6 weeks if applicable   You will have a white honeycomb dressing over your larger incision. This dressing can be removed 5 days after surgery and you do not need to reapply a new dressing. Once you remove the dressing, you will notice that you have the surgical glue (dermabond) on the incision and this will peel off on its own. You can get this dressing wet in the shower the days after surgery prior to removal on the 5th day.    Activity: 1. Be up and out of the bed during the day.  Take a nap if needed.  You may walk up steps but be careful and use the hand rail.  Stair climbing will tire you more than you think, you may need to stop part way and rest.    2. No lifting or straining for 6 weeks over 10 pounds. No pushing, pulling, straining for 6 weeks.   3. No driving for around 1 week(s).  Do not drive if you are taking narcotic pain medicine and make sure that your reaction time has returned.    4. You can shower as soon as the next day after surgery. Shower daily.  Use your regular soap and water (not directly on the incision) and pat your incision(s) dry afterwards; don't rub.  No tub baths or submerging your body in water until cleared by your surgeon. If you have the soap that was given to you by pre-surgical testing that was used before surgery, you do not need to use it afterwards because this can irritate your incisions.    5. No sexual activity and nothing in the vagina for 10-12 weeks.   6. You may experience a small amount of clear drainage from your incisions, which is normal.  If the drainage persists, increases, or changes color please call the office.   7. Do not use creams, lotions, or ointments such as neosporin on your incisions after surgery until advised by your surgeon because they can cause removal of the dermabond glue on your incisions.     8. You may experience vaginal spotting after surgery or when the stitches at the top  of the vagina begin to dissolve.  The spotting is normal but if you experience heavy bleeding, call our office.   9. Take Tylenol or ibuprofen first for pain if you are able to take these medications and only use narcotic pain medication for severe pain not relieved by the Tylenol or Ibuprofen.  Monitor your Tylenol intake to a max of 4,000 mg in a 24 hour period. You can alternate these medications after surgery.   Diet: 1. Low sodium Heart Healthy Diet is recommended but you are cleared to resume your normal (before surgery) diet after your procedure.   2. It is safe to use a laxative, such as Miralax or Colace, if you have difficulty moving your bowels. You have been prescribed Sennakot-S to take at bedtime every evening after surgery to keep bowel movements regular and to prevent constipation.     Wound Care: 1. Keep clean and dry.  Shower daily.   Reasons to call the Doctor: Fever - Oral temperature greater than 100.4 degrees Fahrenheit Foul-smelling vaginal discharge Difficulty urinating Nausea and vomiting Increased pain at the site of the incision that is unrelieved with pain medicine. Difficulty breathing with or without chest pain New calf pain especially if only on one side Sudden, continuing increased vaginal bleeding with or  without clots.   Contacts: For questions or concerns you should contact:   Dr. Clide Cliff at [phone]   Warner Mccreedy, NP at [phone]   After Hours: call [phone] and have the GYN Oncologist paged/contacted (after 5 pm or on the weekends). You will speak with an after hours RN and let he or she know you have had surgery.   Messages sent via mychart are for non-urgent matters and are not responded to after hours so for urgent needs, please call the after hours number.

## 2023-05-15 NOTE — Anesthesia Procedure Notes (Addendum)
Procedure Name: Intubation Date/Time: 05/15/2023 11:06 AM  Performed by: Chinita Pester, CRNAPre-anesthesia Checklist: Patient identified, Emergency Drugs available, Suction available and Patient being monitored Patient Re-evaluated:Patient Re-evaluated prior to induction Oxygen Delivery Method: Circle System Utilized Preoxygenation: Pre-oxygenation with 100% oxygen Induction Type: IV induction Ventilation: Mask ventilation without difficulty Laryngoscope Size: Mac and 3 Grade View: Grade III Tube type: Oral Tube size: 7.0 mm Number of attempts: 1 Airway Equipment and Method: Stylet and Oral airway Placement Confirmation: ETT inserted through vocal cords under direct vision, positive ETCO2 and breath sounds checked- equal and bilateral Secured at: 21.5 cm Tube secured with: Tape Dental Injury: Teeth and Oropharynx as per pre-operative assessment  Comments: Right front tooth precariously loose at baseline, no contact with dentition during DL, teeth in tact

## 2023-05-15 NOTE — H&P (Signed)
Brief Pre-operative History & Physical  Patient name: Diana Harris CSN: [id number] MRN: [id number] Admit Date: 05/15/2023 Date of Surgery: 05/15/2023 Performing Service: Gynecology   Code Status: Full Code    Assessment & Plan    Makhayla is a 57 y.o. female with PELVIC MASS, who presents for: Procedure(s) (LRB): EXPLORATORY LAPAROTOMY (N/A) HYSTERECTOMY ABDOMINAL WITH SALPINGO-OOPHORECTOMY (Bilateral) POSSIBLE DEBULKING (N/A) POSSIBLE STAGING (N/A).   Consent obtained in office is accurate. Risks, benefits, and alternatives to surgery were reviewed, and all questions were answered.  Proceed to the OR as planned.     History of Present Illness:  Diana Harris is a 57 y.o. female with PELVIC MASS. She was recently seen in clinic, where a detailed HPI can be found. She was noted to benefit from: Procedure(s) (LRB): EXPLORATORY LAPAROTOMY (N/A) HYSTERECTOMY ABDOMINAL WITH SALPINGO-OOPHORECTOMY (Bilateral) POSSIBLE DEBULKING (N/A) POSSIBLE STAGING (N/A).    Allergies Patient has no known allergies.  Medications   Current Facility-Administered Medications  Medication Dose Route Frequency Provider Last Rate Last Admin   ceFAZolin (ANCEF) IVPB 2g/100 mL premix  2 g Intravenous On Call to OR Cross, Melissa D, NP       dexamethasone (DECADRON) injection 4 mg  4 mg Intravenous On Call to OR Cross, Melissa D, NP       metroNIDAZOLE (FLAGYL) IVPB 500 mg  500 mg Intravenous On Call to OR Cross, Melissa D, NP       scopolamine (TRANSDERM-SCOP) 1 MG/3DAYS 1.5 mg  1 patch Transdermal On Call to OR Warner Mccreedy D, NP   1.5 mg at 05/15/23 0953    Vital Signs BP (!) 134/99   Pulse 74   Temp 97.6 F (36.4 C) (Oral)   Resp 16   Ht 5\' 2"  (1.575 m)   Wt 142 lb (64.4 kg)   SpO2 97%   BMI 25.97 kg/m  Facility age limit for growth %iles is 20 years. Facility age limit for growth %iles is 20 years..   Physical Exam General: Well developed, appears stated age, in no acute  distress  Mental status: Alert and oriented x3 Cardiovascular: Normal Pulmonary: Symmetric chest rise, unlabored breathing Relevant System for Surgery: Surgical site examination deferred to the OR   Labs and Studies: Lab Results  Component Value Date   WBC 6.6 05/04/2023   HGB 13.2 05/04/2023   HCT 38.7 05/04/2023   PLT 194 05/04/2023    Lab Results  Component Value Date   INR 1.3 (H) 05/08/2023   APTT 32 02/04/2021   \

## 2023-05-16 ENCOUNTER — Encounter (HOSPITAL_COMMUNITY): Payer: Self-pay | Admitting: Psychiatry

## 2023-05-16 LAB — CBC
HCT: 36.1 % (ref 36.0–46.0)
Hemoglobin: 12.8 g/dL (ref 12.0–15.0)
MCH: 32.7 pg (ref 26.0–34.0)
MCHC: 35.5 g/dL (ref 30.0–36.0)
MCV: 92.3 fL (ref 80.0–100.0)
Platelets: 184 10*3/uL (ref 150–400)
RBC: 3.91 MIL/uL (ref 3.87–5.11)
RDW: 13.7 % (ref 11.5–15.5)
WBC: 14.8 10*3/uL — ABNORMAL HIGH (ref 4.0–10.5)
nRBC: 0 % (ref 0.0–0.2)

## 2023-05-16 LAB — BASIC METABOLIC PANEL
Anion gap: 8 (ref 5–15)
BUN: 10 mg/dL (ref 6–20)
CO2: 22 mmol/L (ref 22–32)
Calcium: 8.8 mg/dL — ABNORMAL LOW (ref 8.9–10.3)
Chloride: 104 mmol/L (ref 98–111)
Creatinine, Ser: 0.65 mg/dL (ref 0.44–1.00)
GFR, Estimated: >60 mL/min (ref >60)
Glucose, Bld: 154 mg/dL — ABNORMAL HIGH (ref 70–99)
Potassium: 4 mmol/L (ref 3.5–5.1)
Sodium: 134 mmol/L — ABNORMAL LOW (ref 135–145)

## 2023-05-16 NOTE — Plan of Care (Signed)
  Problem: Clinical Measurements: Goal: Will remain free from infection 05/16/2023 0715 by Marshell Garfinkel D, RN Outcome: Progressing 05/16/2023 0714 by Marshell Garfinkel D, RN Outcome: Progressing   Problem: Activity: Goal: Risk for activity intolerance will decrease Outcome: Progressing

## 2023-05-16 NOTE — TOC Initial Note (Signed)
Transition of Care [facility]) - Initial/Assessment Note   Patient Details  Name: Diana Harris MRN: [id number] Date of Birth: 1965/08/01  [facility] of [facility]) CM/SW Contact:    Ewing Schlein, LCSW Phone Number: 05/16/2023, 1:29 PM  Clinical Narrative: [facility] consulted for ETOH/substance use resources. Patient declined resources at this time.  Expected Discharge Plan: Home/Self Care Barriers to Discharge: Continued Medical Work up  Patient Goals and CMS Choice Choice offered to / list presented to : NA  Expected Discharge Plan and Services In-house Referral: Clinical Social Work Post Acute Care Choice: NA Living arrangements for the past 2 months: Apartment              DME Arranged: N/A DME Agency: NA  Prior Living Arrangements/Services Living arrangements for the past 2 months: Apartment Patient language and need for interpreter reviewed:: Yes Do you feel safe going back to the place where you live?: Yes      Need for Family Participation in Patient Care: No (Comment) Care giver support system in place?: Yes (comment) Criminal Activity/Legal Involvement Pertinent to Current Situation/Hospitalization: No - Comment as needed  Activities of Daily Living ADL Screening (condition at time of admission) Independently performs ADLs?: Yes (appropriate for developmental age) Is the patient deaf or have difficulty hearing?: No Does the patient have difficulty seeing, even when wearing glasses/contacts?: No Does the patient have difficulty concentrating, remembering, or making decisions?: No  Emotional Assessment Attitude/Demeanor/Rapport: Engaged Affect (typically observed): Appropriate Orientation: : Oriented to Self, Oriented to Place, Oriented to  Time, Oriented to Situation Alcohol / Substance Use: Alcohol Use Psych Involvement: No (comment)  Admission diagnosis:  Pelvic mass in female [R19.00] Pelvic mass [R19.00] Patient Active Problem List   Diagnosis Date Noted   Pelvic  mass in female 05/15/2023   Pelvic mass 03/06/2023   Poor social situation 05/16/2022   Drug interaction 04/19/2022   Alcohol use 04/19/2022   Abdominal mass 04/04/2022   Hepatitis C infection 03/25/2022   Polysubstance abuse (HCC) 02/15/2022   Poor dentition 02/14/2021   CVA (cerebral vascular accident) (HCC) 02/04/2021   Ischemic stroke (HCC) 08/22/2017   Tobacco use disorder 08/22/2017   PCP:  Vonna Drafts, MD Pharmacy:   [facility] [phone] - [place] - [address] AT [facility] [address] [place] [address] Phone: [phone] Fax: [phone]  Social Determinants of Health (SDOH) Social History: SDOH Screenings   Food Insecurity: No Food Insecurity (05/15/2023)  Recent Concern: Food Insecurity - Food Insecurity Present (04/04/2023)  Housing: Low Risk  (05/15/2023)  Transportation Needs: No Transportation Needs (05/15/2023)  Utilities: Not At Risk (05/15/2023)  Depression (PHQ2-9): Low Risk  (04/04/2023)  Tobacco Use: High Risk (05/15/2023)   SDOH Interventions:    Readmission Risk Interventions     No data to display

## 2023-05-16 NOTE — Progress Notes (Signed)
Mobility Specialist - Progress Note   05/16/23 1025  Mobility  Activity Ambulated with assistance in hallway  Level of Assistance Standby assist, set-up cues, supervision of patient - no hands on  Assistive Device Other (Comment) (IV Pole)  Distance Ambulated (ft) 160 ft  Activity Response Tolerated well  Mobility Referral Yes  $Mobility charge 1 Mobility  Mobility Specialist Start Time (ACUTE ONLY) 0920  Mobility Specialist Stop Time (ACUTE ONLY) [id number]  Mobility Specialist Time Calculation (min) (ACUTE ONLY) 8 min   Pt received in bed and agreeable to mobility. Pt c/o abdomen cramping during session which halted further ambulation. Pt to bed after session with all needs met.    [facility]

## 2023-05-16 NOTE — Progress Notes (Signed)
GYN Oncology Progress Note  1 Day Post-Op Procedure(s) (LRB): TOTAL HYSTERECTOMY ABDOMINAL WITH SALPINGO-OOPHORECTOMY, LEFT URETEROLYSIS , LYSIS OF ADHESIONS (Bilateral)  Subjective: Patient reports doing "so so" this am. Having abdominal soreness relieved with oral medications. She has been voiding since foley removal. Ambulated in the rooms without issues with assist. No flatus. Reports decreased appetite but denies nausea, emesis. Denies chest pain, dyspnea, anxiety, dizziness, lightheadedness. Does not feel she needs a nicotine patch at this time. No concerns or needs voiced.    Objective: Vital signs in last 24 hours: Temp:  [97.5 F (36.4 C)-99.1 F (37.3 C)] 98.9 F (37.2 C) (11/13 0452) Pulse Rate:  [61-83] 61 (11/13 0452) Resp:  [9-21] 18 (11/13 0452) BP: (96-164)/(62-99) 102/62 (11/13 0452) SpO2:  [93 %-100 %] 100 % (11/13 0452) Weight:  [142 lb (64.4 kg)] 142 lb (64.4 kg) (11/12 0922) Last BM Date : 05/14/23  Intake/Output from previous day: 11/12 0701 - 11/13 0700 In: 2239.2 [P.O.:720; I.V.:1519.2] Out: 3086 [Urine:2075; Blood:200]  Physical Examination: General: alert, cooperative, and no distress Resp: clear to auscultation bilaterally Cardio: regular rate and rhythm, S1, S2 normal, no murmur, click, rub or gallop GI: incision: midline abdominal incision with op site dressing in place with no drainage noted underneath and abdomen soft, slightly distended, hypoactive bowel sounds. Binder in place. Extremities: extremities normal, atraumatic, no cyanosis or edema. SCDs connected.  Labs: WBC/Hgb/Hct/Plts:  14.8/12.8/36.1/184 (11/13 5784) BUN/Cr/glu/ALT/AST/amyl/lip:  10/0.65/--/--/--/--/-- (11/13 0442)  Assessment: 57 y.o. s/p Procedure(s): TOTAL HYSTERECTOMY ABDOMINAL WITH SALPINGO-OOPHORECTOMY, LEFT URETEROLYSIS , LYSIS OF ADHESIONS: stable Pain:  Pain is well-controlled on PRN medications.  Heme: Hgb 12.8 and Hct 36.1 this am. Appropriate given preop values and  surgical losses.   ID: WBC 14.8-given decadron intra-op along with IV antibiotic, elevated felt to be reactive. No sign of infection at this time.  CV: BP and HR stable. Continue to monitor while inpatient.  GI:  Tolerating po: yes, small amount. +decreased appetite.   GU: Creatinine 0.65 this am. Adequate output reported. Voiding since foley removal.     FEN: No critical values on am labs.  Prophylaxis: Lovenox and SCDs ordered.  Plan: IV to saline lock if diet tolerated this am and at lunch Encourage increasing mobility with assist When meeting milestones including passing flatus, will plan for discharge home Continue plan of care   LOS: 1 day    Doylene Bode 05/16/2023, 7:41 AM

## 2023-05-16 NOTE — Plan of Care (Signed)
?  Problem: Clinical Measurements: ?Goal: Will remain free from infection ?Outcome: Progressing ?  ?

## 2023-05-17 LAB — CBC
HCT: 33.6 % — ABNORMAL LOW (ref 36.0–46.0)
Hemoglobin: 11.8 g/dL — ABNORMAL LOW (ref 12.0–15.0)
MCH: 33 pg (ref 26.0–34.0)
MCHC: 35.1 g/dL (ref 30.0–36.0)
MCV: 93.9 fL (ref 80.0–100.0)
Platelets: 156 10*3/uL (ref 150–400)
RBC: 3.58 MIL/uL — ABNORMAL LOW (ref 3.87–5.11)
RDW: 13.9 % (ref 11.5–15.5)
WBC: 7.4 10*3/uL (ref 4.0–10.5)
nRBC: 0 % (ref 0.0–0.2)

## 2023-05-17 LAB — BASIC METABOLIC PANEL
Anion gap: 8 (ref 5–15)
BUN: 10 mg/dL (ref 6–20)
CO2: 24 mmol/L (ref 22–32)
Calcium: 8.5 mg/dL — ABNORMAL LOW (ref 8.9–10.3)
Chloride: 104 mmol/L (ref 98–111)
Creatinine, Ser: 0.55 mg/dL (ref 0.44–1.00)
GFR, Estimated: >60 mL/min (ref >60)
Glucose, Bld: 102 mg/dL — ABNORMAL HIGH (ref 70–99)
Potassium: 4 mmol/L (ref 3.5–5.1)
Sodium: 136 mmol/L (ref 135–145)

## 2023-05-17 MED ORDER — SIMETHICONE 80 MG PO CHEW
80.0000 mg | CHEWABLE_TABLET | Freq: Four times a day (QID) | ORAL | Status: DC
  Administered 2023-05-17 – 2023-05-20 (×13): 80 mg via ORAL
  Filled 2023-05-17 (×13): qty 1

## 2023-05-17 NOTE — Progress Notes (Signed)
Mobility Specialist - Progress Note   05/17/23 1042  Mobility  Activity Ambulated with assistance in hallway  Level of Assistance Modified independent, requires aide device or extra time  Assistive Device Other (Comment) (Hallway Rails)  Distance Ambulated (ft) 120 ft  Activity Response Tolerated well  Mobility Referral Yes  $Mobility charge 1 Mobility  Mobility Specialist Start Time (ACUTE ONLY) [id number]  Mobility Specialist Stop Time (ACUTE ONLY) 0934  Mobility Specialist Time Calculation (min) (ACUTE ONLY) 7 min   Pt received in bed and agreeable to mobility. No complaints during session. Pt to bed after session with all needs met.    [facility]

## 2023-05-17 NOTE — Progress Notes (Signed)
GYN Oncology Progress Note  2 Days Post-Op Procedure(s) (LRB): TOTAL HYSTERECTOMY ABDOMINAL WITH SALPINGO-OOPHORECTOMY, LEFT URETEROLYSIS , LYSIS OF ADHESIONS (Bilateral)  Subjective: Patient reports doing well this am. Having abdominal soreness/plan relieved with oral medications. She has been voiding without difficulty since foley removal. Ambulated in the halls without issues with assist. No flatus. Reports improvement in decreased appetite and denies nausea, emesis. Denies chest pain, dyspnea, anxiety, dizziness, lightheadedness. No concerns or needs voiced.    Objective: Vital signs in last 24 hours: Temp:  [97.5 F (36.4 C)-98.6 F (37 C)] 98.2 F (36.8 C) (11/14 0600) Pulse Rate:  [55-66] 55 (11/14 0600) Resp:  [18] 18 (11/14 0600) BP: (106-147)/(65-73) 106/73 (11/14 0600) SpO2:  [95 %-98 %] 96 % (11/14 0600) Last BM Date : 05/14/23  Intake/Output from previous day: 11/13 0701 - 11/14 0700 In: 991.7 [P.O.:720; I.V.:271.7] Out: 1650 [Urine:1650]  Physical Examination: General: alert, cooperative, and no distress Resp: clear to auscultation bilaterally Cardio: regular rate and rhythm, S1, S2 normal, no murmur, click, rub or gallop GI: incision: midline abdominal incision with op site dressing in place with no drainage noted underneath and abdomen soft, slightly distended, tympanic on percussion, hypoactive bowel sounds. Binder in place. Extremities: extremities normal, atraumatic, no cyanosis or edema. SCDs not connected.  Labs: WBC/Hgb/Hct/Plts:  7.4/11.8/33.6/156 (11/14 0440) BUN/Cr/glu/ALT/AST/amyl/lip:  10/0.55/--/--/--/--/-- (11/14 0440)  Assessment: 57 y.o. s/p Procedure(s): TOTAL HYSTERECTOMY ABDOMINAL WITH SALPINGO-OOPHORECTOMY, LEFT URETEROLYSIS , LYSIS OF ADHESIONS: stable Pain:  Pain is well-controlled on PRN medications.  Heme: Hgb 11.8 and Hct 33.6 this am from 12.8 and Hct 36.1 yesterday am. Appropriate given preop values and surgical losses.   ID: WBC 7.4  from 14.8 yesterday-given decadron intra-op along with IV antibiotic, elevation felt to be reactive. No sign of infection at this time.  CV: BP and HR stable. Brady at times- asymptomatic. Continue to monitor while inpatient.  GI:  Tolerating po: yes, small amount. +decreased appetite.   GU: Creatinine 0.55 this am. Adequate output reported. Voiding since foley removal.     FEN: No critical values on am labs.  Prophylaxis: Lovenox and SCDs ordered.  Plan: Simethicone added for gas pains Encourage increasing mobility with assist When meeting milestones including passing flatus, will plan for discharge home Continue plan of care   LOS: 2 days    Doylene Bode 05/17/2023, 9:10 AM

## 2023-05-17 NOTE — Plan of Care (Signed)
  Problem: Education: Goal: Knowledge of General Education information will improve Description: Including pain rating scale, medication(s)/side effects and non-pharmacologic comfort measures Outcome: Progressing   Problem: Health Behavior/Discharge Planning: Goal: Ability to manage health-related needs will improve Outcome: Progressing   Problem: Clinical Measurements: Goal: Ability to maintain clinical measurements within normal limits will improve Outcome: Progressing Goal: Will remain free from infection Outcome: Progressing Goal: Diagnostic test results will improve Outcome: Progressing Goal: Respiratory complications will improve Outcome: Progressing Goal: Cardiovascular complication will be avoided Outcome: Progressing   Problem: Activity: Goal: Risk for activity intolerance will decrease Outcome: Progressing   Problem: Nutrition: Goal: Adequate nutrition will be maintained Outcome: Progressing   Problem: Coping: Goal: Level of anxiety will decrease Outcome: Progressing   Problem: Elimination: Goal: Will not experience complications related to bowel motility Outcome: Progressing Goal: Will not experience complications related to urinary retention Outcome: Progressing   Problem: Pain Management: Goal: General experience of comfort will improve Outcome: Progressing   Problem: Safety: Goal: Ability to remain free from injury will improve Outcome: Progressing   Problem: Skin Integrity: Goal: Risk for impaired skin integrity will decrease Outcome: Progressing   Problem: Education: Goal: Knowledge of the prescribed therapeutic regimen will improve Outcome: Progressing Goal: Understanding of sexual limitations or changes related to disease process or condition will improve Outcome: Progressing Goal: Individualized Educational Video(s) Outcome: Progressing   Problem: Self-Concept: Goal: Communication of feelings regarding changes in body function or  appearance will improve Outcome: Progressing   Problem: Skin Integrity: Goal: Demonstration of wound healing without infection will improve Outcome: Progressing   Problem: Education: Goal: Knowledge of General Education information will improve Description: Including pain rating scale, medication(s)/side effects and non-pharmacologic comfort measures Outcome: Progressing   Problem: Health Behavior/Discharge Planning: Goal: Ability to manage health-related needs will improve Outcome: Progressing   Problem: Clinical Measurements: Goal: Ability to maintain clinical measurements within normal limits will improve Outcome: Progressing Goal: Will remain free from infection Outcome: Progressing Goal: Diagnostic test results will improve Outcome: Progressing Goal: Respiratory complications will improve Outcome: Progressing Goal: Cardiovascular complication will be avoided Outcome: Progressing   Problem: Activity: Goal: Risk for activity intolerance will decrease Outcome: Progressing   Problem: Nutrition: Goal: Adequate nutrition will be maintained Outcome: Progressing   Problem: Coping: Goal: Level of anxiety will decrease Outcome: Progressing   Problem: Elimination: Goal: Will not experience complications related to bowel motility Outcome: Progressing Goal: Will not experience complications related to urinary retention Outcome: Progressing   Problem: Pain Management: Goal: General experience of comfort will improve Outcome: Progressing   Problem: Safety: Goal: Ability to remain free from injury will improve Outcome: Progressing   Problem: Skin Integrity: Goal: Risk for impaired skin integrity will decrease Outcome: Progressing

## 2023-05-18 LAB — CBC
HCT: 37.4 % (ref 36.0–46.0)
Hemoglobin: 12.6 g/dL (ref 12.0–15.0)
MCH: 31.9 pg (ref 26.0–34.0)
MCHC: 33.7 g/dL (ref 30.0–36.0)
MCV: 94.7 fL (ref 80.0–100.0)
Platelets: 178 10*3/uL (ref 150–400)
RBC: 3.95 MIL/uL (ref 3.87–5.11)
RDW: 13.4 % (ref 11.5–15.5)
WBC: 7.4 10*3/uL (ref 4.0–10.5)
nRBC: 0 % (ref 0.0–0.2)

## 2023-05-18 LAB — BASIC METABOLIC PANEL
Anion gap: 8 (ref 5–15)
BUN: 11 mg/dL (ref 6–20)
CO2: 26 mmol/L (ref 22–32)
Calcium: 9 mg/dL (ref 8.9–10.3)
Chloride: 99 mmol/L (ref 98–111)
Creatinine, Ser: 0.78 mg/dL (ref 0.44–1.00)
GFR, Estimated: >60 mL/min (ref >60)
Glucose, Bld: 110 mg/dL — ABNORMAL HIGH (ref 70–99)
Potassium: 3.6 mmol/L (ref 3.5–5.1)
Sodium: 133 mmol/L — ABNORMAL LOW (ref 135–145)

## 2023-05-18 LAB — CYTOLOGY - NON PAP

## 2023-05-18 NOTE — Progress Notes (Addendum)
GYN Oncology Progress Note  Patient is alert, oriented, in no acute distress, resting in bed. States she has only passed flatus twice today. No BM but feels she may be having one soon. Feels abdomen rumbling. No nausea or emesis. Tolerating diet. Ambulating in the hall without difficulty. Pain is managed with oral medication and stating sharps cramps are better with meds. No needs or concerns voiced. Advised we will continue to monitor until more return of bowel function.   Active bowel sounds, abdomen remains distended, tympanic. Op site dressing intact with no drainage noted underneath.  Continue current plan of care.

## 2023-05-18 NOTE — Plan of Care (Signed)
Plan of Care reviewed. 

## 2023-05-18 NOTE — Progress Notes (Signed)
Mobility Specialist - Progress Note   05/18/23 0953  Mobility  Activity Ambulated independently in hallway  Level of Assistance Independent  Assistive Device None  Distance Ambulated (ft) 160 ft  Activity Response Tolerated well  Mobility Referral Yes  $Mobility charge 1 Mobility  Mobility Specialist Start Time (ACUTE ONLY) [id number]  Mobility Specialist Stop Time (ACUTE ONLY) 0936  Mobility Specialist Time Calculation (min) (ACUTE ONLY) 7 min   Pt received in bed and agreeable to mobility. No complaints during session. Pt to bed after session with all needs met.   [facility]

## 2023-05-18 NOTE — Progress Notes (Signed)
GYN Oncology Progress Note  3 Days Post-Op Procedure(s) (LRB): TOTAL HYSTERECTOMY ABDOMINAL WITH SALPINGO-OOPHORECTOMY, LEFT URETEROLYSIS , LYSIS OF ADHESIONS (Bilateral)  Subjective: Patient reports having sharp gas pains in her abdomen intermittently. She has been ambulating in the halls and tolerating this well. No nausea or emesis reported. Pain relieved by prn medications. She has been voiding without difficulty since foley removal. Small amount of flatus passed this am. Tolerating diet. Denies chest pain, dyspnea, anxiety, dizziness, lightheadedness. No concerns or needs voiced.    Objective: Vital signs in last 24 hours: Temp:  [97.5 F (36.4 C)-99.5 F (37.5 C)] 98.3 F (36.8 C) (11/15 0518) Pulse Rate:  [72-88] 88 (11/15 0518) Resp:  [16-17] 17 (11/15 0518) BP: (99-138)/(63-79) 118/79 (11/15 0518) SpO2:  [95 %-99 %] 95 % (11/15 0518) Last BM Date : 05/14/23  Intake/Output from previous day: 11/14 0701 - 11/15 0700 In: 240 [P.O.:240] Out: 1301 [Urine:1301]  Physical Examination: General: alert, cooperative, and no distress Resp: clear to auscultation bilaterally Cardio: regular rate and rhythm, S1, S2 normal, no murmur, click, rub or gallop GI: incision: midline abdominal incision with op site dressing in place with no drainage noted underneath and abdomen soft, more distended, tympanic on percussion, hypoactive bowel sounds. Binder in place. Extremities: extremities normal, atraumatic, no cyanosis or edema. SCDs not connected.  Labs: WBC/Hgb/Hct/Plts:  7.4/12.6/37.4/178 (11/15 6045) BUN/Cr/glu/ALT/AST/amyl/lip:  11/0.78/--/--/--/--/-- (11/15 4098)  Assessment: 57 y.o. s/p Procedure(s): TOTAL HYSTERECTOMY ABDOMINAL WITH SALPINGO-OOPHORECTOMY, LEFT URETEROLYSIS , LYSIS OF ADHESIONS: stable Pain:  Pain is well-controlled on PRN medications.  Heme: Hgb 12.6 from 11.8 and Hct 37.4 this am from 33.6-overall stable. Appropriate given preop values and surgical losses.   ID:  WBC 7.4 this am -given decadron intra-op along with IV antibiotic, elevation post-op felt to be reactive. No sign of infection at this time.  CV: BP and HR stable. Continue to monitor while inpatient.  GI:  Tolerating po: yes. Antiemetics ordered if needed. Awaiting return of bowel function.  GU: Creatinine 0.78 this am. Adequate output reported. Voiding since foley removal.     FEN: No critical values on am labs.  Prophylaxis: Lovenox and SCDs ordered.  Plan: Encourage increasing mobility with assist When meeting milestones including passing more flatus, will plan for discharge home Continue plan of care   LOS: 3 days    Doylene Bode 05/18/2023, 7:20 AM

## 2023-05-19 LAB — BASIC METABOLIC PANEL
Anion gap: 9 (ref 5–15)
BUN: 16 mg/dL (ref 6–20)
CO2: 27 mmol/L (ref 22–32)
Calcium: 9.2 mg/dL (ref 8.9–10.3)
Chloride: 93 mmol/L — ABNORMAL LOW (ref 98–111)
Creatinine, Ser: 0.89 mg/dL (ref 0.44–1.00)
GFR, Estimated: >60 mL/min (ref >60)
Glucose, Bld: 134 mg/dL — ABNORMAL HIGH (ref 70–99)
Potassium: 3.7 mmol/L (ref 3.5–5.1)
Sodium: 129 mmol/L — ABNORMAL LOW (ref 135–145)

## 2023-05-19 NOTE — Progress Notes (Signed)
Mobility Specialist - Progress Note   05/19/23 1052  Mobility  Activity Ambulated independently in hallway  Level of Assistance Independent  Assistive Device None  Distance Ambulated (ft) 250 ft  Activity Response Tolerated well  Mobility Referral Yes  $Mobility charge 1 Mobility  Mobility Specialist Start Time (ACUTE ONLY) 1019  Mobility Specialist Stop Time (ACUTE ONLY) 1026  Mobility Specialist Time Calculation (min) (ACUTE ONLY) 7 min   Pt received in bed and agreeable to mobility. No complaints during session. Pt to bed after session with all needs met.    [facility]

## 2023-05-19 NOTE — Plan of Care (Signed)
  Problem: Education: Goal: Knowledge of General Education information will improve Description Including pain rating scale, medication(s)/side effects and non-pharmacologic comfort measures Outcome: Progressing   

## 2023-05-19 NOTE — Plan of Care (Signed)
  Problem: Education: Goal: Knowledge of General Education information will improve Description: Including pain rating scale, medication(s)/side effects and non-pharmacologic comfort measures Outcome: Progressing   Problem: Health Behavior/Discharge Planning: Goal: Ability to manage health-related needs will improve Outcome: Progressing   Problem: Clinical Measurements: Goal: Ability to maintain clinical measurements within normal limits will improve Outcome: Progressing Goal: Will remain free from infection Outcome: Progressing Goal: Diagnostic test results will improve Outcome: Progressing Goal: Respiratory complications will improve Outcome: Progressing Goal: Cardiovascular complication will be avoided Outcome: Progressing   Problem: Activity: Goal: Risk for activity intolerance will decrease Outcome: Progressing   Problem: Nutrition: Goal: Adequate nutrition will be maintained Outcome: Progressing   Problem: Coping: Goal: Level of anxiety will decrease Outcome: Progressing   Problem: Elimination: Goal: Will not experience complications related to bowel motility Outcome: Progressing Goal: Will not experience complications related to urinary retention Outcome: Progressing   Problem: Pain Management: Goal: General experience of comfort will improve Outcome: Progressing   Problem: Safety: Goal: Ability to remain free from injury will improve Outcome: Progressing   Problem: Skin Integrity: Goal: Risk for impaired skin integrity will decrease Outcome: Progressing   Problem: Education: Goal: Knowledge of the prescribed therapeutic regimen will improve Outcome: Progressing Goal: Understanding of sexual limitations or changes related to disease process or condition will improve Outcome: Progressing Goal: Individualized Educational Video(s) Outcome: Progressing   Problem: Self-Concept: Goal: Communication of feelings regarding changes in body function or  appearance will improve Outcome: Progressing   Problem: Skin Integrity: Goal: Demonstration of wound healing without infection will improve Outcome: Progressing   Problem: Education: Goal: Knowledge of General Education information will improve Description: Including pain rating scale, medication(s)/side effects and non-pharmacologic comfort measures Outcome: Progressing   Problem: Health Behavior/Discharge Planning: Goal: Ability to manage health-related needs will improve Outcome: Progressing   Problem: Clinical Measurements: Goal: Ability to maintain clinical measurements within normal limits will improve Outcome: Progressing Goal: Will remain free from infection Outcome: Progressing Goal: Diagnostic test results will improve Outcome: Progressing Goal: Respiratory complications will improve Outcome: Progressing Goal: Cardiovascular complication will be avoided Outcome: Progressing   Problem: Activity: Goal: Risk for activity intolerance will decrease Outcome: Progressing   Problem: Nutrition: Goal: Adequate nutrition will be maintained Outcome: Progressing   Problem: Coping: Goal: Level of anxiety will decrease Outcome: Progressing   Problem: Elimination: Goal: Will not experience complications related to bowel motility Outcome: Progressing Goal: Will not experience complications related to urinary retention Outcome: Progressing   Problem: Pain Management: Goal: General experience of comfort will improve Outcome: Progressing   Problem: Safety: Goal: Ability to remain free from injury will improve Outcome: Progressing   Problem: Skin Integrity: Goal: Risk for impaired skin integrity will decrease Outcome: Progressing

## 2023-05-19 NOTE — Progress Notes (Signed)
GYN Oncology Progress Note  4 Days Post-Op Procedure(s) (LRB): TOTAL HYSTERECTOMY ABDOMINAL WITH SALPINGO-OOPHORECTOMY, LEFT URETEROLYSIS , LYSIS OF ADHESIONS (Bilateral)  Subjective: Patient reports overall doing well.  Pain is well-controlled.  Ambulating in hallway.  Denies nausea or vomiting.  Does report burping.  Reports that she is passing flatus but no bowel movement.  Feels that her abdomen is slightly bigger today.  Tolerating regular diet.  Objective: Vital signs in last 24 hours: Temp:  [98.3 F (36.8 C)-99.5 F (37.5 C)] 98.4 F (36.9 C) (11/16 0524) Pulse Rate:  [79-100] 100 (11/16 0524) Resp:  [16-18] 16 (11/16 0524) BP: (110-128)/(76-89) 128/87 (11/16 0524) SpO2:  [96 %-97 %] 96 % (11/16 0524) Last BM Date : 05/14/23  Intake/Output from previous day: 11/15 0701 - 11/16 0700 In: 1080 [P.O.:1080] Out: 1650 [Urine:1650]  Physical Examination: General: alert, cooperative, and no distress Resp: clear to auscultation bilaterally Cardio: regular rate and rhythm, S1, S2 normal, no murmur, click, rub or gallop GI: incision: midline abdominal incision with op site dressing in place with no drainage noted underneath and abdomen soft, more distended, tympanic on percussion, normal bowel sounds. Extremities: extremities normal, atraumatic, no cyanosis or edema. SCDs not connected.  Labs:      Assessment: 57 y.o. s/p Procedure(s): TOTAL HYSTERECTOMY ABDOMINAL WITH SALPINGO-OOPHORECTOMY, LEFT URETEROLYSIS , LYSIS OF ADHESIONS: stable Pain:  Pain is well-controlled on PRN medications.  Heme: Hgb has been stable. No recheck today.    ID: Given decadron intra-op along with IV antibiotic. No sign of infection at this time.  CV: BP and HR stable. Continue to monitor while inpatient.  GI:  Tolerating po: yes. Antiemetics ordered if needed. Awaiting bowel movement given increased distension despite flatus.  GU: BMP pending this AM. Adequate output reported. Voiding since  foley removal.     FEN: BMP pending this morning  Prophylaxis: Lovenox and SCDs ordered.  Plan: Encourage increasing mobility with assist Follow-up BMP this morning. Given increased abdominal distension, will continue to monitor bowel function and await bowel movement prior to discharge given concern for possible pending ileus. Possible d/c later today or tomorrow. Continue plan of care   LOS: 4 days    Keerthana Vanrossum 05/19/2023, 6:55 AM

## 2023-05-20 LAB — BASIC METABOLIC PANEL
Anion gap: 10 (ref 5–15)
BUN: 21 mg/dL — ABNORMAL HIGH (ref 6–20)
CO2: 26 mmol/L (ref 22–32)
Calcium: 8.7 mg/dL — ABNORMAL LOW (ref 8.9–10.3)
Chloride: 97 mmol/L — ABNORMAL LOW (ref 98–111)
Creatinine, Ser: 1.06 mg/dL — ABNORMAL HIGH (ref 0.44–1.00)
GFR, Estimated: >60 mL/min (ref >60)
Glucose, Bld: 110 mg/dL — ABNORMAL HIGH (ref 70–99)
Potassium: 4.4 mmol/L (ref 3.5–5.1)
Sodium: 133 mmol/L — ABNORMAL LOW (ref 135–145)

## 2023-05-20 MED ORDER — NYSTATIN 100000 UNIT/ML MT SUSP: 5.0000 mL | Freq: Four times a day (QID) | OROMUCOSAL | 0 refills | Status: AC

## 2023-05-20 MED ORDER — SENNOSIDES-DOCUSATE SODIUM 8.6-50 MG PO TABS
2.0000 | ORAL_TABLET | Freq: Every day | ORAL | 0 refills | Status: DC
  Filled 2023-05-20: qty 30, 15d supply, fill #0

## 2023-05-20 MED ORDER — NYSTATIN 100000 UNIT/ML MT SUSP
5.0000 mL | Freq: Four times a day (QID) | OROMUCOSAL | 0 refills | Status: DC
  Filled 2023-05-20: qty 60, 3d supply, fill #0

## 2023-05-20 MED ORDER — OXYCODONE HCL 5 MG PO TABS: 5.0000 mg | ORAL_TABLET | ORAL | 0 refills | Status: AC | PRN

## 2023-05-20 MED ORDER — BISACODYL 10 MG RE SUPP
10.0000 mg | Freq: Once | RECTAL | Status: AC
Start: 2023-05-20 — End: 2023-05-20
  Administered 2023-05-20: 10 mg via RECTAL
  Filled 2023-05-20: qty 1

## 2023-05-20 MED ORDER — OXYCODONE HCL 5 MG PO TABS
5.0000 mg | ORAL_TABLET | ORAL | 0 refills | Status: DC | PRN
  Filled 2023-05-20: qty 15, 3d supply, fill #0

## 2023-05-20 MED ORDER — SENNOSIDES-DOCUSATE SODIUM 8.6-50 MG PO TABS: 2.0000 | ORAL_TABLET | Freq: Every day | ORAL | 0 refills | Status: DC

## 2023-05-20 NOTE — Plan of Care (Signed)
  Problem: Education: Goal: Knowledge of General Education information will improve Description: Including pain rating scale, medication(s)/side effects and non-pharmacologic comfort measures Outcome: Adequate for Discharge   Problem: Health Behavior/Discharge Planning: Goal: Ability to manage health-related needs will improve Outcome: Adequate for Discharge   Problem: Clinical Measurements: Goal: Ability to maintain clinical measurements within normal limits will improve Outcome: Adequate for Discharge Goal: Will remain free from infection Outcome: Adequate for Discharge Goal: Diagnostic test results will improve Outcome: Adequate for Discharge Goal: Respiratory complications will improve Outcome: Adequate for Discharge Goal: Cardiovascular complication will be avoided Outcome: Adequate for Discharge   Problem: Activity: Goal: Risk for activity intolerance will decrease Outcome: Adequate for Discharge   Problem: Nutrition: Goal: Adequate nutrition will be maintained Outcome: Adequate for Discharge   Problem: Coping: Goal: Level of anxiety will decrease Outcome: Adequate for Discharge   Problem: Elimination: Goal: Will not experience complications related to bowel motility Outcome: Adequate for Discharge Goal: Will not experience complications related to urinary retention Outcome: Adequate for Discharge   Problem: Pain Management: Goal: General experience of comfort will improve Outcome: Adequate for Discharge   Problem: Safety: Goal: Ability to remain free from injury will improve Outcome: Adequate for Discharge   Problem: Skin Integrity: Goal: Risk for impaired skin integrity will decrease Outcome: Adequate for Discharge   Problem: Education: Goal: Knowledge of the prescribed therapeutic regimen will improve Outcome: Adequate for Discharge Goal: Understanding of sexual limitations or changes related to disease process or condition will improve Outcome:  Adequate for Discharge Goal: Individualized Educational Video(s) Outcome: Adequate for Discharge   Problem: Self-Concept: Goal: Communication of feelings regarding changes in body function or appearance will improve Outcome: Adequate for Discharge   Problem: Skin Integrity: Goal: Demonstration of wound healing without infection will improve Outcome: Adequate for Discharge   Problem: Education: Goal: Knowledge of General Education information will improve Description: Including pain rating scale, medication(s)/side effects and non-pharmacologic comfort measures Outcome: Adequate for Discharge   Problem: Health Behavior/Discharge Planning: Goal: Ability to manage health-related needs will improve Outcome: Adequate for Discharge   Problem: Clinical Measurements: Goal: Ability to maintain clinical measurements within normal limits will improve Outcome: Adequate for Discharge Goal: Will remain free from infection Outcome: Adequate for Discharge Goal: Diagnostic test results will improve Outcome: Adequate for Discharge Goal: Respiratory complications will improve Outcome: Adequate for Discharge Goal: Cardiovascular complication will be avoided Outcome: Adequate for Discharge   Problem: Activity: Goal: Risk for activity intolerance will decrease Outcome: Adequate for Discharge   Problem: Nutrition: Goal: Adequate nutrition will be maintained Outcome: Adequate for Discharge   Problem: Coping: Goal: Level of anxiety will decrease Outcome: Adequate for Discharge   Problem: Elimination: Goal: Will not experience complications related to bowel motility Outcome: Adequate for Discharge Goal: Will not experience complications related to urinary retention Outcome: Adequate for Discharge   Problem: Pain Management: Goal: General experience of comfort will improve Outcome: Adequate for Discharge   Problem: Safety: Goal: Ability to remain free from injury will improve Outcome:  Adequate for Discharge   Problem: Skin Integrity: Goal: Risk for impaired skin integrity will decrease Outcome: Adequate for Discharge

## 2023-05-20 NOTE — Progress Notes (Signed)
GYN Oncology Progress Note  5 Days Post-Op Procedure(s) (LRB): TOTAL HYSTERECTOMY ABDOMINAL WITH SALPINGO-OOPHORECTOMY, LEFT URETEROLYSIS , LYSIS OF ADHESIONS (Bilateral)  Subjective: Overall doing well. Has has ongoing flatus. No BM. Tolerating regular diet. No nausea/vomiting. Ambulating. Voiding without issue. Feels that her abdomen may be less distended than yesterday.   Objective: Vital signs in last 24 hours: Temp:  [98.2 F (36.8 C)-98.7 F (37.1 C)] 98.6 F (37 C) (11/17 0537) Pulse Rate:  [89-94] 92 (11/17 0537) Resp:  [16-18] 16 (11/17 0537) BP: (128-140)/(87-102) 133/98 (11/17 0537) SpO2:  [96 %-98 %] 98 % (11/17 0537) Last BM Date : 05/14/23  Intake/Output from previous day: 11/16 0701 - 11/17 0700 In: 120 [P.O.:120] Out: 900 [Urine:900]  Physical Examination: General: alert, cooperative, and no distress Resp: clear to auscultation bilaterally Cardio: regular rate and rhythm, S1, S2 normal, no murmur, click, rub or gallop GI: incision: midline abdominal incision with op site dressing in place with no drainage noted underneath and abdomen soft, slightly less distended, tympanic on percussion, normal bowel sounds. Extremities: extremities normal, atraumatic, no cyanosis or edema. SCDs not connected.  Labs:   BUN/Cr/glu/ALT/AST/amyl/lip:  21/1.06/--/--/--/--/-- (11/17 0510)  Assessment: 57 y.o. s/p Procedure(s): TOTAL HYSTERECTOMY ABDOMINAL WITH SALPINGO-OOPHORECTOMY, LEFT URETEROLYSIS , LYSIS OF ADHESIONS: stable Pain:  Pain is well-controlled on PRN medications.  Heme: Hgb has been stable. No recheck today.    ID: Given decadron intra-op along with IV antibiotic. No sign of infection at this time.  CV: BP and HR stable. Continue to monitor while inpatient.  GI:  Tolerating po: yes. Antiemetics ordered if needed. Awaiting bowel movement given distension despite flatus, although improved today. Plan suppository.   GU:  Mild increase in creatinine. Encourage  PO hydration. Adequate output reported. Voiding since foley removal.     FEN: BMP with improved sodium.  Prophylaxis: Lovenox and SCDs ordered.  Plan: Suppository today. Given increased flatus with some improvement in distension, likely discharge later today. Encourage PO hydration.  Continue plan of care   LOS: 5 days    Diana Harris 05/20/2023, 6:59 AM

## 2023-05-20 NOTE — Progress Notes (Signed)
Verbalized understanding of dc instructions including medications and follow up care. Criteria met for dc. Assessment unchanged. Discharged via wc to front entrance accompanied by NT.

## 2023-05-20 NOTE — Plan of Care (Signed)
°  Problem: Education: °Goal: Knowledge of General Education information will improve °Description: Including pain rating scale, medication(s)/side effects and non-pharmacologic comfort measures °Outcome: Progressing °  °Problem: Nutrition: °Goal: Adequate nutrition will be maintained °Outcome: Progressing °  °Problem: Skin Integrity: °Goal: Risk for impaired skin integrity will decrease °Outcome: Progressing °  °

## 2023-05-20 NOTE — Discharge Summary (Signed)
  Physician Discharge Summary  Patient ID: Diana Harris MRN: [id number] DOB/AGE: 11/04/1965 57 y.o.  Admit date: 05/15/2023 Discharge date: 05/20/2023  Admission Diagnoses: Pelvic mass in female  Discharge Diagnoses:  Principal Problem:   Pelvic mass in female Active Problems:   Pelvic mass   Discharged Condition:  The patient is in good condition and stable for discharge.   Hospital Course: On 05/15/2023, the patient underwent the following: Procedure(s): TOTAL HYSTERECTOMY ABDOMINAL WITH SALPINGO-OOPHORECTOMY, LEFT URETEROLYSIS , LYSIS OF ADHESIONS.  During the postoperative course she had bowel function with flatus but abdominal distension. By day of discharge she was able to have a bowel movement with improvement in distension. On day of discharge she was also noted to have white plaques on her tongue, likely consistent with thrush for which she was started on nystatin swish and spit. She was discharged to home on postoperative day 5 tolerating a regular diet, ambulating, voiding without issue and pain well controlled.   Consults: None  Significant Diagnostic Studies: labs  Treatments: surgery: as above  Discharge Exam: Blood pressure (!) 133/98, pulse 92, temperature 98.6 F (37 C), temperature source Oral, resp. rate 16, height 5\' 2"  (1.575 m), weight 142 lb (64.4 kg), SpO2 98%. General: alert, cooperative, and no distress Resp: clear to auscultation bilaterally Cardio: regular rate and rhythm, S1, S2 normal, no murmur, click, rub or gallop GI: incision: midline abdominal incision with op site dressing in place with no drainage noted underneath and abdomen soft, slightly less distended, tympanic on percussion, normal bowel sounds. Extremities: extremities normal, atraumatic, no cyanosis or edema. SCDs not connected.  Disposition:  Discharge disposition: 01-Home or Self Care        Allergies as of 05/20/2023   No Known Allergies      Medication List      TAKE these medications    aspirin 325 MG tablet Take 325 mg by mouth in the morning. What changed: Another medication with the same name was removed. Continue taking this medication, and follow the directions you see here.   atorvastatin 80 MG tablet Commonly known as: LIPITOR Take 1 tablet (80 mg total) by mouth daily.   ibuprofen 800 MG tablet Commonly known as: ADVIL Take 1 tablet (800 mg total) by mouth every 8 (eight) hours as needed (pain).   nystatin 100000 UNIT/ML suspension Commonly known as: MYCOSTATIN Take 5 mLs (500,000 Units total) by mouth 4 (four) times daily for 7 days. Swish around in mouth and spit out.   oxyCODONE 5 MG immediate release tablet Commonly known as: Oxy IR/ROXICODONE Take 1 tablet (5 mg total) by mouth every 4 (four) hours as needed for severe pain (pain score 7-10) or breakthrough pain.   senna-docusate 8.6-50 MG tablet Commonly known as: Senokot-S Take 2 tablets by mouth at bedtime.        Follow-up Information     Clide Cliff, MD Follow up on 05/28/2023.   Specialty: Gynecologic Oncology Why: at 4pm at the [facility] for post-op check Contact information: [address] [place] [address] [phone]                 Greater than thirty minutes were spend for face to face discharge instructions and discharge orders/summary in EPIC.   SignedClide Cliff 05/20/2023, 11:25 AM

## 2023-05-20 NOTE — Progress Notes (Signed)
Mobility Specialist - Progress Note   05/20/23 0928  Mobility  Activity Ambulated independently in hallway  Level of Assistance Independent  Assistive Device None  Distance Ambulated (ft) 250 ft  Activity Response Tolerated well  Mobility Referral Yes  $Mobility charge 1 Mobility  Mobility Specialist Start Time (ACUTE ONLY) [id number]  Mobility Specialist Stop Time (ACUTE ONLY) 0911  Mobility Specialist Time Calculation (min) (ACUTE ONLY) 5 min   Pt received in bed and agreeable to mobility. No complaints during session. Pt to bed after session with all needs met.    [facility]

## 2023-05-21 ENCOUNTER — Telehealth: Payer: Self-pay | Admitting: *Deleted

## 2023-05-21 ENCOUNTER — Other Ambulatory Visit (HOSPITAL_COMMUNITY): Payer: Self-pay

## 2023-05-21 ENCOUNTER — Encounter: Payer: Medicaid Other | Admitting: Psychiatry

## 2023-05-21 LAB — SURGICAL PATHOLOGY

## 2023-05-21 NOTE — Telephone Encounter (Signed)
Spoke with Ms. Diana Harris this morning. She states she is eating, drinking and urinating well. She has had a BM and is passing gas. She is taking senokot as prescribed and encouraged her to drink plenty of water. She denies fever or chills. Incisions are dry and intact. She rates her pain 5/10. Her pain is controlled with oxycodone.    Instructed to call office with any fever, chills, purulent drainage, uncontrolled pain or any other questions or concerns. Patient verbalizes understanding.   Pt aware of post op appointments as well as the office number [phone] and after hours number [phone] to call if she has any questions or concerns

## 2023-05-28 ENCOUNTER — Inpatient Hospital Stay: Payer: Medicaid Other | Attending: Psychiatry | Admitting: Psychiatry

## 2023-05-28 ENCOUNTER — Encounter: Payer: Self-pay | Admitting: Psychiatry

## 2023-05-28 VITALS — BP 122/79 | HR 71 | Temp 97.8°F | Resp 20 | Wt 124.4 lb

## 2023-05-28 DIAGNOSIS — Z90710 Acquired absence of both cervix and uterus: Secondary | ICD-10-CM

## 2023-05-28 DIAGNOSIS — D259 Leiomyoma of uterus, unspecified: Secondary | ICD-10-CM

## 2023-05-28 DIAGNOSIS — R1900 Intra-abdominal and pelvic swelling, mass and lump, unspecified site: Secondary | ICD-10-CM

## 2023-05-28 DIAGNOSIS — Z90722 Acquired absence of ovaries, bilateral: Secondary | ICD-10-CM

## 2023-05-28 NOTE — Progress Notes (Signed)
Gynecologic Oncology Return Clinic Visit  Date of Service: 05/28/2023 Referring Provider: Nettie Elm, MD   Assessment & Plan: Diana Harris is a 57 y.o. woman with a large left ovarian mass who is s/p TAH, BSO, left ureterolysis, lysis of adhesions on 05/15/23, final pathology benign.  Postop: - Pt recovering well from surgery and healing appropriately postoperatively - Intraoperative findings and pathology results reviewed. - Ongoing postoperative expectations and precautions reviewed. Continue with no lifting >10lbs through 6 weeks postoperatively - Okay to return to routine care.    RTC PRN.  Clide Cliff, MD Gynecologic Oncology    ----------------------- Reason for Visit: Postop  Interval History: Pt reports that she is recovering well from surgery. She is using ibuprofen PRN for pain. She is eating and drinking well. She is voiding without issue and having regular bowel movements. No bleeding.   Past Medical/Surgical History: Past Medical History:  Diagnosis Date   Hepatitis C    Ischemic stroke (HCC) 08/22/2017   Right posterior occipital ischemic stroke/notes 08/22/2017   Substance abuse (HCC)    ETOH, THC   Tobacco abuse     Past Surgical History:  Procedure Laterality Date   CESAREAN SECTION  1985   HYSTERECTOMY ABDOMINAL WITH SALPINGO-OOPHORECTOMY Bilateral 05/15/2023   Procedure: TOTAL HYSTERECTOMY ABDOMINAL WITH SALPINGO-OOPHORECTOMY, LEFT URETEROLYSIS , LYSIS OF ADHESIONS;  Surgeon: Clide Cliff, MD;  Location: [facility];  Service: Gynecology;  Laterality: Bilateral;   NO PAST SURGERIES      Family History  Problem Relation Age of Onset   Hypertension Mother    Hypertension Father    Breast cancer Sister    Prostate cancer Brother    Lung cancer Brother    Brain cancer Brother    Colon cancer Neg Hx    Ovarian cancer Neg Hx    Endometrial cancer Neg Hx    Pancreatic cancer Neg Hx     Social History   Socioeconomic History    Marital status: Single    Spouse name: Not on file   Number of children: Not on file   Years of education: Not on file   Highest education level: Not on file  Occupational History   Not on file  Tobacco Use   Smoking status: Every Day    Current packs/day: 0.25    Average packs/day: 0.3 packs/day for 40.0 years (10.0 ttl pk-yrs)    Types: Cigarettes   Smokeless tobacco: Never   Tobacco comments:    Trying to cut back   Vaping Use   Vaping status: Never Used  Substance and Sexual Activity   Alcohol use: Yes    Alcohol/week: 12.0 standard drinks of alcohol    Types: 12 Cans of beer per week   Drug use: Not Currently    Types: "Crack" cocaine    Comment: Hx of cocaine use (smoked)   Sexual activity: Yes    Partners: Male  Other Topics Concern   Not on file  Social History Narrative   Not on file   Social Determinants of Health   Financial Resource Strain: Not on file  Food Insecurity: No Food Insecurity (05/15/2023)   Hunger Vital Sign    Worried About Running Out of Food in the Last Year: Never true    Ran Out of Food in the Last Year: Never true  Recent Concern: Food Insecurity - Food Insecurity Present (04/04/2023)   Hunger Vital Sign    Worried About Running Out of Food in the Last Year: Often  true    Ran Out of Food in the Last Year: Often true  Transportation Needs: No Transportation Needs (05/15/2023)   PRAPARE - Administrator, Civil Service (Medical): No    Lack of Transportation (Non-Medical): No  Physical Activity: Not on file  Stress: Not on file  Social Connections: Not on file    Current Medications:  Current Outpatient Medications:    aspirin 325 MG tablet, Take 325 mg by mouth in the morning., Disp: , Rfl:    atorvastatin (LIPITOR) 80 MG tablet, Take 1 tablet (80 mg total) by mouth daily., Disp: 90 tablet, Rfl: 3   ibuprofen (ADVIL) 800 MG tablet, Take 1 tablet (800 mg total) by mouth every 8 (eight) hours as needed (pain)., Disp: 21  tablet, Rfl: 0   oxyCODONE (OXY IR/ROXICODONE) 5 MG immediate release tablet, Take 1 tablet (5 mg total) by mouth every 4 (four) hours as needed for severe pain (pain score 7-10) or breakthrough pain., Disp: 15 tablet, Rfl: 0  Review of Symptoms: Complete 10-system review is negative except as above in Interval History.  Physical Exam: BP 122/79 (BP Location: Left Arm, Patient Position: Sitting)   Pulse 71   Temp 97.8 F (36.6 C) (Oral)   Resp 20   Wt 124 lb 6.4 oz (56.4 kg)   SpO2 100%   BMI 22.75 kg/m  General: Alert, oriented, no acute distress. HEENT: Normocephalic, atraumatic. Neck symmetric without masses. Sclera anicteric.  Chest: Normal work of breathing. Clear to auscultation bilaterally.   Cardiovascular: Regular rate and rhythm, no murmurs. Abdomen: Soft, nontender.  Normoactive bowel sounds.  No masses appreciated.  Well-healing incision with glue. Extremities: Grossly normal range of motion.  Warm, well perfused.  No edema bilaterally. Skin: No rashes or lesions noted. GU: Normal appearing external genitalia without erythema, excoriation, or lesions.  Speculum exam reveals well healing vaginal cuff.  Bimanual exam reveals intact cuff. Exam chaperoned by Kimberly [place], CMA   Laboratory & Radiologic Studies: Surgical pathology (05/15/23): A. OVARY AND FALLOPIAN TUBE, LEFT, SALPINGO OOPHORECTOMY:  - Ovary: Fibroma.  See comment.  - Fallopian tube: No significant pathologic changes   B. UTERUS, CERVIX, RIGHT FALLOPIAN TUBE AND OVARY, RESECTION:  - Cervix: Benign, nabothian cyst  - Endometrium: Benign, no hyperplasia or malignancy identified  - Myometrium: Leiomyomata  - Right tube and ovary: Benign, no significant pathologic changes   COMMENT:   Selected slides from part A were reviewed with Dr. Luisa Hart who agrees  with the diagnosis of ovarian fibroma.   Cytology (05/15/23): FINAL MICROSCOPIC DIAGNOSIS:  - No malignant cells identified

## 2023-05-28 NOTE — Patient Instructions (Signed)
It was a pleasure to see you in clinic today. - No lifting >10lb until 6 weeks after surgery - Nothing in the vagina until 10 weeks after surgery. - Return to routine care.  Thank you very much for allowing me to provide care for you today.  I appreciate your confidence in choosing our Gynecologic Oncology team at [facility].  If you have any questions about your visit today please call our office or send [place] a MyChart message and we will get back to you as soon as possible.

## 2023-06-12 ENCOUNTER — Ambulatory Visit (INDEPENDENT_AMBULATORY_CARE_PROVIDER_SITE_OTHER): Payer: Medicaid Other | Admitting: Podiatry

## 2023-06-12 DIAGNOSIS — M86671 Other chronic osteomyelitis, right ankle and foot: Secondary | ICD-10-CM

## 2023-06-12 DIAGNOSIS — M2041 Other hammer toe(s) (acquired), right foot: Secondary | ICD-10-CM | POA: Diagnosis not present

## 2023-06-12 NOTE — Progress Notes (Signed)
Subjective:  Patient ID: Diana Harris, female    DOB: [birth year],  MRN: [id number]  Chief Complaint  Patient presents with   Foot Pain    RM#23 Follow up on right foot pain had MRI 05/06/2023 patient states is doing better minimal pain.    57 y.o. female presents for follow-up of right third toe swelling and pain.  Was sent for MRI which she had completed on May 06, 2023.  There was concern for possible osteomyelitis on the MRI patient is here to go over the results.  She does state that the toe seems to fluctuate in terms of edema and pain does not hurt as badly today.  She does have some pain when I press on it however she reports there was never a significant open wound however she did stop the toe very badly approximately 2 years ago and since then has had significant issues including chronic pain fluctuating swelling and redness over the third toe on the right foot  Past Medical History:  Diagnosis Date   Hepatitis C    Ischemic stroke (HCC) 08/22/2017   Right posterior occipital ischemic stroke/notes 08/22/2017   Substance abuse (HCC)    ETOH, THC   Tobacco abuse     No Known Allergies  ROS: Negative except as per HPI above  Objective:  General: AAO x3, NAD  Dermatological: No open wound present on the right third toe however there is significant edema of the right third toe and some dorsal darkening.  Pain on palpation of the distal aspect of the digit.  Vascular:  Dorsalis Pedis artery and Posterior Tibial artery pedal pulses are 2/4 bilateral.  Capillary fill time < 3 sec to all digits.   Neruologic: Grossly intact via light touch bilateral. Protective threshold intact to all sites bilateral.   Musculoskeletal: Decreased edema from prior of the right third toe still with some edema however there is discoloration of the toes as well  Gait: Unassisted, Nonantalgic.   No images are attached to the encounter.  Date: 03/27/2023 Radiographs: Focal bony erosion along  the lateral base of the distal phalanx of the third toe possible osteomyelitis  MRI R toes 05/06/2023 1. Osteomyelitis of the distal and middle phalanges of the third toe. 2. Notable effusion of the distal interphalangeal joint of the third toe. Septic joint not excluded. 3. Diffuse subcutaneous edema in the third toe.     Assessment:   1. Other chronic osteomyelitis of right foot (HCC)   2. Hammer toe of right foot       Plan:  Patient was evaluated and treated and all questions answered.  # Right third toe edema and pain concern for possible osteomyelitis -Recommend proceeding with ESR and CRP lab testing to evaluate for possible bone infection provided orders for this -MRI of the foot completed.  Discussed results with the patient -Discussed concern for osteomyelitis.  Patient did not get ESR CRP done previously will resend and get this done to evaluate if elevated inflammatory markers we will have to proceed as if this is osteomyelitis likely a chronic osteo of the third toe -Monitor off antibiotics at this time -Will discuss patient is facing third toe amputation and she is agreeable to this if needed however we will wait to see results of the inflammatory markers and determine a plan from there I will call the patient with the results         Corinna Gab, DPM [facility] /  [facility]

## 2023-08-19 NOTE — Progress Notes (Signed)
    SUBJECTIVE:   CHIEF COMPLAINT / HPI:   Med refills Taking aspirin 325mg  daily for secondary stroke prevention Also taking lipitor 80mg  daily  Smoking 1/2 ppd Interested in meeting w/ Dr. Raymondo Band for cessation - interested in meds as well Has tried to quit in the past without success. Has not tried patches or meds before  PERTINENT  PMH / PSH: Ischemic stroke 2019, tobacco use  OBJECTIVE:   BP 112/70   Pulse 69   Wt 133 lb (60.3 kg)   SpO2 98%   BMI 24.33 kg/m   General: NAD, pleasant, able to participate in exam Cardiac: RRR, no murmurs auscultated Respiratory: CTAB, normal WOB Abdomen: soft, non-tender, non-distended, normoactive bowel sounds Extremities: warm and well perfused, no edema or cyanosis Skin: warm and dry, no rashes noted Neuro: alert, no obvious focal deficits, speech normal Psych: Normal affect and mood  ASSESSMENT/PLAN:   Assessment & Plan History of stroke Stable on aspirin and Lipitor for secondary prevention.  Refilled medications today.  Check lipid panel and A1c  A1c 4.8 today Tobacco use disorder Patient interested in quitting smoking.  Has been smoking half a pack per day for many years.  Set up an appointment with Dr. Raymondo Band on 2/26 as she is interested in medication assistance for this.  Ordered colonoscopy and mammogram today Received flu shot today as well as pneumococcal vaccine.  Provided Rx for Shingrix  Vonna Drafts, MD [facility]

## 2023-08-20 ENCOUNTER — Ambulatory Visit: Payer: Medicaid Other | Admitting: Family Medicine

## 2023-08-20 ENCOUNTER — Encounter: Payer: Self-pay | Admitting: Family Medicine

## 2023-08-20 VITALS — BP 112/70 | HR 69 | Wt 133.0 lb

## 2023-08-20 DIAGNOSIS — Z8673 Personal history of transient ischemic attack (TIA), and cerebral infarction without residual deficits: Secondary | ICD-10-CM

## 2023-08-20 DIAGNOSIS — I639 Cerebral infarction, unspecified: Secondary | ICD-10-CM

## 2023-08-20 DIAGNOSIS — Z23 Encounter for immunization: Secondary | ICD-10-CM | POA: Diagnosis present

## 2023-08-20 DIAGNOSIS — F17200 Nicotine dependence, unspecified, uncomplicated: Secondary | ICD-10-CM

## 2023-08-20 DIAGNOSIS — Z1231 Encounter for screening mammogram for malignant neoplasm of breast: Secondary | ICD-10-CM | POA: Diagnosis not present

## 2023-08-20 DIAGNOSIS — Z1211 Encounter for screening for malignant neoplasm of colon: Secondary | ICD-10-CM | POA: Diagnosis not present

## 2023-08-20 LAB — POCT GLYCOSYLATED HEMOGLOBIN (HGB A1C): Hemoglobin A1C: 4.8 % (ref 4.0–5.6)

## 2023-08-20 MED ORDER — ATORVASTATIN CALCIUM 80 MG PO TABS: 80.0000 mg | ORAL_TABLET | Freq: Every day | ORAL | 3 refills | Status: DC

## 2023-08-20 MED ORDER — SHINGRIX 50 MCG/0.5ML IM SUSR: INTRAMUSCULAR | 1 refills | Status: DC

## 2023-08-20 MED ORDER — SHINGRIX 50 MCG/0.5ML IM SUSR
INTRAMUSCULAR | 1 refills | Status: AC
Start: 2023-08-20 — End: ?

## 2023-08-20 MED ORDER — ASPIRIN 325 MG PO TABS: 325.0000 mg | ORAL_TABLET | Freq: Every morning | ORAL | 3 refills | Status: DC

## 2023-08-20 NOTE — Patient Instructions (Signed)
I will let you know if any results from today are abnormal  Please see the attached sheet for information about how to schedule your mammogram  You should get a call soon about scheduling a colonoscopy  I have scheduled you with Dr. Raymondo Band on 2/26 to talk about smoking cessation

## 2023-08-20 NOTE — Assessment & Plan Note (Signed)
Patient interested in quitting smoking.  Has been smoking half a pack per day for many years.  Set up an appointment with Dr. Raymondo Band on 2/26 as she is interested in medication assistance for this.

## 2023-08-21 ENCOUNTER — Encounter: Payer: Self-pay | Admitting: Family Medicine

## 2023-08-21 LAB — LIPID PANEL
Chol/HDL Ratio: 2.3 ratio (ref 0.0–4.4)
Cholesterol, Total: 129 mg/dL (ref 100–199)
HDL: 56 mg/dL
LDL Chol Calc (NIH): 57 mg/dL (ref 0–99)
Triglycerides: 85 mg/dL (ref 0–149)
VLDL Cholesterol Cal: 16 mg/dL (ref 5–40)

## 2023-08-27 ENCOUNTER — Telehealth: Payer: Self-pay

## 2023-08-27 DIAGNOSIS — I639 Cerebral infarction, unspecified: Secondary | ICD-10-CM

## 2023-08-27 DIAGNOSIS — Z8673 Personal history of transient ischemic attack (TIA), and cerebral infarction without residual deficits: Secondary | ICD-10-CM

## 2023-08-27 MED ORDER — ATORVASTATIN CALCIUM 80 MG PO TABS: 80.0000 mg | ORAL_TABLET | Freq: Every day | ORAL | 3 refills | Status: AC

## 2023-08-27 MED ORDER — ASPIRIN 325 MG PO TABS: 325.0000 mg | ORAL_TABLET | Freq: Every morning | ORAL | 3 refills | Status: AC

## 2023-08-27 NOTE — Telephone Encounter (Signed)
 Patient calls nurse line regarding prescriptions for aspirin and atorvastatin. She reports that she needs prescriptions transferred to [facility] on [address].   Cancelled prescriptions at [facility]. Resent to preferred [facility].   Veronda Prude, RN

## 2023-08-29 ENCOUNTER — Ambulatory Visit: Payer: Medicaid Other | Admitting: Pharmacist

## 2023-10-01 ENCOUNTER — Telehealth: Payer: Self-pay | Admitting: Pharmacist

## 2023-10-01 NOTE — Telephone Encounter (Signed)
 Patient contacted for follow/up of tobacco intake reduction / cessation attempt.   Since last appointment patient reports reducing cigarette intake from > 10 to 5 cigarettes per day.   Medications currently being used; alternating between nicotine patches and gum - unsure of dose  Patient denies any significant side effects from tobacco cessation therapy.   Provided information on 1 800-QUIT NOW support program.  Patient is participating in a Managed Medicaid Plan:  Yes  Total time with patient call and documentation of interaction: 4 minutes. Follow-up phone call planned: end of April

## 2023-10-02 NOTE — Telephone Encounter (Signed)
 Reviewed and agree with Dr Macky Lower plan.

## 2023-10-29 ENCOUNTER — Encounter: Payer: Self-pay | Admitting: Gastroenterology

## 2023-10-29 ENCOUNTER — Telehealth: Payer: Self-pay | Admitting: Pharmacist

## 2023-10-29 NOTE — Telephone Encounter (Signed)
 Patient contacted for follow/up of tobacco intake reduction / cessation attempt.   Since last contact patient reports a loss in the family and a period of time that she increased intake due to stress/grief.   She reports smoking 5-6 cigarettes per day at this time which is near goal of 5 or less that we set at last contact for today.   Medications currently being used;  Nicotine  Gum - PRN craving NOT currently using, but was using with success prior to loss of family member.  Willing to restart as part of plan to reduce intake further with goal of quitting.   Patient also complained of productive cough AND back pain, both of which have been going on for > 1 week.  She requested a visit to be seen for evaluation.  Appointment scheduled for PCP later this week.   Patient denies any significant side effects from tobacco cessation therapy.   Plan to contact her again in 3-4 weeks to evaluate progress toward quitting.   Total time with patient call and documentation of interaction: 12 minutes.

## 2023-10-29 NOTE — Telephone Encounter (Signed)
-----   Message from Cristal Don sent at 10/01/2023  2:04 PM EDT ----- Regarding: Tobacco Intake Reduction - Quit Reduced to 5 cigs per day as of 10/01/2023

## 2023-11-02 ENCOUNTER — Ambulatory Visit: Admitting: Family Medicine

## 2023-11-02 NOTE — Progress Notes (Deleted)
    SUBJECTIVE:   CHIEF COMPLAINT / HPI:   Cough and back pain ***  PERTINENT  PMH / PSH: ***  OBJECTIVE:   There were no vitals taken for this visit.  ***  ASSESSMENT/PLAN:   Assessment & Plan    Edison Gore, MD [facility]

## 2023-11-28 ENCOUNTER — Ambulatory Visit

## 2023-11-28 ENCOUNTER — Telehealth: Payer: Self-pay | Admitting: *Deleted

## 2023-11-28 NOTE — Telephone Encounter (Signed)
 Attempt to reach pt for pre-visit.  "Mail box full can't except any messages at this time"   Will attempt to reach again in 5 min due to no other # listed in profile  Second attempt to reach pt for pre-vist unsuccessful.  Reached same message.

## 2023-11-30 ENCOUNTER — Telehealth: Payer: Self-pay | Admitting: Pharmacist

## 2023-11-30 NOTE — Telephone Encounter (Signed)
-----   Message from Cristal Don sent at 11/21/2023  3:33 PM EDT ----- Regarding: FW: tobacco intake reduction vs Quit  ----- Message ----- From: Shali Vesey G, RPH-CPP Sent: 11/19/2023  12:00 AM EDT To: Abbey Abbe, RPH-CPP Subject: tobacco intake reduction vs Quit               Smoking 5-6 per day and plans to restart gum on 10/29/2023

## 2023-11-30 NOTE — Telephone Encounter (Signed)
 Attempted to contact patient for follow-up of tobacco intake reduction / cessation.   No answer - multiple attempts.   Total time with patient call and documentation of interaction: 2 minutes.  Additional follow-up phone call planned: 2 weeks

## 2023-12-12 ENCOUNTER — Encounter: Admitting: Gastroenterology

## 2024-01-03 ENCOUNTER — Telehealth: Payer: Self-pay | Admitting: Pharmacist

## 2024-01-03 NOTE — Telephone Encounter (Signed)
 Attempted to contact patient for follow-up of tobacco cessation    Unable to leave message  Total time with patient call and documentation of interaction: 3 minutes. Patient has no visits scheduled.   Follow-up phone call planned: 2-4 weeks

## 2024-01-03 NOTE — Telephone Encounter (Signed)
-----   Message from Maude Lagos sent at 11/30/2023  2:52 PM EDT ----- Regarding: FW: tobacco intake reduction vs Quit Phone f/u? ----- Message ----- From: Tasheena Wambolt G, RPH-CPP Sent: 11/28/2023  12:00 AM EDT To: Maude KANDICE Lagos, RPH-CPP Subject: FW: tobacco intake reduction vs Quit            ----- Message ----- From: Bashar Milam G, RPH-CPP Sent: 11/19/2023  12:00 AM EDT To: Maude KANDICE Lagos, RPH-CPP Subject: tobacco intake reduction vs Quit               Smoking 5-6 per day and plans to restart gum on 10/29/2023

## 2024-02-25 ENCOUNTER — Telehealth: Payer: Self-pay | Admitting: Pharmacist

## 2024-02-25 NOTE — Telephone Encounter (Signed)
 Patient contacted for follow-up of tobacco cessation follow-up.  Phone number is out of service.   Total time with patient call and documentation of interaction: 3 minutes.  No follow-up phone call planned. Available to schedule appointment if patient requests.

## 2024-08-08 ENCOUNTER — Emergency Department (HOSPITAL_COMMUNITY)

## 2024-08-08 ENCOUNTER — Emergency Department (HOSPITAL_COMMUNITY)
Admission: EM | Admit: 2024-08-08 | Disposition: A | Discharge status: Home / Self Care | Source: Home / Self Care | Attending: Emergency Medicine | Admitting: Emergency Medicine

## 2024-08-08 ENCOUNTER — Other Ambulatory Visit: Payer: Self-pay

## 2024-08-08 DIAGNOSIS — R55 Syncope and collapse: Secondary | ICD-10-CM

## 2024-08-08 LAB — COMPREHENSIVE METABOLIC PANEL WITH GFR
ALT: 16 U/L (ref 0–44)
AST: 22 U/L (ref 15–41)
Albumin: 3.9 g/dL (ref 3.5–5.0)
Alkaline Phosphatase: 74 U/L (ref 38–126)
Anion gap: 14 (ref 5–15)
BUN: 16 mg/dL (ref 6–20)
CO2: 18 mmol/L — ABNORMAL LOW (ref 22–32)
Calcium: 9 mg/dL (ref 8.9–10.3)
Chloride: 106 mmol/L (ref 98–111)
Creatinine, Ser: 0.58 mg/dL (ref 0.44–1.00)
GFR, Estimated: >60 mL/min
Glucose, Bld: 80 mg/dL (ref 70–99)
Potassium: 3.7 mmol/L (ref 3.5–5.1)
Sodium: 139 mmol/L (ref 135–145)
Total Bilirubin: 0.4 mg/dL (ref 0.0–1.2)
Total Protein: 6.7 g/dL (ref 6.5–8.1)

## 2024-08-08 LAB — CBC
HCT: 39.7 % (ref 36.0–46.0)
Hemoglobin: 13.7 g/dL (ref 12.0–15.0)
MCH: 32.8 pg (ref 26.0–34.0)
MCHC: 34.5 g/dL (ref 30.0–36.0)
MCV: 95 fL (ref 80.0–100.0)
Platelets: 200 10*3/uL (ref 150–400)
RBC: 4.18 MIL/uL (ref 3.87–5.11)
RDW: 13.2 % (ref 11.5–15.5)
WBC: 5.9 10*3/uL (ref 4.0–10.5)
nRBC: 0 % (ref 0.0–0.2)

## 2024-08-08 LAB — ETHANOL: Alcohol, Ethyl (B): <15 mg/dL

## 2024-08-08 LAB — TROPONIN T, HIGH SENSITIVITY: Troponin T High Sensitivity: <6 ng/L (ref 0–19)

## 2024-08-08 LAB — MAGNESIUM: Magnesium: 1.8 mg/dL (ref 1.7–2.4)

## 2024-08-08 MED ORDER — SODIUM CHLORIDE 0.9 % IV BOLUS
1000.0000 mL | Freq: Once | INTRAVENOUS | Status: AC
  Administered 2024-08-08: 1000 mL via INTRAVENOUS

## 2024-08-08 MED ORDER — PROCHLORPERAZINE EDISYLATE 10 MG/2ML IJ SOLN
10.0000 mg | Freq: Once | INTRAMUSCULAR | Status: AC
  Administered 2024-08-08: 10 mg via INTRAVENOUS
  Filled 2024-08-08: qty 2

## 2024-08-08 MED ORDER — DIPHENHYDRAMINE HCL 50 MG/ML IJ SOLN
25.0000 mg | Freq: Once | INTRAMUSCULAR | Status: AC
  Administered 2024-08-08: 25 mg via INTRAVENOUS
  Filled 2024-08-08: qty 1

## 2024-08-08 NOTE — Discharge Instructions (Signed)
 You were evaluated in the Emergency Department and after careful evaluation, we did not find any emergent condition requiring admission or further testing in the hospital.  Your exam/testing today is overall reassuring.  Avoid drugs and alcohol in the future.  Please return to the Emergency Department if you experience any worsening of your condition.   Thank you for allowing us  to be a part of your care.

## 2024-08-08 NOTE — ED Provider Notes (Signed)
 " [facility]PT [facility] Emergency Department Provider Note MRN:  [id number]  Arrival date & time: 08/08/24     Chief Complaint   syncope  History of Present Illness   Diana ROUGHTON is a 59 y.o. year-old female with a history of substance use disorder, stroke presenting to the ED with chief complaint of syncope.  Patient admits to drinking beer and using cocaine this evening.  She went to the bathroom and then does not really remember what happened.  Per reports she passed out.  Remembers feeling like she was going to pass out.  Reportedly had some soft blood pressure with EMS and was altered.  Feeling better now.  Complaining of a bad headache currently which is abnormal for her.  Denies numbness or weakness to the arms or legs, no chest pain or shortness of breath, no abdominal pain.  Review of Systems  A thorough review of systems was obtained and all systems are negative except as noted in the HPI and PMH.   Patient's Health History    Past Medical History:  Diagnosis Date   Hepatitis C    Ischemic stroke (HCC) 08/22/2017   Right posterior occipital ischemic stroke/notes 08/22/2017   Substance abuse (HCC)    ETOH, THC   Tobacco abuse     Past Surgical History:  Procedure Laterality Date   CESAREAN SECTION  1985   HYSTERECTOMY ABDOMINAL WITH SALPINGO-OOPHORECTOMY Bilateral 05/15/2023   Procedure: TOTAL HYSTERECTOMY ABDOMINAL WITH SALPINGO-OOPHORECTOMY, LEFT URETEROLYSIS , LYSIS OF ADHESIONS;  Surgeon: Eldonna Mays, MD;  Location: [facility];  Service: Gynecology;  Laterality: Bilateral;   NO PAST SURGERIES      Family History  Problem Relation Age of Onset   Hypertension Mother    Hypertension Father    Breast cancer Sister    Prostate cancer Brother    Lung cancer Brother    Brain cancer Brother    Colon cancer Neg Hx    Ovarian cancer Neg Hx    Endometrial cancer Neg Hx    Pancreatic cancer Neg Hx     Social History   Socioeconomic History    Marital status: Single    Spouse name: Not on file   Number of children: Not on file   Years of education: Not on file   Highest education level: Not on file  Occupational History   Not on file  Tobacco Use   Smoking status: Every Day    Current packs/day: 0.25    Average packs/day: 0.3 packs/day for 40.0 years (10.0 ttl pk-yrs)    Types: Cigarettes    Passive exposure: Current   Smokeless tobacco: Never   Tobacco comments:    Trying to cut back   Vaping Use   Vaping status: Never Used  Substance and Sexual Activity   Alcohol use: Yes    Alcohol/week: 12.0 standard drinks of alcohol    Types: 12 Cans of beer per week   Drug use: Not Currently    Types: Crack cocaine    Comment: Hx of cocaine use (smoked)   Sexual activity: Yes    Partners: Male  Other Topics Concern   Not on file  Social History Narrative   Not on file   Social Drivers of Health   Tobacco Use: High Risk (08/20/2023)   Patient History    Smoking Tobacco Use: Every Day    Smokeless Tobacco Use: Never    Passive Exposure: Current  Financial Resource Strain: Not on file  Food  Insecurity: No Food Insecurity (05/15/2023)   Hunger Vital Sign    Worried About Running Out of Food in the Last Year: Never true    Ran Out of Food in the Last Year: Never true  Recent Concern: Food Insecurity - Food Insecurity Present (04/04/2023)   Hunger Vital Sign    Worried About Running Out of Food in the Last Year: Often true    Ran Out of Food in the Last Year: Often true  Transportation Needs: No Transportation Needs (05/15/2023)   PRAPARE - Administrator, Civil Service (Medical): No    Lack of Transportation (Non-Medical): No  Physical Activity: Not on file  Stress: Not on file  Social Connections: Not on file  Intimate Partner Violence: Not At Risk (05/15/2023)   Humiliation, Afraid, Rape, and Kick questionnaire    Fear of Current or Ex-Partner: No    Emotionally Abused: No    Physically Abused: No     Sexually Abused: No  Depression (PHQ2-9): Low Risk (08/20/2023)   Depression (PHQ2-9)    PHQ-2 Score: 0  Alcohol Screen: Not on file  Housing: Low Risk (05/15/2023)   Housing    Last Housing Risk Score: 0  Utilities: Not At Risk (05/15/2023)   AHC Utilities    Threatened with loss of utilities: No  Health Literacy: Not on file     Physical Exam   Vitals:   08/08/24 0545 08/08/24 0600  BP: (!) 131/113 131/89  Pulse: 78 74  Resp: 20 17  Temp:    SpO2: 100% 99%    CONSTITUTIONAL: Well-appearing, NAD NEURO/PSYCH:  Alert and oriented x 3, no focal deficits EYES:  eyes equal and reactive ENT/NECK:  no LAD, no JVD CARDIO: Regular rate, well-perfused, normal S1 and S2 PULM:  CTAB no wheezing or rhonchi GI/GU:  non-distended, non-tender MSK/SPINE:  No gross deformities, no edema SKIN:  no rash, atraumatic   *Additional and/or pertinent findings included in MDM below  Diagnostic and Interventional Summary    EKG Interpretation Date/Time:  Friday August 08 2024 03:56:08 EST Ventricular Rate:  72 PR Interval:  157 QRS Duration:  79 QT Interval:  442 QTC Calculation: 484 R Axis:   69  Text Interpretation: Sinus rhythm Anteroseptal infarct, old Confirmed by Theadore Sharper [phone]) on 08/08/2024 6:02:42 AM       Labs Reviewed  COMPREHENSIVE METABOLIC PANEL WITH GFR - Abnormal; Notable for the following components:      Result Value   CO2 18 (*)    All other components within normal limits  CBC  ETHANOL  MAGNESIUM  TROPONIN T, HIGH SENSITIVITY    CT HEAD WO CONTRAST ( )  Final Result    DG Chest Port 1 View  Final Result      Medications  sodium chloride  0.9 % bolus 1,000 mL (1,000 mLs Intravenous New Bag/Given 08/08/24 0507)  diphenhydrAMINE  (BENADRYL ) injection 25 mg (25 mg Intravenous Given 08/08/24 0503)  prochlorperazine  (COMPAZINE ) injection 10 mg (10 mg Intravenous Given 08/08/24 0503)     Procedures  /  Critical Care Procedures  ED Course and  Medical Decision Making  Initial Impression and Ddx With syncope and headache, intracranial bleeding is considered, obtaining CT head.  Reassuring neurological exam and vital signs.  Syncopal episode could have been triggered by drug and alcohol use.  Past medical/surgical history that increases complexity of ED encounter: Substance use disorder  Interpretation of Diagnostics I personally reviewed the EKG and my interpretation is as follows: Sinus  rhythm without concerning features or arrhythmia  No significant blood count or electrolyte disturbance.  CT head unremarkable  Patient Reassessment and Ultimate Disposition/Management     Patient feeling better, vitals continue to be normal, no neurological deficits, no emergent process is evident, no indication for further testing or admission.  Plan is for discharge.  Patient management required discussion with the following services or consulting groups:  None  Complexity of Problems Addressed Acute illness or injury that poses threat of life of bodily function  Additional Data Reviewed and Analyzed Further history obtained from: Further history from spouse/family member  Additional Factors Impacting ED Encounter Risk Consideration of hospitalization  Ozell HERO. Theadore, MD [facility] [email or web address]  Final Clinical Impressions(s) / ED Diagnoses     ICD-10-CM   1. Syncope and collapse  R55       ED Discharge Orders     None        Discharge Instructions Discussed with and Provided to Patient:     Discharge Instructions      You were evaluated in the Emergency Department and after careful evaluation, we did not find any emergent condition requiring admission or further testing in the hospital.  Your exam/testing today is overall reassuring.  Avoid drugs and alcohol in the future.  Please return to the Emergency Department if you experience any worsening of your  condition.   Thank you for allowing us  to be a part of your care.       Theadore Ozell HERO, MD 08/08/24 [phone]  "

## 2024-08-08 NOTE — ED Triage Notes (Signed)
 Pt BIB [facility] from hotel. Pt states she got up to go to the bathroom and felt like she was going to pass out. Per EMS, pt was sitting against the wall upon arrival. Initial BP was 90/60 and pt was confused with a GCS of 8. Upon arrival pt is A&Ox3, disoriented to situation.
